# Patient Record
Sex: Female | Born: 1950 | Race: White | Hispanic: No | Marital: Married | Smoking: Former smoker
Health system: Southern US, Community
[De-identification: ages and names within clinical notes are randomized; demographics above are authoritative.]

## PROBLEM LIST (undated history)

## (undated) DIAGNOSIS — M5136 Other intervertebral disc degeneration, lumbar region: Secondary | ICD-10-CM

## (undated) DIAGNOSIS — R5382 Chronic fatigue, unspecified: Secondary | ICD-10-CM

## (undated) DIAGNOSIS — M1990 Unspecified osteoarthritis, unspecified site: Secondary | ICD-10-CM

## (undated) DIAGNOSIS — G8929 Other chronic pain: Secondary | ICD-10-CM

## (undated) DIAGNOSIS — F419 Anxiety disorder, unspecified: Secondary | ICD-10-CM

## (undated) DIAGNOSIS — I959 Hypotension, unspecified: Secondary | ICD-10-CM

## (undated) DIAGNOSIS — M545 Low back pain, unspecified: Secondary | ICD-10-CM

## (undated) DIAGNOSIS — Z973 Presence of spectacles and contact lenses: Secondary | ICD-10-CM

## (undated) DIAGNOSIS — I219 Acute myocardial infarction, unspecified: Secondary | ICD-10-CM

## (undated) DIAGNOSIS — K219 Gastro-esophageal reflux disease without esophagitis: Secondary | ICD-10-CM

## (undated) DIAGNOSIS — Z9289 Personal history of other medical treatment: Secondary | ICD-10-CM

## (undated) DIAGNOSIS — M47812 Spondylosis without myelopathy or radiculopathy, cervical region: Secondary | ICD-10-CM

## (undated) DIAGNOSIS — I679 Cerebrovascular disease, unspecified: Secondary | ICD-10-CM

## (undated) DIAGNOSIS — M51369 Other intervertebral disc degeneration, lumbar region without mention of lumbar back pain or lower extremity pain: Secondary | ICD-10-CM

## (undated) DIAGNOSIS — G894 Chronic pain syndrome: Secondary | ICD-10-CM

## (undated) DIAGNOSIS — I2510 Atherosclerotic heart disease of native coronary artery without angina pectoris: Secondary | ICD-10-CM

## (undated) DIAGNOSIS — R079 Chest pain, unspecified: Principal | ICD-10-CM

## (undated) DIAGNOSIS — G909 Disorder of the autonomic nervous system, unspecified: Secondary | ICD-10-CM

## (undated) DIAGNOSIS — E785 Hyperlipidemia, unspecified: Secondary | ICD-10-CM

## (undated) DIAGNOSIS — Z8719 Personal history of other diseases of the digestive system: Secondary | ICD-10-CM

## (undated) DIAGNOSIS — R7303 Prediabetes: Secondary | ICD-10-CM

## (undated) HISTORY — DX: Spondylosis without myelopathy or radiculopathy, cervical region: M47.812

## (undated) HISTORY — DX: Chronic pain syndrome: G89.4

## (undated) HISTORY — PX: LUMBAR LAMINECTOMY: SHX95

## (undated) HISTORY — DX: Hyperlipidemia, unspecified: E78.5

## (undated) HISTORY — DX: Hypotension, unspecified: I95.9

## (undated) HISTORY — DX: Disorder of the autonomic nervous system, unspecified: G90.9

## (undated) HISTORY — PX: FINGER ARTHROPLASTY: SHX5017

## (undated) HISTORY — DX: Unspecified osteoarthritis, unspecified site: M19.90

## (undated) HISTORY — DX: Other intervertebral disc degeneration, lumbar region without mention of lumbar back pain or lower extremity pain: M51.369

## (undated) HISTORY — DX: Other intervertebral disc degeneration, lumbar region: M51.36

## (undated) HISTORY — DX: Cerebrovascular disease, unspecified: I67.9

## (undated) HISTORY — PX: BACK SURGERY: SHX140

## (undated) HISTORY — PX: CORONARY ANGIOPLASTY WITH STENT PLACEMENT: SHX49

## (undated) HISTORY — PX: TYMPANOPLASTY: SHX33

---
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
## 1972-04-14 HISTORY — PX: PARTIAL HYSTERECTOMY: SHX80

## 1976-04-14 HISTORY — PX: BILATERAL SALPINGOOPHORECTOMY: SHX1223

## 1988-12-13 HISTORY — PX: LIGAMENT REPAIR: SHX5444

## 1988-12-13 HISTORY — PX: GANGLION CYST EXCISION: SHX1691

## 1988-12-13 HISTORY — PX: MASS EXCISION: SHX2000

## 1989-08-12 HISTORY — PX: CHOLECYSTECTOMY: SHX55

## 1997-04-14 DIAGNOSIS — I214 Non-ST elevation (NSTEMI) myocardial infarction: Secondary | ICD-10-CM

## 1997-04-14 HISTORY — DX: Non-ST elevation (NSTEMI) myocardial infarction: I21.4

## 1997-08-01 ENCOUNTER — Inpatient Hospital Stay (HOSPITAL_COMMUNITY): Admission: EM | Admit: 1997-08-01 | Discharge: 1997-08-03 | Payer: Self-pay | Admitting: Emergency Medicine

## 1997-08-02 DIAGNOSIS — I219 Acute myocardial infarction, unspecified: Secondary | ICD-10-CM

## 1997-08-02 HISTORY — DX: Acute myocardial infarction, unspecified: I21.9

## 1997-08-25 ENCOUNTER — Other Ambulatory Visit: Admission: RE | Admit: 1997-08-25 | Discharge: 1997-08-25 | Payer: Self-pay | Admitting: Otolaryngology

## 1997-09-11 ENCOUNTER — Inpatient Hospital Stay (HOSPITAL_COMMUNITY): Admission: EM | Admit: 1997-09-11 | Discharge: 1997-09-12 | Payer: Self-pay | Admitting: Emergency Medicine

## 1998-12-14 HISTORY — PX: CARDIAC CATHETERIZATION: SHX172

## 1999-06-26 ENCOUNTER — Inpatient Hospital Stay (HOSPITAL_COMMUNITY): Admission: EM | Admit: 1999-06-26 | Discharge: 1999-06-28 | Payer: Self-pay | Admitting: Emergency Medicine

## 1999-06-26 ENCOUNTER — Encounter: Payer: Self-pay | Admitting: Emergency Medicine

## 1999-06-27 ENCOUNTER — Encounter: Payer: Self-pay | Admitting: Cardiology

## 1999-08-06 ENCOUNTER — Emergency Department (HOSPITAL_COMMUNITY): Admission: EM | Admit: 1999-08-06 | Discharge: 1999-08-06 | Payer: Self-pay | Admitting: Emergency Medicine

## 1999-08-06 ENCOUNTER — Encounter: Payer: Self-pay | Admitting: Emergency Medicine

## 2000-07-08 ENCOUNTER — Ambulatory Visit (HOSPITAL_COMMUNITY): Admission: RE | Admit: 2000-07-08 | Discharge: 2000-07-08 | Payer: Self-pay | Admitting: Cardiology

## 2001-06-06 ENCOUNTER — Emergency Department (HOSPITAL_COMMUNITY): Admission: EM | Admit: 2001-06-06 | Discharge: 2001-06-06 | Payer: Self-pay | Admitting: Emergency Medicine

## 2001-06-30 ENCOUNTER — Encounter: Admission: RE | Admit: 2001-06-30 | Discharge: 2001-06-30 | Payer: Self-pay | Admitting: Neurosurgery

## 2001-06-30 ENCOUNTER — Encounter: Payer: Self-pay | Admitting: Neurosurgery

## 2001-07-26 ENCOUNTER — Encounter: Payer: Self-pay | Admitting: Neurosurgery

## 2001-07-26 ENCOUNTER — Encounter: Admission: RE | Admit: 2001-07-26 | Discharge: 2001-07-26 | Payer: Self-pay | Admitting: Neurosurgery

## 2001-08-10 ENCOUNTER — Encounter: Payer: Self-pay | Admitting: Neurosurgery

## 2001-08-10 ENCOUNTER — Encounter: Admission: RE | Admit: 2001-08-10 | Discharge: 2001-08-10 | Payer: Self-pay | Admitting: Neurosurgery

## 2001-08-31 ENCOUNTER — Encounter: Payer: Self-pay | Admitting: Neurosurgery

## 2001-08-31 ENCOUNTER — Encounter: Admission: RE | Admit: 2001-08-31 | Discharge: 2001-08-31 | Payer: Self-pay | Admitting: Neurosurgery

## 2002-02-12 HISTORY — PX: HERNIA REPAIR: SHX51

## 2002-02-14 ENCOUNTER — Encounter (HOSPITAL_BASED_OUTPATIENT_CLINIC_OR_DEPARTMENT_OTHER): Payer: Self-pay | Admitting: General Surgery

## 2002-02-17 ENCOUNTER — Inpatient Hospital Stay (HOSPITAL_COMMUNITY): Admission: RE | Admit: 2002-02-17 | Discharge: 2002-02-18 | Payer: Self-pay | Admitting: General Surgery

## 2002-04-14 HISTORY — PX: POSTERIOR LUMBAR FUSION: SHX6036

## 2002-05-11 ENCOUNTER — Inpatient Hospital Stay (HOSPITAL_COMMUNITY): Admission: RE | Admit: 2002-05-11 | Discharge: 2002-05-16 | Payer: Self-pay | Admitting: Orthopaedic Surgery

## 2002-05-11 ENCOUNTER — Encounter: Payer: Self-pay | Admitting: Orthopaedic Surgery

## 2003-12-22 ENCOUNTER — Ambulatory Visit (HOSPITAL_COMMUNITY): Admission: RE | Admit: 2003-12-22 | Discharge: 2003-12-22 | Payer: Self-pay | Admitting: Cardiology

## 2004-08-05 ENCOUNTER — Emergency Department (HOSPITAL_COMMUNITY): Admission: EM | Admit: 2004-08-05 | Discharge: 2004-08-05 | Payer: Self-pay | Admitting: Family Medicine

## 2004-12-13 ENCOUNTER — Emergency Department (HOSPITAL_COMMUNITY): Admission: EM | Admit: 2004-12-13 | Discharge: 2004-12-13 | Payer: Self-pay | Admitting: Emergency Medicine

## 2006-03-10 ENCOUNTER — Emergency Department (HOSPITAL_COMMUNITY): Admission: EM | Admit: 2006-03-10 | Discharge: 2006-03-10 | Payer: Self-pay | Admitting: Emergency Medicine

## 2006-11-21 ENCOUNTER — Emergency Department (HOSPITAL_COMMUNITY): Admission: EM | Admit: 2006-11-21 | Discharge: 2006-11-21 | Payer: Self-pay | Admitting: Family Medicine

## 2008-10-17 ENCOUNTER — Observation Stay (HOSPITAL_COMMUNITY): Admission: EM | Admit: 2008-10-17 | Discharge: 2008-10-18 | Payer: Self-pay | Admitting: Emergency Medicine

## 2008-10-17 ENCOUNTER — Encounter (INDEPENDENT_AMBULATORY_CARE_PROVIDER_SITE_OTHER): Payer: Self-pay | Admitting: Emergency Medicine

## 2008-10-31 ENCOUNTER — Inpatient Hospital Stay (HOSPITAL_COMMUNITY): Admission: EM | Admit: 2008-10-31 | Discharge: 2008-11-04 | Payer: Self-pay | Admitting: Emergency Medicine

## 2008-11-03 ENCOUNTER — Encounter (INDEPENDENT_AMBULATORY_CARE_PROVIDER_SITE_OTHER): Payer: Self-pay | Admitting: Cardiology

## 2009-03-20 ENCOUNTER — Encounter: Admission: RE | Admit: 2009-03-20 | Discharge: 2009-04-11 | Payer: Self-pay | Admitting: Endocrinology

## 2009-04-05 ENCOUNTER — Ambulatory Visit (HOSPITAL_BASED_OUTPATIENT_CLINIC_OR_DEPARTMENT_OTHER): Admission: RE | Admit: 2009-04-05 | Discharge: 2009-04-05 | Payer: Self-pay | Admitting: Orthopedic Surgery

## 2009-04-14 HISTORY — PX: GUM SURGERY: SHX658

## 2009-09-10 ENCOUNTER — Emergency Department (HOSPITAL_COMMUNITY): Admission: EM | Admit: 2009-09-10 | Discharge: 2009-09-11 | Payer: Self-pay | Admitting: Emergency Medicine

## 2010-07-15 LAB — BASIC METABOLIC PANEL
BUN: 15 mg/dL (ref 6–23)
CO2: 29 mEq/L (ref 19–32)
Calcium: 9.6 mg/dL (ref 8.4–10.5)
Chloride: 102 mEq/L (ref 96–112)
Creatinine, Ser: 0.82 mg/dL (ref 0.4–1.2)
GFR calc Af Amer: >60 mL/min (ref >60)
GFR calc non Af Amer: >60 mL/min (ref >60)
Glucose, Bld: 122 mg/dL — ABNORMAL HIGH (ref 70–99)
Potassium: 4.8 mEq/L (ref 3.5–5.1)
Sodium: 138 mEq/L (ref 135–145)

## 2010-07-15 LAB — POCT HEMOGLOBIN-HEMACUE: Hemoglobin: 11.9 g/dL — ABNORMAL LOW (ref 12.0–15.0)

## 2010-07-21 LAB — CARDIAC PANEL(CRET KIN+CKTOT+MB+TROPI)
CK, MB: 1.1 ng/mL (ref 0.3–4.0)
Relative Index: INVALID (ref 0.0–2.5)
Relative Index: INVALID (ref 0.0–2.5)
Total CK: 74 U/L (ref 7–177)
Troponin I: 0.01 ng/mL (ref 0.00–0.06)

## 2010-07-21 LAB — GLUCOSE, CAPILLARY
Glucose-Capillary: 102 mg/dL — ABNORMAL HIGH (ref 70–99)
Glucose-Capillary: 110 mg/dL — ABNORMAL HIGH (ref 70–99)
Glucose-Capillary: 110 mg/dL — ABNORMAL HIGH (ref 70–99)
Glucose-Capillary: 111 mg/dL — ABNORMAL HIGH (ref 70–99)
Glucose-Capillary: 115 mg/dL — ABNORMAL HIGH (ref 70–99)
Glucose-Capillary: 123 mg/dL — ABNORMAL HIGH (ref 70–99)
Glucose-Capillary: 124 mg/dL — ABNORMAL HIGH (ref 70–99)
Glucose-Capillary: 137 mg/dL — ABNORMAL HIGH (ref 70–99)
Glucose-Capillary: 140 mg/dL — ABNORMAL HIGH (ref 70–99)
Glucose-Capillary: 158 mg/dL — ABNORMAL HIGH (ref 70–99)
Glucose-Capillary: 162 mg/dL — ABNORMAL HIGH (ref 70–99)
Glucose-Capillary: 85 mg/dL (ref 70–99)
Glucose-Capillary: 90 mg/dL (ref 70–99)
Glucose-Capillary: 91 mg/dL (ref 70–99)

## 2010-07-21 LAB — DIFFERENTIAL
Basophils Absolute: 0 10*3/uL (ref 0.0–0.1)
Basophils Relative: 0 % (ref 0–1)
Basophils Relative: 1 % (ref 0–1)
Eosinophils Absolute: 0.3 10*3/uL (ref 0.0–0.7)
Eosinophils Absolute: 0.4 10*3/uL (ref 0.0–0.7)
Eosinophils Relative: 4 % (ref 0–5)
Eosinophils Relative: 4 % (ref 0–5)
Lymphocytes Relative: 20 % (ref 12–46)
Lymphs Abs: 1.9 10*3/uL (ref 0.7–4.0)
Monocytes Absolute: 0.5 10*3/uL (ref 0.1–1.0)
Monocytes Relative: 7 % (ref 3–12)
Neutro Abs: 3.9 10*3/uL (ref 1.7–7.7)
Neutrophils Relative %: 70 % (ref 43–77)

## 2010-07-21 LAB — LIPID PANEL
Cholesterol: 135 mg/dL (ref 0–200)
HDL: 68 mg/dL (ref >39)
LDL Cholesterol: 55 mg/dL (ref 0–99)

## 2010-07-21 LAB — COMPREHENSIVE METABOLIC PANEL
ALT: 20 U/L (ref 0–35)
ALT: 22 U/L (ref 0–35)
AST: 27 U/L (ref 0–37)
AST: 29 U/L (ref 0–37)
Albumin: 4.1 g/dL (ref 3.5–5.2)
Alkaline Phosphatase: 65 U/L (ref 39–117)
Alkaline Phosphatase: 76 U/L (ref 39–117)
BUN: 11 mg/dL (ref 6–23)
CO2: 30 mEq/L (ref 19–32)
Chloride: 100 mEq/L (ref 96–112)
Chloride: 103 mEq/L (ref 96–112)
Creatinine, Ser: 0.72 mg/dL (ref 0.4–1.2)
GFR calc Af Amer: >60 mL/min (ref >60)
GFR calc non Af Amer: >60 mL/min (ref >60)
GFR calc non Af Amer: >60 mL/min (ref >60)
Glucose, Bld: 47 mg/dL — ABNORMAL LOW (ref 70–99)
Potassium: 4 mEq/L (ref 3.5–5.1)
Potassium: 4.8 mEq/L (ref 3.5–5.1)
Total Bilirubin: 0.5 mg/dL (ref 0.3–1.2)
Total Protein: 6.8 g/dL (ref 6.0–8.3)

## 2010-07-21 LAB — CBC
HCT: 38.9 % (ref 36.0–46.0)
MCHC: 33.6 g/dL (ref 30.0–36.0)
RBC: 4.43 MIL/uL (ref 3.87–5.11)
RDW: 13.9 % (ref 11.5–15.5)
WBC: 9.6 10*3/uL (ref 4.0–10.5)

## 2010-07-21 LAB — BASIC METABOLIC PANEL
Chloride: 104 mEq/L (ref 96–112)
GFR calc Af Amer: >60 mL/min (ref >60)
GFR calc non Af Amer: >60 mL/min (ref >60)
Potassium: 4.1 mEq/L (ref 3.5–5.1)
Sodium: 143 mEq/L (ref 135–145)

## 2010-07-21 LAB — URINALYSIS, ROUTINE W REFLEX MICROSCOPIC
Bilirubin Urine: NEGATIVE
Glucose, UA: NEGATIVE mg/dL

## 2010-07-21 LAB — T3: T3, Total: 114.6 ng/dl (ref 80.0–204.0)

## 2010-07-21 LAB — THYROID ANTIBODIES: Thyroperoxidase Ab SerPl-aCnc: 34.1 U/mL (ref 0.0–60.0)

## 2010-07-21 LAB — POCT CARDIAC MARKERS
Myoglobin, poc: 75.4 ng/mL (ref 12–200)
Troponin i, poc: <0.05 ng/mL (ref 0.00–0.09)

## 2010-07-21 LAB — CORTISOL: Cortisol, Plasma: 14.4 ug/dL

## 2010-07-21 LAB — T4: T4, Total: 7.2 ug/dL (ref 5.0–12.5)

## 2010-07-21 LAB — HEMOGLOBIN A1C: Hgb A1c MFr Bld: 6.2 % — ABNORMAL HIGH (ref 4.6–6.1)

## 2010-07-21 LAB — CORTISOL-AM, BLOOD: Cortisol - AM: 7.5 ug/dL (ref 4.3–22.4)

## 2010-07-21 LAB — TSH: TSH: 9.558 u[IU]/mL — ABNORMAL HIGH (ref 0.350–4.500)

## 2010-07-21 LAB — T3, FREE: T3, Free: 3.2 pg/mL (ref 2.3–4.2)

## 2010-07-21 LAB — T4, FREE: Free T4: 0.91 ng/dL (ref 0.80–1.80)

## 2010-07-22 LAB — COMPREHENSIVE METABOLIC PANEL
BUN: 9 mg/dL (ref 6–23)
Calcium: 10.1 mg/dL (ref 8.4–10.5)
Glucose, Bld: 88 mg/dL (ref 70–99)
Total Protein: 7.8 g/dL (ref 6.0–8.3)

## 2010-07-22 LAB — CBC
HCT: 40.8 % (ref 36.0–46.0)
Hemoglobin: 12.7 g/dL (ref 12.0–15.0)
Hemoglobin: 13.7 g/dL (ref 12.0–15.0)
MCV: 87.8 fL (ref 78.0–100.0)
RBC: 4.23 MIL/uL (ref 3.87–5.11)
RDW: 13.8 % (ref 11.5–15.5)
WBC: 6 10*3/uL (ref 4.0–10.5)

## 2010-07-22 LAB — DIFFERENTIAL
Lymphs Abs: 2.6 10*3/uL (ref 0.7–4.0)
Monocytes Relative: 7 % (ref 3–12)
Neutro Abs: 2.7 10*3/uL (ref 1.7–7.7)
Neutrophils Relative %: 45 % (ref 43–77)

## 2010-07-22 LAB — TSH: TSH: 2.566 u[IU]/mL (ref 0.350–4.500)

## 2010-07-22 LAB — CK TOTAL AND CKMB (NOT AT ARMC)
Relative Index: INVALID (ref 0.0–2.5)
Total CK: 67 U/L (ref 7–177)

## 2010-07-22 LAB — BRAIN NATRIURETIC PEPTIDE: Pro B Natriuretic peptide (BNP): <30 pg/mL (ref 0.0–100.0)

## 2010-07-22 LAB — PROTIME-INR: INR: 1 (ref 0.00–1.49)

## 2010-07-22 LAB — APTT: aPTT: 33 seconds (ref 24–37)

## 2010-07-22 LAB — CARDIAC PANEL(CRET KIN+CKTOT+MB+TROPI)
CK, MB: 1 ng/mL (ref 0.3–4.0)
CK, MB: 1.1 ng/mL (ref 0.3–4.0)
Total CK: 74 U/L (ref 7–177)

## 2010-08-27 NOTE — Consult Note (Signed)
[name], Briana Bowers NO.:  [id number]   MEDICAL RECORD NO.:  [id number]          PATIENT TYPE:  INP   LOCATION:  3701                         FACILITY:  [facility]   PHYSICIAN:  Francisca December, M.D.  DATE OF BIRTH:  02/27/1951   DATE OF CONSULTATION:  10/31/2008  DATE OF DISCHARGE:                                 CONSULTATION   REASON FOR CONSULTATION:  Chest pain and palpitations.   FINDINGS:  1. Chest pain, long history of angina with normal coronary artery      syndrome.  2. Remote history of CAD, status post right coronary stent in 1999.  3. No significant obstructive CAD by cardiac cath on October 18, 2008.  4. Hypotension and nitrate-induced syncope.  5. Hypoglycemia.  6. Anemia.  7. DJD, sciatic nerve injury, chronic low back pain, and      osteoarthritis.  8. Chronic fatigue and anxiety.  9. History of chronic inappropriate sinus tachycardia, treated with      high-dose beta blocker.   RECOMMENDATIONS:  1. The patient has not had any evidence of an acute coronary syndrome.      As mentioned, the cardiac catheterization performed less than 3      weeks ago showed no significant obstructive disease.  Two sets of      cardiac enzymes have been negative.  She does have a long history      of angina without ischemic coronary findings and this has been      treated with p.r.n. sublingual nitroglycerin and nortriptyline      chronically.  2. Agree with your plans for evaluation with the HPA axis given the      history of hypoglycemia, hypertension, and fatigue.  3. We will monitor blood pressures here in the hospital and reinitiate      her beta-blocker therapy as able.  4. The patient does drink up to 6 liters of fluid daily in an attempt      to remain hydrated.  I am concerned about the possibility of      chronic hyponatremia, although this is certainly not demonstrated      today.  She may benefit from a salt water retention agent such as  fludrocortisone or perhaps midodrine for chronic intermittent      hypotension.   HISTORY OF PRESENT ILLNESS:  Briana Bowers is a 60 year old woman, who is  a long patient of mine, admitted with a syncopal episode after taking  nitroglycerin for chest pain.  She had noted her blood pressure to be in  the 80-90 range earlier in the day.  After the nitroglycerin, fell to 70  and she subsequently passed out briefly.  She was brought to [facility] ER and  admitted.  At the time of my evaluation, she has no complaints other  than fatigue.  She notes that her episodes of chest pain are fairly rare  and have been generally well controlled with nortriptyline.  She has had  documented episodes of hypoglycemia, found to be as low as 47 and the  previously mentioned hypotension, which  she treats with large amounts of  p.o. liquids, predominately water.   She has a long history of palpitation secondary to inappropriate sinus  tachycardia and this has been treated with 10 mg of bisoprolol given up  to 4 times daily on a p.r.n. basis.   PAST MEDICAL HISTORY:  As above.   CURRENT MEDICATIONS:  1. Protonix 40 mg p.o. daily.  2. Alprazolam 0.5 mg p.o. nightly.  3. Alprazolam 0.25 mg p.o. b.i.d.  4. Nortriptyline 50 mg p.o. daily.  5. Citalopram 20 mg p.o. daily.  6. Crestor 20 mg p.o. daily.  7. Coated aspirin 325 mg p.o. daily.  8. Singulair 10 mg p.o. daily.  9. Mobic 15 mg p.o. nightly.  10.Estradiol, testosterone, progesterone cream daily.  11.Calcium and vitamin D.  12.Omega 3.  13.Coenzyme Q10.  14.Liver antioxidant.  15.Glucosamine.  16.Vitamin B complex.   DRUG ALLERGIES:  THORAZINE and VISTARIL.   SOCIAL HISTORY:  No use of alcohol or tobacco.  She lives independently  and travels frequently to [place] to visit with her husband.   REVIEW OF SYSTEMS:  Negative except as mentioned above.   PHYSICAL EXAMINATION:  VITAL SIGNS:  Blood pressure 122/72, pulse 94 and  regular, respiratory  rate 20, temperature 98.3, O2 saturation on 2  liters is 97%.  GENERAL:  This is an anxious, alopetic Caucasian female in no distress.  HEENT:  Unremarkable.  Head is atraumatic and normocephalic.  The pupils  are equal, round, and reactive to light.  Sclerae are anicteric.  Oral  mucosa is pink and moist.  Teeth and gums in good repair.  Tongue is not  coated.  NECK:  Supple without thyromegaly or masses.  The carotid upstrokes are  normal.  There is no bruit.  There is no jugular venous distention.  CHEST:  Clear with adequate excursion.  No wheezes, rales, or rhonchi.  HEART:  Slightly tachycardic rhythm.  Normal S1 and S2 is heard.  No S3,  S4, murmur, click, or rub noted.  ABDOMEN:  Soft and nontender.  No hepatosplenomegaly or midline  pulsatile mass.  EXTERNAL GENITALIA:  Without lesions.  RECTAL:  Not performed.  EXTREMITIES:  Full range of motion.  No edema.  Intact distal pulses.  NEUROLOGIC:  Cranial nerves II through XII intact.  Motor and sensory  grossly intact.  Gait not tested.  SKIN:  Warm, dry, and clear.   Admission cardiogram showed sinus tachycardia.  Chest x-ray showed no  acute abnormality.   The CT angio of the chest obtained because of syncope is negative for  pulmonary embolism and no acute abnormality.   Other admission laboratory including a CBC, serum electrolytes, BUN,  creatinine, glucose all within normal limits.  Liver-associated enzymes  normal.  Hemoglobin A1c is 6.2.  As mentioned, CK-MB and troponin have  been negative x3.  Cholesterol total is 135, LDL 55, and urinalysis is  clean.   COMMENTS:  Difficult case to manage.  I agree with planned endocrine  evaluation.  I think this is very important given her overall  presentation and complex management path.  Her blood pressure has  improved here in the hospital  such that she might tolerate reinitiation of a beta-blocker; however, it  is unclear what is causing these hypotensive episodes as an  outpatient  and perhaps mineralocorticoid administration is our only option.  We  will have to see what the endocrine evaluation reveals prior to this.      Francisca December,  M.D.  Electronically Signed     JHE/MEDQ  D:  10/31/2008  T:  11/01/2008  Job:  [id number]

## 2010-08-27 NOTE — H&P (Signed)
Briana Bowers                 ACCOUNT NO.:  [id number]   MEDICAL RECORD NO.:  [id number]          PATIENT TYPE:  INP   LOCATION:  3701                         FACILITY:  [facility]   PHYSICIAN:  Briana Hillock, MD        DATE OF BIRTH:  [birth year]   DATE OF ADMISSION:  10/30/2008  DATE OF DISCHARGE:                              HISTORY & PHYSICAL   CHIEF COMPLAINT:  Chest pain/syncope.   HISTORY OF PRESENT ILLNESS:  The patient is a 60 year old Caucasian  female who presented to the emergency room with a chief complaint of  chest pain.  Apparently, her pain started around 6:30 p.m. yesterday,  described by her as localized to the left side of his chest radiating to  her left arm.  She took a tablet of nitroglycerin, had some nausea and  associated shortness of breath and stated that she had palpitations,  which she describes as her heart rate got heavy.  The patient states  that she went to take a shower after she took the nitroglycerin and  passed out and noted a blood pressure was 75/50 and a heart rate of 120  prior to passing out.   ER COURSE:  The patient had initial evaluation in the ER, which included  an EKG that showed normal sinus rhythm, normal axis, and a possible left  atrial atrophy, no significant changes compared with previous EKGs;  however, the patient was noted to be hypoglycemic with a blood sugar of  47 on arrival.  Cardiology was consulted and they recommended admission  to the Hospitalist Service.  At the time of interview, the patient  denies any chest pain, but seems to be overly worried about her blood  pressure dropping because of her bisoprolol that she takes for  palpitations.Marland Kitchen   PAST MEDICAL HISTORY:  1. Hyperlipidemia.  2. Asthma.  3. Anxiety/depression.  4. Chronic fatigue syndrome.  5. Osteoarthritis.  6. Coronary artery disease status post stent placement.  7. 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   PAST  SURGICAL HISTORY:  History of cardiac stenting x1.   FAMILY HISTORY:  Significant for coronary artery disease.   SOCIAL HISTORY:  No history of alcohol, tobacco, or drug use.  The  patient is independent in all her ADLs.   CURRENT MEDICATIONS:  1. Protonix.  2. Xanax.  3. Crestor.  4. Singulair.  5. Celexa.  6. Aspirin.  7. Nortriptyline.  8. Bisoprolol fumarate, which the patient has not taken in the last 24      hours.   ALLERGIES:  THORAZINE and VISTARIL.   REVIEW OF SYSTEMS:  An extensive review of systems was done.  All  systems are negative except for the positives mentioned in the history  of present illness.   PHYSICAL EXAMINATION:  VITALS:  On admission, pulse 94, blood pressure  122/72, respiratory rate of 20, temperature 98.3, O2 sats are 97% on  room air.  GENERAL:  Anxious Caucasian female in no acute distress at the time of  this interview, alert, oriented  to time, place, and person, appearing  older than her stated age.  HEENT:  No scleral icterus.  No pallor.  Ears negative.  Poor dental  hygiene.  NECK:  Supple.  No lymphadenopathy.  No JVD.  No carotid bruit.  CHEST:  Breath sounds heard bilaterally.  Good air entry.  No added  sounds.  CVS:  S1 and S2 plus, regular.  No gallop or rub.  ABDOMEN:  Soft and nontender.  Bowel sounds present.  EXTREMITIES:  No cyanosis, clubbing, or edema.  NEUROLOGIC:  Cranial nerves II through XII are grossly intact.  No focal  motor or sensory deficits noted on gross examination.   LABORATORY DATA:  Her chest x-ray showed no acute abnormality.  CK was  75.4, MB less than 1, troponin less than 0.05.  WBC count 9.6,  hemoglobin 13.1, hematocrit 38.9, platelet count of 300.  Her sodium was  138, potassium 4.8, chloride 100, CO2 of 30, BUN 12, creatinine 0.72,  glucose of 47.  Urinalysis was negative.  Repeat glucose of 167.  EKG  showed normal sinus rhythm, normal axis, normal PR interval, left atrial  hypertrophy, no  significant changes compared with previous EKGs.   IMPRESSION AND PLAN:  This is a case of a 60 year old Caucasian female  with known history of coronary artery disease who presents with atypical  chest pain, syncopal episode with documented hypotension, and was noted  to be hypoglycemic upon arrival.  1. Chest pain.  Given her significant past medical history, we will      admit her to telemetry for overnight observation, recycle enzymes,      and Cardiology, Dr. Maryla Bowers material was consulted by the ER      physician, but wants to be reconsulted in the morning.  Based on      her history, she probably needs more cardioselective beta-blocker      for her heart rate control if at all needed as most of her symptoms      appear to be anxiety related at this time.  2. Syncope likely secondary to hypoglycemia.  The patient as mentioned      above had significantly low blood sugar.  Given her cardiac      history, we will monitor her on telemetry overnight.  We will also      check orthostatic blood pressures in the morning and follow up.  3. Hypoglycemia, etiology is likely decreased p.o. intake; however,      there is remote possibility of iatrogenic use of oral hypoglycemics      as the patient clearly states that her husband has history of      diabetes mellitus and is taking oral hypoglycemics at this time.      At this time, we will not pursue this any further unless and until      she ends up having recurrent hypoglycemic episodes.  4. Anxiety/depression.  Continue Xanax and Celexa.  There is strong      possibility that most of her symptoms at      this time are related to anxiety.  5. DVT/GI prophylaxis.  Protonix and subcu heparin.  6. Asthma, stable.  Use Xopenex nebs on a p.r.n. basis.      Briana Hillock, MD  Electronically Signed     BA/MEDQ  D:  10/31/2008  T:  10/31/2008  Job:  [id number]   cc:   Briana Bowers, M.D.

## 2010-08-30 NOTE — Op Note (Signed)
Briana Bowers, Briana Bowers                           ACCOUNT NO.:  [id number]   MEDICAL RECORD NO.:  [id number]                   PATIENT TYPE:  INP   LOCATION:  2550                                 FACILITY:  [facility]   PHYSICIAN:  Sharolyn Douglas, M.D.                     DATE OF BIRTH:  [birth year]   DATE OF PROCEDURE:  05/11/2002  DATE OF DISCHARGE:                                 OPERATIVE REPORT   PREOPERATIVE DIAGNOSIS:  1. Degenerative spondylolisthesis L4-5 with back and bilateral lower     extremity pain.  2. Degenerative disk disease L5-S1 status post lumbar laminotomy x2.   PROCEDURE:  1. Revision of lumbar laminotomy with decompression of the L5 and S1 nerve     roots bilaterally.  2. Transforaminal lumbar interbody fusion L4-5 with placement of a 10 mm     Nuvasive Allograft prosthesis spacer.  3. Segmental pedicle screw instrumentation L4 to the sacrum.  4. Posterior spinal arthrodesis L4 to the sacrum.  5. Local autogenous bone grafting supplemented with EBI Stalite bone graft     substitute.  6. Neuro monitoring utilizing triggered and free running EMG's.   SURGEON:  Sharolyn Douglas, M.D.   ASSISTANT:  Verlin Fester, P.A.   ANESTHESIA:  General endotracheal.   COMPLICATIONS:  None.   OPERATIVE TIME:  Four hours.   INDICATIONS FOR PROCEDURE:  The patient is a 60 year old female with chronic  progressive back and bilateral lower extremity pain right greater than left.  She is status post lumbar laminotomy at L4 and L5 x2.  Plain radiographs  show a spondylolisthesis at L4-5 that reduces on extension.  There is severe  disk space narrowing at L5-S1. MRI and CT myelogram shows lateral recess and  foraminal narrowing. The risks, benefits, and alternatives to revision,  decompression, and fusion were reviewed with the patient. She elected to  proceed in hopes of improving her symptoms.   DESCRIPTION OF PROCEDURE:  The patient was properly identified in the  holding area and  taken to the operating room. She underwent general  endotracheal anesthesia without difficulty.  EMG leads were placed for  monitoring of free running and triggered EMG's utilizing the neuro vision  neuro monitoring system.  Prophylactic antibiotics were given.  She was  turned prone to the [facility] four poster positioning frame.  All bony  prominences were padded.  Face and eyes were protected at all times. The  back was prepped and draped in the usual sterile fashion.  The previous  incision midline was utilized and extended several cm proximally and  distally.  Dissection was carried down to the deep fascia, deep fascia was  incised, and paraspinal muscles stripped out to the tips of the transverse  processes of L4, L5, and the sacral area bilaterally.  Great care was taken  at the L4-5 and L5-S1 interspaces to avoid entering the  previous laminotomy  defects.  There was epidural fibrosis encountered.  There was a facetectomy  at L5-S1 with previous resection of the left inferior facet as well as the  superior facet of the sacrum. The L4-5 facet joints were subluxated left  greater than right.  DD retractors were placed.  The edges of the previous  laminotomy defects were identified at L4-5 and L5-S1 using curets.  We then  entered the spinal canal and identified the S1 nerve roots bilaterally.  On  the left, there was epidural fibrosis about the root.  There had been a  previous foraminotomy performed on the left side and the S1 nerve root  appeared to be free out its foramen. We then identified the L5 nerve root on  the left by performing a facetectomy.  The lateral recess was decompressed  by removing the overhanging superior facet of L5.  The L5 nerve root  appeared to be compressed within the lateral recess secondary to  pseudobulging of the disk as well as the spondylolisthesis.  There was a  large facet cyst coming off of the L4-5 joint appearing to compress the  dural sac.  This  was all debrided. A similar procedure was carried out on  the right side.  On the right, we again identified the L5 and S1 nerve roots  and confirmed they were free of their respective foramen.  We then turned  our attention to performing a transforaminal lumbar interbody fusion on the  left at L4-5. The L5 root was gently retracted toward the midline. The  exiting L4 root was identified.  Annulotomy was performed.  Osteotome was  used to remove the overhanging lip of the L4 vertebral body. We then  specialized scrapers and curets to perform a radical diskectomy all the way  across the other side. The disk space was irrigated. Care was taken to  scrape the cartilaginous endplates.  We then distracted up to a 10 mm trial  and found this to be an excellent fit. We then tightly packed the disk space  with local autogenous bone that had been collected from the laminectomy and  cleaned of all soft tissue.  We then impacted a 10 mm Nuvasive Allograft  prosthesis spacer and kicked it across the midline.  We used fluoroscopy to  confirm good positioning of the graft in both the anterior and posterior  plains.  We then inspected the L4 and L5 nerve roots and there were no  apparent injuries.  At this point, attention was directed to placing pedicle  screws using an anatomic probing technique. Each pedicle screw hole was  identified anatomically initiated with an awl and then probed with the  Steffee pedicle probe.  Each hole was then palpated using a ball tip feeler  confirming there were no breeches.  The hole was intact and the screw was  placed. We placed screws at L4 and S1 on the left and L4-5 and S1 on the  right. L5 on the left was skipped because of the previous facetectomy and  the distorted anatomy. We had good purchase of all of the screws.  Each  screw was then tested using triggered EMG's and we had a response at greater than 20 milliamps on each screw indicating interosseous placement.   Free  running EMG's were monitored throughout the procedure including the T lift  and there were no deleterious changes.  We then decorticated the transverse  processes of L4, L5, and the sacral area  bilaterally using a highspeed bur.  The remaining local autogenous bone was impacted tightly into the lateral  gutters.  Rods were placed and the locking screws tightened.  Gentle  compression was applied across the L4-5 interspace where the T lift had been  performed.  We then placed 20 cc of EBI Stalite bone graft substitute over  the local autogenous grafting.  Deep drain was placed.  We once again  checked at the L5 and S1 nerve roots were free using a blunt probe.  Deep  fascia was closed with a running #1 Vicryl, subcutaneous layer closed with a  2-0 interrupted Vicryl followed by a running nylon on the skin. Sterile  dressing applied. The patient turned supine, extubated without difficulty,  and transferred to the recovery room, and able to move her upper and lower  extremities.                                               Sharolyn Douglas, M.D.    MC/MEDQ  D:  05/11/2002  T:  05/11/2002  Job:  [id number]

## 2010-08-30 NOTE — Discharge Summary (Signed)
NAMEDORE, OQUIN NO.:  [id number]   MEDICAL RECORD NO.:  [id number]                   PATIENT TYPE:  INP   LOCATION:  3301                                 FACILITY:  [facility]   PHYSICIAN:  Sharolyn Douglas, M.D.                     DATE OF BIRTH:  Sep 16, 1950   DATE OF ADMISSION:  05/11/2002  DATE OF DISCHARGE:  05/16/2002                                 DISCHARGE SUMMARY   ADMISSION DIAGNOSES:  1. Degenerative disc disease with spondylolisthesis and spinal stenosis.  2. Anxiety.  3. Asthma.  4. Atherosclerotic coronary artery disease, status post stent.  5. Hypercholesterolemia.  6. Chronic fatigue syndrome.  7. Osteoarthritis.   DISCHARGE DIAGNOSES:  1. Status post revision laminectomy L4 to S1, posterior spinal fusion L4 to     S1, doing well.  2. Postoperative hemorrhagic anemia that did require blood transfusion,     responded well.  3. Chest pain postoperatively that resolved after Dilaudid and nitroglycerin     as well as GI cocktail.  Did not have another occurrence throughout     hospital stay.  4. Elevated temperature postoperatively, resolved prior to discharge.  5. Degenerative disc disease with spondylolisthesis and spinal stenosis.  6. Anxiety.  7. Asthma.  8. Atherosclerotic coronary artery disease, status post stent.  9. Hypercholesterolemia.  10.      Chronic fatigue syndrome.  11.      Osteoarthritis.   CONSULTATIONS:  Dr. Meade Maw from cardiology.   PROCEDURE:  Revision laminectomy L4 to S1, posterior spinal fusion L4 to S1  with pedicle screws and __________ procedure at L4-5.  This was done by Dr.  Sharolyn Douglas with the assistance of [facility]   BRIEF HISTORY:  The patient is a 60 year old female with back and hip pain  and is referred back to [facility] for second opinion  by Dr. Nicanor Alcon.  She had a long history of back pain dated back to the  73s and early 1990s.  She is status post  L5-S1 discectomy in 1984 with  revision decompression in 1989.  She had been doing relatively well for many  years until January 2003, when she redeveloped back pain and radiation into  her right leg in L5 distribution.  Complains of numbness and burning,  describing the pain as an 8/10 on digital analog scale.  It keeps her up at  night.  She has failed numerous methods of conservative management,  interfering with activities of daily living, quality of life.  Risks and  benefits of proposed fusion was discussed with the patient by Dr. Noel Gerold.  The patient indicated understanding and opted to proceed.   LABORATORY DATA:  On May 03, 2002, CBC with differential was within  normal limits with the exception of red cell count of 3.54, hemoglobin 10,  hematocrit 30.3.  Hemoglobin and hematocrit were monitored throughout her  postoperative course.  It did reach a low of 6.9 on May 11, 2002,  postoperatively which did require a transfusion and on May 13, 2002,  back down to 8.2 requiring a second transfusion, responding well and pushing  up to 10.7 by May 14, 2002.  On discharge, hemoglobin and hematocrit  were 11.2 and 33.1.  PT/INR and PTT on admission were within normal limits.  CMET on May 11, 2002, was within normal limits with the exception of  albumin of 3.4.  Postoperatively she did have a mild hyponatremia of 133,  mild hypokalemia at 2.4.  These gradually increased and prior to discharge  sodium had increased to 134, potassium up to __________.7.  Glucose was  slightly elevated on one occasion on May 13, 2002, at 117.  BUN was 5  and 3, respectively on May 12, 2002, and May 13, 2002.  Cardiac  enzymes were 128 and 129, CK was 677, 877, and 790; CK-MB 16.1, second check  12.6 and third was 7.9.  Relative index was 2.4, 1.4, and 1.0.  UA from  May 11, 2002, was negative with the exception of hazy appearance with  moderate bilirubin, trace leukocyte  esterase and few epithelials.  Blood  typing from May 11, 2002, was type A, Rh positive and antibody screen  negative.   EKG from May 11, 2002, normal sinus rhythm __________.   X-rays from May 11, 2002, were used intraoperatively for localization.  Postoperatively, May 11, 2002, showed transarticular interbody fusion  and appears to be formed well from L5/S1.  These were the only x-ray reports  on the chart.   HOSPITAL COURSE:  On May 11, 2002, the patient was taken to the  operating room for the above listed procedure.  She tolerated the procedure  well without any intraoperative complications.  __________.  Hemovac drain  was placed.  She was transferred to the recovery room in stable condition.  Postoperatively pain control was obtained utilizing a combination of PCA as  well as p.o. analgesics.  She was transitioned over strictly over to p.o.  analgesics by postoperative day 2 to 3.  She did very well and neurovascular  checks were stable upon admission to the hospital and continued without any  problems.  Incentive spirometer was utilized to decrease any chances of  pulmonary complications.  Diet was held n.p.o. with the exception of sips  for medications __________ advance to regular diet without difficulty.  She  did have an episode of chest pain following surgery.  She was given Dilaudid  as well as nitroglycerin and GI cocktail which did resolve __________.  EKG  was unchanged.  Cardiac consult was ordered at that time and we appreciate  Dr. Meade Maw seeing the patient and __________ care of her throughout  her hospital stay. She did not have any cardiac complications.  She was  transfused on May 11, 2002, secondary to low hemoglobin as previously  dictated. She responded well and tolerated the procedure well.   By postoperative day #1, she was running a low grade temperature.  Physical therapy and occupational therapy began working with her on  that day and she  progressed well with them slowly throughout her hospital stay.   On postoperative day #2, her Hemovac drain was discontinued. Dressing was  changed.  The incision looked excellent without any signs or symptoms of  infection.  Daily dressing changes were done thereafter and she continued to  look good from that standpoint.   On May 13, 2002, her hemoglobin was low at 8.2 and she was transfused  per cardiac and neurology recommendations.  She tolerated the transfusion  well.  Her hemoglobin responded well at 10.7 the following day.  She  continued to progress with physical therapy and occupational therapy.   By May 16, 2002, the patient had met all orthopedic goals and medically  stable in cardiology.  She was ready for discharge home and care management  was consulted to help set up home health physical therapy and occupational  therapy through [place].  On May 16, 2002, vital signs are stable.  She  is afebrile.  Motor and sensory were intact and unchanged.  Incision looked  good without  evidence of infection.   She is status post L4-S1 posterior spinal fusion, doing well.   ACTIVITY:  Weightbearing as tolerated.  Progressive ambulation with brace on  when ambulating.  Daily dressing changes.  No lifting greater than five  pounds.  Back precautions were discussed and understood.  She will not  shower until postoperative day #5 or no drainage.   FOLLOW UP:  She will follow up in 10 to 14 days with Dr. Sharolyn Douglas, call for  appointment.   DISCHARGE MEDICATIONS:  Percocet, Norvasc, __________, laxatives as needed,  and Tylenol as needed for pain.   DIET:  Regular as tolerated.   CONDITION ON DISCHARGE:  Stable and improved.   DISPOSITION:  The patient is being discharged to her home with home health  physical therapy and occupational therapy.     Verlin Fester, P.A.                       Sharolyn Douglas, M.D.    CM/MEDQ  D:  07/13/2002  T:  07/13/2002   Job:  [id number]

## 2010-08-30 NOTE — Discharge Summary (Signed)
Briana Bowers, Briana Bowers                 ACCOUNT NO.:  [id number]   MEDICAL RECORD NO.:  [id number]          PATIENT TYPE:  INP   LOCATION:  3701                         FACILITY:  [facility]   PHYSICIAN:  Michelene Gardener, MD    DATE OF BIRTH:  01-25-1951   DATE OF ADMISSION:  10/30/2008  DATE OF DISCHARGE:  11/04/2008                               DISCHARGE SUMMARY   DISCHARGE DIAGNOSES:  1. Syncope, which is multifactorial mainly related to hypotension and      hypoglycemia.  2. Atypical chest pain.  3. Hypoglycemia secondary to poor oral intake without recurrence      during this hospitalization.  4. Hypotension.  5. Tachycardia.  6. Anxiety and depression.  7. Gastroesophageal reflux disease.  8. Hyperlipidemia.  9. History of asthma.  10.Chronic fatigue syndrome.  11.Osteoarthritis.  12.Coronary artery disease.  13.Degenerative disk disease.   DISCHARGE MEDICATIONS:  1. Protonix 40 mg p.o. once daily.  2. Xanax 0.5 mg p.o. as needed.  3. Crestor 10 mg once a day.  4. Singulair 10 mg once a day.  5. Celexa 20 mg once a day.  6. Aspirin 81 mg once a day.  7. Nortriptyline 25 mg once a day.   CONSULTATIONS:  Cardiology consult.   PROCEDURES:  None.   DIAGNOSTIC STUDIES:  1. Chest x-ray on July 19 showed no acute problem.  2. CT angiography on July 20 showed no evidence of pulmonary embolism      or other acute abnormality.  3. Thyroid gland ultrasound showed small scattered thyroid nodules      with nondominant nodule with normal thyroid size.  Follow up with      her primary doctor within a week and with her cardiologist within 1-      2 weeks.   COURSE OF HOSPITALIZATION:  1. Syncope.  This is most likely related to hypoglycemia and      hypotension.  Both of those problems were addressed during this      hospitalization.  The patient never had any recurrence of      hypoglycemia during this hospitalization.  Her blood pressure has      been monitored and was treated  with IV fluids.  The patient did not      have recurrence of her syncope.  She was followed by Cardiology      during this hospitalization.  Echocardiogram was done on July 23,      and it showed normal ejection fraction around 60-65%.  No further      recommendations from Cardiology regarding her syncopal episodes.  2. Atypical chest pain.  This patient had history of stent.  Her      coronaries were evaluated recently with cardiac cath on July 2010,      showed normal coronary arteries.  Her chest pain has been followed      by Cardiology during this hospitalization.  She was maintained on      her medications with no need of intervention at this point.  At the      time of  discharge, the patient is totally asymptomatic.  3. Subclinical hypothyroidism.  Her thyroid stimulating hormone was      elevated.  Initially, T3 and T4 were done and they came to be      normal.  Then, free T3 and free T4 were done and again they came to      be normal.  Ultrasound was done and it showed multiple small      nodules without dominant nodule.  The whole picture consistent with      subclinical hypothyroidism, which does not need treatment, but the      possibility of progression to full hypothyroidism is present.      Recommendation is to repeat her thyroid function test within 6-8      weeks.  That has been explained to the patient and she voiced      understanding.  4. Hypoglycemia that was secondary to poor oral intake where the      patient kept down her sugar intake seriously for weight loss.  That      has been treated with D50 on her presentation.  She never had any      recurrence during this hospitalization.  5. Hypotension.  The patient had mild orthostatic hypotension at the      time of admission.  Her beta-blockers were held, and the patient      was put in IV fluids.  Cortisol level was done and it came to be      normal.  Her medications were started slowly where her beta-       blockers were started and then, it was again held because of      hypotension.  She was recommended to take more salt in her diet to      get her blood pressure higher.  Again, this issue will be followed      by her cardiologist as an outpatient.   Otherwise, other medical conditions remained stable.  The patient will  be discharged today in stable condition.  She was evaluated by Cardiac  Rehab and she did fine.  She will be discharged the above-mentioned  medications and follow with her primary doctor and cardiologist.   TOTAL ASSESSMENT TIME:  40 minutes.      Michelene Gardener, MD  Electronically Signed     NAE/MEDQ  D:  11/05/2008  T:  11/06/2008  Job:  [id number]

## 2010-08-30 NOTE — Consult Note (Signed)
Briana Bowers, ZARAGOZA NO.:  [id number]   MEDICAL RECORD NO.:  [id number]                   PATIENT TYPE:  INP   LOCATION:  2550                                 FACILITY:  [facility]   PHYSICIAN:  Meade Maw, M.D.                 DATE OF BIRTH:  04/20/1950   DATE OF CONSULTATION:  DATE OF DISCHARGE:                                   CONSULTATION   REASON FOR CONSULTATION:  Chest pain.   HISTORY:  Briana Bowers is a 60 year old female known to Dr. Amil Amen. She has  a past medical history of coronary artery disease. She had a non Q wave  infarction in 1999 with subsequent right coronary artery angioplasty and  stent. She presented two months post that stent for recurrent chest pain.  She underwent left heart catheterization and was then demonstrated to have a  patent stent. She was again evaluated in March of 2001 for recurrent chest  pain described as left anterior chest discomfort like a swelling with  discomfort radiating to her left arm. She underwent a stress Cardiolite at  that time. This demonstrated normal myocardial perfusion. She has preserved  systolic function. Following her L4-L5 revision which required the prone  position for approximately four hours, she developed chest pain in the  recovery period again described as a left substernal pain feeling as if her  chest was swelling. There was radiation to her left arm. The total duration  of the pain was approximately 1 hour. She was given Dilaudid, sublingual  nitroglycerin and a GI cocktail with complete resolution of the chest pain.  She currently is pain free.   PAST MEDICAL HISTORY:  1. As noted above significant for coronary artery disease, non Q wave MI     April of 1999 with subsequent stent in the RCA.  2. June 1999, cardiac catheterization revealing patent coronary arteries,     normal LV  function.  3. Adenosine Cardiolite March 2001 revealing normal myocardial perfusion.  4.  Degenerative joint disease of the spine with chronic pain.  5. Chronic fatigue syndrome.  6. Dyslipidemia on Avecor and Zocor.   PAST SURGICAL HISTORY:  1. Significant for tonsillectomy.  2. Appendectomy.  3. Left and right tympanoplasty.  4. Tubal ligation.  5. Hysterectomy.  6. Deviated septum repair.  7. Right and left oophorectomy with resection of a small mass in her left     groin.  8. Ruptured disk repair.  9. Removal of scar tissue around the sciatic nerve.  10.      Removal of a right arm ganglion cyst.  11.      Cholecystectomy.  12.      __________ collateral repair and removal of a pin in 1997.  13.      Recent L4-5 revision.   ALLERGIES:  No known drug allergies.   CURRENT MEDICATIONS:  1. Azmacort  1 puff t.i.d.  2. Albuterol 2 puffs p.r.n.  3. Flonase 2 sprays each nostril daily.  4. Zyrtec 5 mg p.o. q.h.s.  5. Prevacid 30 mg daily.  6. Avecor 20/500 2 p.o. q.h.s.  7. Zocor 20 mg p.o. q.p.m.  8. Xanax 0.5 mg p.o. t.i.d.  9. Celexa 20 mg p.o. q.h.s.  10.      Singulair 10 mg p.o. q.h.s.  11.      Prilosec 5 mg p.o. q.a.m.  12.      Zebeta 10 mg p.o. q.a.m. and t.i.d. p.r.n.   SOCIAL HISTORY:  She lives in [place], she has one daughter and one son.  She stopped smoking in 1983 after a 15-20 year pack history. No history of  alcohol use. She is married, lives with her mother.   FAMILY HISTORY:  Mother questionably had myocardial infarction at age 36.  Father was deceased at 31 from myocardial infarction. She has two brothers  and two sisters. One sister had ischemic heart disease in her 65s.   REVIEW OF SYMPTOMS:  As noted per HPI.   PHYSICAL EXAMINATION:  GENERAL:  Reveals a middle aged female appearing  older than her stated age. She currently is pain free.  VITAL SIGNS:  Her systolic pressure currently is 107/40. Systolic pressure  did go down to 88 following Dilaudid. Heart rate is 75 to 87. Telemetry  revealed sinus rhythm with no ectopy. Her O2  sat is 99 to 100%.  HEENT:  Unremarkable. There is no neck vein distention noted.  PULMONARY:  Reveals breath sounds which are equal and clear to auscultation.  CARDIOVASCULAR:  Reveals a normal S1, normal S2, regular rate and rhythm, no  rubs, murmurs or gallops noted. PMI is within normal limits.  ABDOMEN:  Benign. There is hyperactive bowel sounds.  BACK:  Reveals the presence of a dressing in place with a drainage tube.  EXTREMITIES:  Reveals the presence of pneumatic stockings. The distal pulses  are 2+ and equal. There is no pedal edema noted.   LABORATORY DATA:  ECG reveals a sinus rhythm, no ischemic changes, unchanged  from prior EKG. Her initial hematocrit was 30, hematocrit has decreased to  20 post surgery. Platelet count is 362, INR is 1.0. Her potassium is 3.8,  creatinine is 0.8, glucose 86, normal liver enzymes.   IMPRESSION:  1. Chest pain. Differential diagnosis including angina versus GE reflux.     Would continue with your current therapy. Would give sublingual     nitroglycerin p.r.n. for chest pain, hold for a systolic blood pressure     less than 90. Obtain serial cardiac enzymes. Will not initiate     anticoagulation in view of her recent surgery. She should be started on     aspirin when deemed safe to do so from surgical standpoint. She will be     admitted to [facility] for further evaluation. She currently is pain free within     IV nitrate.  2. Anemia. Would transfuse 2 units in view of her known coronary artery     disease. May transfuse each unit over 4 hours.  3. Gastroesophageal reflux. Would discontinue her Prevacid and start     Protonix 40 mg b.i.d.  4. Dyslipidemia. Her recent liver enzymes are normal. Would continue with     Avecor and Zocor.  Meade Maw, M.D.    HP/MEDQ  D:  05/11/2002  T:  05/11/2002  Job:  [id number]   cc:   Sharolyn Douglas, M.D. [address]  [place]  [place] [address]  Fax:  [phone]   Francisca December, M.D.  [address]  [place]  [place] [address]  Fax: [phone]   Lesli Albee  [address]  [place]  [place] [address]  Fax: [phone]

## 2010-08-30 NOTE — Op Note (Signed)
NAMEVICY, MEDICO NO.:  [id number]   MEDICAL RECORD NO.:  [id number]                   PATIENT TYPE:  OIB   LOCATION:  2550                                 FACILITY:  [facility]   PHYSICIAN:  Leonie Man, M.D.                DATE OF BIRTH:  [birth year]   DATE OF PROCEDURE:  02/17/2002  DATE OF DISCHARGE:                                 OPERATIVE REPORT   PREOPERATIVE DIAGNOSIS:  Ventral hernia.   POSTOPERATIVE DIAGNOSIS:  Lax abdominal wall, no acute ventral hernia noted.  Large subcutaneous fat pad.   OPERATION PERFORMED:  Abdominal wall exploration and repair of abdominal  wall with polypropylene mesh.   SURGEON:  Leonie Man, M.D.   ASSISTANT:  Nurse.   ANESTHESIA:  General.   INDICATIONS FOR PROCEDURE:  The patient is a 60 year old lady who presents  with pain and an abdominal wall bulge.  She said it is extremely painful at  times.  She was brought to the operating room for abdominal wall exploration  and ventral hernia repair after the risks and potential benefits of surgery  had been fully discussed and consent obtained.   DESCRIPTION OF PROCEDURE:  Following the induction of satisfactory general  anesthesia with the patient positioned supinely, the abdomen was then  prepped and draped to be included in a sterile operative field.  A midline  incision was made over the old scar cicatrix of the anterior abdominal wall.  This was deepened through the cicatrix and subcutaneous tissues down to the  fascia.  The fascia was opened without ever actually noting a true hernia  sac.  The abdomen was entered and multiple adhesions of the anterior  abdominal wall taken down.  The region of concern turned out to be a large  lipomatous mass bulging into the abdominal wall.  However, the anterior  abdominal wall was rather lax without any evidence of a muscular diastasis.  This was repaired with a Marlex mesh strip that was sewn in over the  defect  after a Seprafilm patch was placed in to protect the viscera from the mesh.  The suture line was begun superiorly and carried down along the edge of the  wound with a #1 Novofil to completely encircle the defect.  Sponge,  instrument and sharp counts were then verified.  The subcutaneous tissues  were closed with running 2-0 Vicryl suture and the skin was closed with  running 4-0 Monocryl suture.  The wound was then reinforced with Steri-  Strips.  Sterile dressings applied.  Anesthetic was reversed and the patient  removed from the operating room to the recovery room in stable condition,  having tolerated the procedure well.  Leonie Man, M.D.    PB/MEDQ  D:  02/17/2002  T:  02/17/2002  Job:  [id number]

## 2010-08-30 NOTE — H&P (Signed)
Briana Bowers, Briana Bowers NO.:  [id number]   MEDICAL RECORD NO.:  [id number]                   PATIENT TYPE:  INP   LOCATION:  2550                                 FACILITY:  [facility]   PHYSICIAN:  Sharolyn Douglas, M.D.                     DATE OF BIRTH:  [birth year]   DATE OF ADMISSION:  05/11/2002  DATE OF DISCHARGE:                                HISTORY & PHYSICAL   CHIEF COMPLAINT:  Back and right hip pain.   HISTORY OF PRESENT ILLNESS:  The patient is a 60 year old female with back  and hip pain who was referred to Dr. Noel Gerold for a second opinion by Dr.  Nicanor Alcon. She has a long history of back pain dating back to the 48s and  early 44s. She is status post L5-S1 diskectomy in 6/84 and a revision  decompression in 1989. She did relatively well for many years until 1/03  when she redeveloped back pain and radiation into her right leg in a L5  distribution. She complains of numbness and burning. Describes the pain as a  8/10 on a visual analog scale. Keeps her up at night. She has failed  numerous methods of conservation management, interfering with activities of  daily living and quality of life. Risks and benefits of the proposed surgery  were discussed with the patient by Dr. Sharolyn Douglas as well as myself. She  indicated understanding and opted to proceed.   ALLERGIES:  Dilaudid causes hypotension.   MEDICATIONS:  1. Azmacort four puffs t.i.d.  2. Albuterol two puffs p.r.n.  3. Flonase two sprays each nostril daily.  4. Zyrtec 10 mg one half q.h.s.  5. Prilosec 10 mg one half q.a.m.  6. Vicodin p.r.n.  7. Prevacid 30 mg q.a.m.  8. Advicor 20/500 two daily.  9. Zocor 20 mg q.h.s.  10.      Xanax 0.5 mg t.i.d.  11.      Zebeta 10 mg q.a.m. and t.i.d. p.r.n.  12.      Flexeril p.r.n.  13.      Lodine 300 mg t.i.d.  14.      Estratest nitroglycerin spray p.r.n.  15.      Nortriptyline 50 mg q.h.s.  16.      Aspirin daily.  17.      Celexa 20 mg  p.o. daily.  18.      Singulair 10 mg p.o. daily.   PAST MEDICAL HISTORY:  1. Chronic asthma.  2. Sciatic nerve injury.  3. Degenerative disk disease.  4. Osteoarthritis.  5. Hypercholesterolemia.  6. Status post heart stent.  7. Chronic fatigue syndrome.   PAST SURGICAL HISTORY:  1. Tonsillectomy.  2. Appendectomy back in the 1960s.  3. Tubal ligation in 1973.  4. Hysterectomy in 1974.  5. Microdiskectomy in 1984.  6. Cholecystectomy in 1991.  7. Heart stent  in 1999.  8. Heart catheterization in 1999.  9. Hernia repair in 2003.   SOCIAL HISTORY:  The patient denies tobacco use, denies alcohol use. She is  married and has two children. Her mother will be available to help with her  postoperative course. She is good health.   FAMILY HISTORY:  Significant for osteoarthritis, diabetes, hypertension,  coronary artery disease, and breast cancer.   REVIEW OF SYMPTOMS:  The patient denies any fevers, chills, sweats, bleeding  tendencies. CNS:  Denies any blurred vision, double vision, seizures,  headaches, or paralysis. CARDIOVASCULAR:  Denies chest pain, angina,  orthopnea, claudication, or palpitations. PULMONARY:  Denies shortness of  breath, productive cough, or hemoptysis. GASTROINTESTINAL:  Denies nausea,  vomiting, constipation, diarrhea, melena, or blood in stools. GENITOURINARY:  Denies dysuria, hematuria, or discharge. MUSCULOSKELETAL:  As per HPI.   PHYSICAL EXAMINATION:  VITAL SIGNS:  Blood pressure 104/58, respirations 16  and unlabored, pulse 60 and regular.  GENERAL APPEARANCE:  The patient is a 60 year old white female who is alert  and oriented in no acute distress. Well nourished, well groomed, appears  stated age. Pleasant and cooperative to exam.  HEENT:  Head is normocephalic, atraumatic. Pupils are equal, round, and  reactive to light. Extraocular movements intact. Nares patent. Oropharynx  clear.  NECK:  Soft to palpation. No lymphadenopathy, thyromegaly,  or bruits  appreciated.  CHEST:  Clear to auscultation bilaterally. No rales, rhonchi, expiratory  wheezes, or friction rubs.  BREASTS:  Not pertinent, not performed.  HEART:  S1 and S2; regular rate and rhythm; no murmurs, rubs, or gallops  noted.  ABDOMEN:  Soft to palpation, positive bowel sounds, nontender, nondistended,  no organomegaly noted.  GENITOURINARY:  Not pertinent, not performed.  EXTREMITIES:  The patient has positive posterior tibialis pulses  bilaterally. Motor function and sensation are grossly intact today.  SKIN:  Intact without any lesions or rashes.   IMPRESSION:  1. Degenerative disk disease, spondylolisthesis, and spinal stenosis.  2. Anxiety.  3. Asthma.  4. Coronary artery disease.  5. Hypercholesterolemia.   PLAN:  1. Admit to [facility] on 05/11/02 for revision laminectomy, L4 to S1, and     posterior spinal fusion, L4 to S1.     This will be done by Dr. Sharolyn Douglas.  2. The patient's primary care physician is Dr. Nicanor Alcon in [facility],     cardiologist Dr. Corliss Marcus. She was cleared preoperatively for this     surgery.     Verlin Fester, P.A.                       Sharolyn Douglas, M.D.    CM/MEDQ  D:  05/11/2002  T:  05/11/2002  Job:  [id number]

## 2010-08-30 NOTE — H&P (Signed)
[place]  Patient:    Briana Bowers, Briana Bowers                        MRN: [id number] Adm. Date:  [id number] Attending:  Corliss Marcus Dictator:   Anselm Lis, N.P. CC:         Lesli Albee, M.D. in [facility]             Patients chart at [facility], Room 2038                         History and Physical  PRIMARY CARE PHYSICIAN:  Lesli Albee, M.D. in [place].  NOTE:  She is currently in 2038 at [facility].  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  Follow-up cardiac catheterization two months post that stent revealed patent stent site.  The patient is complaining of fatigue over the last six weeks. Over the last week, she developed intermittent left anterior chest discomfort "like swelling" with discomfort radiating to her left arm.  She also experienced episodic nausea with complaint of episodic dizziness, felt progressively short of breath. She did not take any nitrates at home.  In the emergency room, she was given a sublingual nitrate with transient relief of chest pain, then recurrence.  She is currently pain-free on IV nitrates.  The first set of cardiac enzymes were negative.  Her EKG is nonischemic.  PAST MEDICAL HISTORY: 1. Coronary atherosclerotic heart disease.    a. Non-Q wave MI, April 1999, with subsequent stent RCA.    b. June 1999 cardiac catheterization revealing patent coronary arteries; normal       LV function. 2. 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 3. Chronic fatigue syndrome. 4. Hypercholesterolemia on Niaspan and Lipitor, management by primary care.  PAST SURGICAL HISTORY:  Tonsillectomy, appendectomy, left and right tympanoplasty, tubal ligation, hysterectomy, deviated septum  repair, right and left oophorectomy, resection of small mass in left groin, ruptured disk repair, removal of scar tissue around sciatic nerve, removal of right arm ganglion cyst, cholecystectomy, radial collateral repair, and removal of a pin in 1997.  ALLERGIES:  No known drug allergies; okay with seafood and shellfish.  CURRENT MEDICATIONS:  1. Azmacort four puffs p.o. t.i.d.  2. Naldecon one p.o. t.i.d.  3. Maxair p.r.n.  4. Flonase two puffs each nare q.d.  5. Vicodin 5/500 one p.o. q.i.d.  6. Prevacid 30 mg p.o. q.d.  7. Lipitor 10 mg p.o. q.d.  8. Niaspan 1 g p.o. q.d.  9. Xanax 0.5 mg p.o. t.i.d. 10. Zebeta 7.5 mg p.o. q.d. 11. Flexeril 5 mg p.o. q.i.d. 12. Lodine 300 mg one p.o. b.i.d. 13. Estratest one p.o. b.i.d. 14. Provera (currently not taking this month) 0.5 mg day 16-30 of month. 15. Nitroglycerin p.r.n. 16. Amitriptyline 50 mg p.o. q.h.s. 17. Enteric-coated aspirin 325 mg once daily.  SOCIAL HISTORY AND HABITS:  She is married though husband is currently living in [place] secondary to his job is there.  She has one daughter and one son. Tobacco:  Quit in 1983 prior to 15-20 year use.  ETOH:  Negative.  Caffeine: Negative.  FAMILY HISTORY:  Mother possibly had a heart attack at age 62.  She is currently 10.  Father deceased age 12 of myocardial infarction, possibly had ischemic heart disease as early as age 72s.  Patient has two brothers and two sisters.  One of  these sisters had ischemic heart disease in her 3s, and the other has mitral valve prolapse.  Also of note, mother did have hyperthyroidism with subsequent resection and need for thyroid replacement hormone.  REVIEW OF SYSTEMS:  As HPI and past medical history.  Otherwise denies problems  with pedal edema, orthopnea, nor PND.  Has episodic dysphagia; negative constipation, diarrhea, nor bright red blood PR.  She does drink approximately wo gallons of fluid a day, and if she does not, will  feel like she is constipated.  Negative dysuria nor hematuria.  Does have arthritic-type complaints causing chronic pain with medications as listed above.  PHYSICAL EXAMINATION:  Blood pressure is 130/72 with heart rate 82, temperature  97.3, respiratory rate 20, and O2 saturation 100 percent on 2 L.  GENERAL:  She is a slender 60 year old female in no apparent distress. Pleasantly conversant.  HEENT:  Brisk bilateral carotid upstrokes without bruit heard.  NECK:  Negative JVD.  CHEST:  Lung sounds clear with equal bilateral excursion.  No murmurs, rubs, or  gallops.  CARDIOVASCULAR:  Regular rate and rhythm without murmurs, rubs, nor gallops.  ABDOMINAL:  Benign.  Negative abdominal aortic, renal, nor femoral bruit. Negative masses nor organomegaly.  Nontender to palpation.  EXTREMITIES:  +2/4 radial, femoral, and dorsalis pedis.  Negative pedal edema.  NEUROLOGIC:  Cranial nerves II-XII are grossly intact.  Alert and oriented times three.  GENITOURINARY/RECTAL:  Deferred.  DIAGNOSTIC STUDIES:  Hemoglobin was 16, WBC 6.2, and platelets 380.  Sodium was  136, potassium 3.8, chloride 98, BUN 7, creatinine 0.6, and glucose 81.  CK was  140, MB fraction 0.9, and troponin-I less than 0.03.  Chest x-ray is pending at the time of this dictation.  EKG revealed a NSR without ischemic changes.  IMPRESSION: 1. Atypical chest pain in this 60 year old who is status post stent in her right    coronary artery two years earlier.  First set of cardiac enzymes are negative.    Her EKG is nonischemic.  She is currently pain-free on IV nitrates. 2. Dyslipidemia.  Management by primary care on Niaspan and Lipitor.  LFTs within    normal range. 3. Chronic fatigue syndrome. 4. Chronic pain syndrome secondary to degenerative joint disease. 5. Asthma.  PLAN:  Adenosine Cardiolite in the morning.  Admit to telemetry.  Rule out MI protocol with serial cardiac enzymes and daily EKG. DD:   06/26/99 TD:  06/26/99 Job: [id number]

## 2011-07-10 ENCOUNTER — Ambulatory Visit: Payer: Medicare Other | Attending: Otolaryngology | Admitting: Audiology

## 2011-07-10 DIAGNOSIS — H9190 Unspecified hearing loss, unspecified ear: Secondary | ICD-10-CM | POA: Insufficient documentation

## 2012-03-17 ENCOUNTER — Other Ambulatory Visit: Payer: Self-pay | Admitting: Orthopaedic Surgery

## 2012-03-17 DIAGNOSIS — M549 Dorsalgia, unspecified: Secondary | ICD-10-CM

## 2012-03-19 ENCOUNTER — Ambulatory Visit
Admission: RE | Admit: 2012-03-19 | Discharge: 2012-03-19 | Disposition: A | Discharge status: Home / Self Care | Payer: Medicare Other | Source: Ambulatory Visit | Attending: Orthopaedic Surgery | Admitting: Orthopaedic Surgery

## 2012-03-19 DIAGNOSIS — M549 Dorsalgia, unspecified: Secondary | ICD-10-CM

## 2012-11-12 ENCOUNTER — Other Ambulatory Visit: Payer: Self-pay | Admitting: Physical Medicine and Rehabilitation

## 2012-12-27 ENCOUNTER — Emergency Department (HOSPITAL_COMMUNITY): Payer: Medicare Other

## 2012-12-27 ENCOUNTER — Encounter (HOSPITAL_COMMUNITY): Payer: Self-pay | Admitting: *Deleted

## 2012-12-27 ENCOUNTER — Inpatient Hospital Stay (HOSPITAL_COMMUNITY)
Admission: EM | Admit: 2012-12-27 | Discharge: 2012-12-29 | DRG: 247 | Disposition: A | Discharge status: Home / Self Care | Payer: Medicare Other | Attending: Interventional Cardiology | Admitting: Interventional Cardiology

## 2012-12-27 DIAGNOSIS — Z8679 Personal history of other diseases of the circulatory system: Secondary | ICD-10-CM

## 2012-12-27 DIAGNOSIS — Z8249 Family history of ischemic heart disease and other diseases of the circulatory system: Secondary | ICD-10-CM

## 2012-12-27 DIAGNOSIS — M1990 Unspecified osteoarthritis, unspecified site: Secondary | ICD-10-CM

## 2012-12-27 DIAGNOSIS — I200 Unstable angina: Secondary | ICD-10-CM | POA: Diagnosis present

## 2012-12-27 DIAGNOSIS — I2510 Atherosclerotic heart disease of native coronary artery without angina pectoris: Secondary | ICD-10-CM

## 2012-12-27 DIAGNOSIS — F411 Generalized anxiety disorder: Secondary | ICD-10-CM | POA: Diagnosis present

## 2012-12-27 DIAGNOSIS — R5382 Chronic fatigue, unspecified: Secondary | ICD-10-CM

## 2012-12-27 DIAGNOSIS — R7303 Prediabetes: Secondary | ICD-10-CM | POA: Diagnosis present

## 2012-12-27 DIAGNOSIS — Z9089 Acquired absence of other organs: Secondary | ICD-10-CM

## 2012-12-27 DIAGNOSIS — Z9861 Coronary angioplasty status: Secondary | ICD-10-CM

## 2012-12-27 DIAGNOSIS — R7309 Other abnormal glucose: Secondary | ICD-10-CM | POA: Diagnosis present

## 2012-12-27 DIAGNOSIS — F419 Anxiety disorder, unspecified: Secondary | ICD-10-CM

## 2012-12-27 DIAGNOSIS — K219 Gastro-esophageal reflux disease without esophagitis: Secondary | ICD-10-CM | POA: Diagnosis present

## 2012-12-27 DIAGNOSIS — I252 Old myocardial infarction: Secondary | ICD-10-CM

## 2012-12-27 DIAGNOSIS — I10 Essential (primary) hypertension: Secondary | ICD-10-CM | POA: Diagnosis present

## 2012-12-27 DIAGNOSIS — E785 Hyperlipidemia, unspecified: Secondary | ICD-10-CM

## 2012-12-27 DIAGNOSIS — Z87891 Personal history of nicotine dependence: Secondary | ICD-10-CM

## 2012-12-27 DIAGNOSIS — R079 Chest pain, unspecified: Secondary | ICD-10-CM

## 2012-12-27 HISTORY — DX: Generalized anxiety disorder: F41.1

## 2012-12-27 HISTORY — DX: Low back pain, unspecified: M54.50

## 2012-12-27 HISTORY — DX: Personal history of other diseases of the digestive system: Z87.19

## 2012-12-27 HISTORY — DX: Chronic fatigue, unspecified: R53.82

## 2012-12-27 HISTORY — DX: Low back pain: M54.5

## 2012-12-27 HISTORY — DX: Acute myocardial infarction, unspecified: I21.9

## 2012-12-27 HISTORY — DX: Personal history of other medical treatment: Z92.89

## 2012-12-27 HISTORY — DX: Anxiety disorder, unspecified: F41.9

## 2012-12-27 HISTORY — DX: Atherosclerotic heart disease of native coronary artery without angina pectoris: I25.10

## 2012-12-27 HISTORY — DX: Prediabetes: R73.03

## 2012-12-27 HISTORY — DX: Other chronic pain: G89.29

## 2012-12-27 HISTORY — DX: Gastro-esophageal reflux disease without esophagitis: K21.9

## 2012-12-27 HISTORY — DX: Unspecified osteoarthritis, unspecified site: M19.90

## 2012-12-27 LAB — POCT I-STAT TROPONIN I
Troponin i, poc: 0 ng/mL (ref 0.00–0.08)
Troponin i, poc: 0.01 ng/mL (ref 0.00–0.08)

## 2012-12-27 LAB — CBC
MCV: 86.6 fL (ref 78.0–100.0)
Platelets: 255 10*3/uL (ref 150–400)
RDW: 14.4 % (ref 11.5–15.5)
WBC: 5.3 10*3/uL (ref 4.0–10.5)

## 2012-12-27 LAB — COMPREHENSIVE METABOLIC PANEL
Albumin: 4 g/dL (ref 3.5–5.2)
BUN: 14 mg/dL (ref 6–23)
Chloride: 103 mEq/L (ref 96–112)
Creatinine, Ser: 0.67 mg/dL (ref 0.50–1.10)
Total Bilirubin: 0.5 mg/dL (ref 0.3–1.2)

## 2012-12-27 LAB — BASIC METABOLIC PANEL
Calcium: 9.8 mg/dL (ref 8.4–10.5)
Chloride: 99 mEq/L (ref 96–112)
Creatinine, Ser: 0.65 mg/dL (ref 0.50–1.10)
GFR calc Af Amer: >90 mL/min (ref >90)

## 2012-12-27 LAB — PRO B NATRIURETIC PEPTIDE: Pro B Natriuretic peptide (BNP): 15.8 pg/mL (ref 0–125)

## 2012-12-27 LAB — PROTIME-INR: INR: 0.95 (ref 0.00–1.49)

## 2012-12-27 MED ORDER — ENOXAPARIN SODIUM 80 MG/0.8ML ~~LOC~~ SOLN
65.0000 mg | Freq: Two times a day (BID) | SUBCUTANEOUS | Status: DC
  Administered 2012-12-27 – 2012-12-28 (×2): 65 mg via SUBCUTANEOUS
  Filled 2012-12-27 (×6): qty 0.8

## 2012-12-27 MED ORDER — SODIUM CHLORIDE 0.9 % IV SOLN
1000.0000 mL | INTRAVENOUS | Status: DC
  Administered 2012-12-27: 1000 mL via INTRAVENOUS

## 2012-12-27 MED ORDER — FAMOTIDINE 20 MG PO TABS
20.0000 mg | ORAL_TABLET | Freq: Every day | ORAL | Status: DC
  Administered 2012-12-27 – 2012-12-29 (×4): 20 mg via ORAL
  Filled 2012-12-27 (×4): qty 1

## 2012-12-27 MED ORDER — NITROGLYCERIN 2 % TD OINT
1.0000 [in_us] | TOPICAL_OINTMENT | Freq: Once | TRANSDERMAL | Status: AC
  Administered 2012-12-27: 1 [in_us] via TOPICAL
  Filled 2012-12-27: qty 1

## 2012-12-27 MED ORDER — SODIUM CHLORIDE 0.9 % IJ SOLN
3.0000 mL | Freq: Two times a day (BID) | INTRAMUSCULAR | Status: DC
  Administered 2012-12-27 – 2012-12-28 (×2): 3 mL via INTRAVENOUS

## 2012-12-27 MED ORDER — ASPIRIN EC 325 MG PO TBEC: 325.0000 mg | DELAYED_RELEASE_TABLET | Freq: Every day | ORAL | Status: DC

## 2012-12-27 MED ORDER — ROSUVASTATIN CALCIUM 20 MG PO TABS
20.0000 mg | ORAL_TABLET | Freq: Every day | ORAL | Status: DC
  Administered 2012-12-28: 22:00:00 20 mg via ORAL
  Filled 2012-12-27 (×2): qty 1

## 2012-12-27 MED ORDER — EST ESTROGENS-METHYLTEST 1.25-2.5 MG PO TABS
1.0000 | ORAL_TABLET | Freq: Every day | ORAL | Status: DC
  Administered 2012-12-27 – 2012-12-28 (×2): 1 via ORAL
  Filled 2012-12-27 (×2): qty 1

## 2012-12-27 MED ORDER — NITROGLYCERIN 0.4 MG SL SUBL: 0.4000 mg | SUBLINGUAL_TABLET | SUBLINGUAL | Status: DC | PRN

## 2012-12-27 MED ORDER — ONDANSETRON HCL 4 MG/2ML IJ SOLN: 4.0000 mg | Freq: Four times a day (QID) | INTRAMUSCULAR | Status: DC | PRN

## 2012-12-27 MED ORDER — MELOXICAM 15 MG PO TABS
15.0000 mg | ORAL_TABLET | Freq: Every day | ORAL | Status: DC | PRN
  Filled 2012-12-27: qty 1

## 2012-12-27 MED ORDER — MORPHINE SULFATE 4 MG/ML IJ SOLN
4.0000 mg | Freq: Once | INTRAMUSCULAR | Status: AC
  Administered 2012-12-27: 4 mg via INTRAVENOUS
  Filled 2012-12-27: qty 1

## 2012-12-27 MED ORDER — ACETAMINOPHEN 325 MG PO TABS: 650.0000 mg | ORAL_TABLET | ORAL | Status: DC | PRN

## 2012-12-27 MED ORDER — NITROGLYCERIN 2 % TD OINT
1.0000 [in_us] | TOPICAL_OINTMENT | Freq: Four times a day (QID) | TRANSDERMAL | Status: DC
  Administered 2012-12-27 – 2012-12-29 (×4): 1 [in_us] via TOPICAL
  Filled 2012-12-27 (×2): qty 30

## 2012-12-27 MED ORDER — EST ESTROGENS-METHYLTEST 1.25-2.5 MG PO TABS: 1.0000 | ORAL_TABLET | Freq: Every day | ORAL | Status: DC

## 2012-12-27 MED ORDER — ATORVASTATIN CALCIUM 40 MG PO TABS: 40.0000 mg | ORAL_TABLET | Freq: Every day | ORAL | Status: DC

## 2012-12-27 MED ORDER — SODIUM CHLORIDE 0.9 % IJ SOLN: 3.0000 mL | INTRAMUSCULAR | Status: DC | PRN

## 2012-12-27 MED ORDER — SODIUM CHLORIDE 0.9 % IV SOLN: 250.0000 mL | INTRAVENOUS | Status: DC | PRN

## 2012-12-27 MED ORDER — FENTANYL CITRATE 0.05 MG/ML IJ SOLN
50.0000 ug | INTRAMUSCULAR | Status: DC | PRN
  Administered 2012-12-27: 50 ug via INTRAVENOUS
  Filled 2012-12-27: qty 2

## 2012-12-27 NOTE — ED Notes (Signed)
Pt is here with left under arm pain and pain shoots down left arm.  Pt reports belching like when she had mi in '99.  Pt reports left under breast pain that started today

## 2012-12-27 NOTE — ED Notes (Signed)
Cardiology at bedside.

## 2012-12-27 NOTE — H&P (Signed)
Briana Bowers is a 62 y.o. female  Admit date: 12/27/2012 Referring Physician: Iantha Fallen, M.D. Primary Cardiologist:: Catalina Gravel, MD Chief complaint / reason for admission: Left arm pain  HPI: 62 year old with prior history of non-ST elevation MI and right coronary stenting in 1999. She presents today with medial upper arm discomfort with some intermittent chest discomfort. She noticed the discomfort after she awakened from sleep. No dyspnea, nausea, diaphoresis, exertional component, positional components, or other complaints. She does stay the discomfort is similar to her prior pain 15 years ago. She is currently pain-free. Episodes of discomfort lasting minutes and then resolved.  She had a coronary angioplasty in 2010 that did not reveal significant obstructive disease. The right coronary stent was widely patent. Those calcified proximal less than 40% LAD disease. A myocardial perfusion study was performed in 2012 that was negative for ischemia. Prior recent evaluations were done due to chest pain not this complaint of left arm pain.    PMH:    Past Medical History  Diagnosis Date  . Myocardial infarction   . Arthritis     Degenerative joint disease  . CAD (coronary artery disease), native coronary artery 12/27/2012    RCA stent in the setting of non-ST elevation MI in 1999   . Gastroesophageal reflux disease 12/27/2012  . Anxiety disorder 12/27/2012  . Chronic fatigue 12/27/2012    PSH:    Past Surgical History  Procedure Laterality Date  . Stent removal    . Coronary stent placement    . Abdominal hysterectomy    . Appendectomy    . Tonsillectomy    . Cholecystectomy    . Back surgery    . Wrist surgery    . Hand surgery      ALLERGIES:   Atorvastatin; Ciprofloxacin; Levaquin; and Dilaudid  Prior to Admit Meds:   (Not in a hospital admission) Family HX:    Family History  Problem Relation Age of Onset  . Coronary artery disease Mother   . Coronary artery  disease Father    Social HX:    History   Social History  . Marital Status: Married    Spouse Name: N/A    Number of Children: N/A  . Years of Education: N/A   Occupational History  . Not on file.   Social History Main Topics  . Smoking status: Former Smoker    Types: Cigarettes    Quit date: 12/27/1981  . Smokeless tobacco: Not on file  . Alcohol Use: No  . Drug Use: No  . Sexual Activity: Not on file   Other Topics Concern  . Not on file   Social History Narrative  . No narrative on file     ROS: Musculoskeletal pain. Denies headache. Fatigue. Disabled due to chronic pain syndrome. Appetite normal. Recent injury of the left arm/tennis elbow. No transient neurological symptoms.  Physical Exam: Blood pressure 145/66, pulse 85, temperature 98.2 F (36.8 C), temperature source Oral, resp. rate 21, height 5\' 2"  (1.575 m), weight 65.318 kg (144 lb), SpO2 98.00%.    62 year old female appearing older than her stated age.  Skin clear without rash.  Bilateral faint carotid bruits are heard. No JVD is noted.  HEENT exam unremarkable.  Chest clear auscultation and percussion.  Cardiac exam is normal. No murmur, rub, click, or gallop is heard.  Abdomen is soft. Bowel sounds are normal. No bruits are heard.  Lower extremities reveal no edema. Femoral pulses and radial pulses  2+. Both arms appear normal. There is no palpable tenderness.  Neurological exam is grossly normal. Labs:   Lab Results  Component Value Date   WBC 5.3 12/27/2012   HGB 14.9 12/27/2012   HCT 42.7 12/27/2012   MCV 86.6 12/27/2012   PLT 255 12/27/2012     Recent Labs Lab 12/27/12 1203  NA 136  K 4.1  CL 99  CO2 28  BUN 13  CREATININE 0.65  CALCIUM 9.8  GLUCOSE 112*   Lab Results  Component Value Date   CKTOTAL 74 10/31/2008   CKMB 1.3 10/31/2008   TROPONINI  Value: 0.01        NO INDICATION OF MYOCARDIAL INJURY. 10/31/2008      Radiology:   CHEST - 2 VIEW  Comparison: 03/30/2012    Findings: Relatively low lung volumes with some crowding of  bibasilar bronchovascular structures. No confluent airspace  consolidation or overt edema. No effusion. Vascular clips in the  right upper abdomen. Heart size normal. Regional bones  unremarkable.  IMPRESSION:  Low volumes. No acute disease.  Original Report Authenticated By: D. Andria Rhein, MD    EKG:  Incomplete right bundle with poor R-wave progression. No acute change when compared to prior office tracings.  ASSESSMENT:   1. Left arm discomfort with no objective evidence of ischemia but subjective advocacy that symptoms are similar to previous ischemic event.  2. CAD with prior right coronary stent, 1999. More recent negative cardiac workups. 2010 Revealed the stent was widely patent.  3. Anxiety disorder  4. Osteoarthritis.  Plan:  1. Observation  2. Ischemic evaluation with either cath, stress test, or observation.  3. Serial markers and EKGs to look for evidence of ischemia Lesleigh Noe 12/27/2012 3:10 PM

## 2012-12-27 NOTE — Progress Notes (Signed)
ANTICOAGULATION CONSULT NOTE - Initial Consult  Pharmacy Consult for Lovenox Indication: chest pain/ACS  Allergies  Allergen Reactions  . Atorvastatin     Muscle soreness  . Ciprofloxacin Other (See Comments)    "makes me feel very weak"  . Levaquin [Levofloxacin In D5w] Other (See Comments)    "makes me feel very weak"  . Dilaudid [Hydromorphone Hcl]     Patient Measurements: Height: 5\' 2"  (157.5 cm) Weight: 144 lb (65.318 kg) IBW/kg (Calculated) : 50.1  Vital Signs: Temp: 98.4 F (36.9 C) (09/15 1747) Temp src: Oral (09/15 1747) BP: 109/52 mmHg (09/15 1747) Pulse Rate: 63 (09/15 1747)  Labs:  Recent Labs  12/27/12 1203 12/27/12 1322  HGB 14.9  --   HCT 42.7  --   PLT 255  --   APTT  --  30  LABPROT  --  12.5  INR  --  0.95  CREATININE 0.65  --     Estimated Creatinine Clearance: 63 ml/min (by C-G formula based on Cr of 0.65).   Medical History: Past Medical History  Diagnosis Date  . Myocardial infarction   . Arthritis     Degenerative joint disease  . CAD (coronary artery disease), native coronary artery 12/27/2012    RCA stent in the setting of non-ST elevation MI in 1999   . Gastroesophageal reflux disease 12/27/2012  . Anxiety disorder 12/27/2012  . Chronic fatigue 12/27/2012   Assessment:   62 year old woman with hx CAD; MI and stent in 1999, with chest pain today, admitted for observation. May need cath or stress test. Troponin 0.01. To begin Lovenox.  Goal of Therapy:  Anti-Xa level 0.6-1.2 units/ml 4hrs after LMWH dose given Monitor platelets by anticoagulation protocol: Yes   Plan:   Lovenox 65 mg SQ q12hrs.  Will follow up plans.  CBC by 9/18 if Lovenox to continue.  Dennie Fetters, [place] Pager: [phone] 12/27/2012,6:19 PM

## 2012-12-27 NOTE — ED Notes (Signed)
Pt c/o left sided intermittent cp that radiates under her arm. Pt rates pain 7/10. Pt states she has hx of MI.

## 2012-12-27 NOTE — ED Provider Notes (Signed)
CSN: [id number]     Arrival date & time 12/27/12  1149 History  First MD Initiated Contact with Patient 12/27/12 1219     Chief Complaint  Patient presents with  . Chest Pain   HPI Pt started having pain shooting down her left arm.  It has been ongoing for days she thinks but got more severe today.  She became concerned because she had similar symptoms with her MI years ago.  She had cardiac stents in the right CA in 1999.  Nothing seems to make it worse or better.  No specific movements.    No SOB.  She has had some belching with this.   Pt thinks that this morning at 0930 when the pain really started.  She does not think the symptoms over the weekend were similar to this morning's symptoms.  Pt is concerned that these symptoms are related to her heart.  IT took more than one blood tests to diagnose her MI in the past.  Pain is 10/10 right now.  Left side of chest and arm.    Past Medical History  Diagnosis Date  . Coronary artery disease   . Myocardial infarction    Past Surgical History  Procedure Laterality Date  . Stent removal    . Coronary stent placement    . Abdominal hysterectomy    . Appendectomy    . Tonsillectomy    . Cholecystectomy    . Back surgery    . Wrist surgery    . Hand surgery     No family history on file. History  Substance Use Topics  . Smoking status: Former Games developer  . Smokeless tobacco: Not on file  . Alcohol Use: No   OB History   Grav Para Term Preterm Abortions TAB SAB Ect Mult Living                 Review of Systems  All other systems reviewed and are negative.    Allergies  Ciprofloxacin and Levaquin  Home Medications   Current Outpatient Rx  Name  Route  Sig  Dispense  Refill  . aspirin EC 325 MG tablet   Oral   Take 325 mg by mouth at bedtime.         . B Complex-C (B-COMPLEX WITH VITAMIN C) tablet   Oral   Take 1 tablet by mouth daily.         Marland Kitchen estrogen-methylTESTOSTERone (ESTRATEST) 1.25-2.5 MG per tablet    Oral   Take 1 tablet by mouth at bedtime.         . meloxicam (MOBIC) 15 MG tablet   Oral   Take 15 mg by mouth daily as needed for pain.         . ranitidine (ZANTAC) 150 MG tablet   Oral   Take 150 mg by mouth 2 (two) times daily.         . rosuvastatin (CRESTOR) 20 MG tablet   Oral   Take 20 mg by mouth at bedtime.         . TURMERIC PO   Oral   Take 1 tablet by mouth 2 (two) times daily. 800 mg          BP 145/66  Pulse 85  Temp(Src) 98.2 F (36.8 C) (Oral)  Resp 21  Ht 5\' 2"  (1.575 m)  Wt 144 lb (65.318 kg)  BMI 26.33 kg/m2  SpO2 98% Physical Exam  Nursing note and vitals reviewed. Constitutional:  She appears well-developed and well-nourished. No distress.  HENT:  Head: Normocephalic and atraumatic.  Right Ear: External ear normal.  Left Ear: External ear normal.  Eyes: Conjunctivae are normal. Right eye exhibits no discharge. Left eye exhibits no discharge. No scleral icterus.  Neck: Neck supple. No tracheal deviation present.  Cardiovascular: Normal rate, regular rhythm and intact distal pulses.   Pulmonary/Chest: Effort normal and breath sounds normal. No stridor. No respiratory distress. She has no wheezes. She has no rales.  Abdominal: Soft. Bowel sounds are normal. She exhibits no distension. There is no tenderness. There is no rebound and no guarding.  Musculoskeletal: She exhibits no edema and no tenderness.  Full range of motion specifically of the left arm without any pain or discomfort  Neurological: She is alert. She has normal strength. No sensory deficit. Cranial nerve deficit:  no gross defecits noted. She exhibits normal muscle tone. She displays no seizure activity. Coordination normal.  Skin: Skin is warm and dry. No rash noted.  Psychiatric: She has a normal mood and affect.    ED Course  Procedures (including critical care time)  EKG Rate 85 Normal sinus rhythm Possible Left atrial enlargement Incomplete right bundle branch  block Cannot rule out Anterior infarct , age undetermined Abnormal ECG Labs Review Labs Reviewed  BASIC METABOLIC PANEL - Abnormal; Notable for the following:    Glucose, Bld 112 (*)    All other components within normal limits  CBC  PROTIME-INR  APTT  POCT I-STAT TROPONIN I   Imaging Review Dg Chest 2 View  12/27/2012   *RADIOLOGY REPORT*  Clinical Data: Chest pain.  CHEST - 2 VIEW  Comparison: 03/30/2012  Findings: Relatively low lung volumes with some crowding of bibasilar bronchovascular structures.  No confluent airspace consolidation or overt edema.  No effusion.  Vascular clips in the right upper abdomen.  Heart size normal.  Regional bones unremarkable.  IMPRESSION:  Low volumes.  No acute disease.   Original Report Authenticated By: D. Andria Rhein, MD   Medications  0.9 %  sodium chloride infusion (1,000 mLs Intravenous New Bag/Given 12/27/12 1347)  nitroGLYCERIN (NITROSTAT) SL tablet 0.4 mg (not administered)  nitroGLYCERIN (NITROGLYN) 2 % ointment 1 inch (1 inch Topical Given 12/27/12 1348)  morphine 4 MG/ML injection 4 mg (4 mg Intravenous Given 12/27/12 1348)   1400 Discussed with Dr Katrinka Blazing, [facility] cardiology.  Will evaluate the patient in the ED.  MDM   1. Chest pain   2. History of coronary artery disease     Patient's symptoms are concerning in that area very similar to her prior myocardial infarction. Initial troponin is normal and EKG is without acute ischemic changes.  I will consult with her cardiologist regarding further treatment. Patient may need additional risk stratification    Celene Kras, MD 12/27/12 1436

## 2012-12-28 ENCOUNTER — Encounter (HOSPITAL_COMMUNITY)
Admission: EM | Disposition: A | Discharge status: Home / Self Care | Payer: Medicare Other | Source: Home / Self Care | Attending: Interventional Cardiology

## 2012-12-28 DIAGNOSIS — I200 Unstable angina: Secondary | ICD-10-CM | POA: Diagnosis present

## 2012-12-28 HISTORY — PX: PERCUTANEOUS CORONARY STENT INTERVENTION (PCI-S): SHX5485

## 2012-12-28 HISTORY — PX: FRACTIONAL FLOW RESERVE WIRE: SHX5839

## 2012-12-28 HISTORY — PX: LEFT HEART CATHETERIZATION WITH CORONARY ANGIOGRAM: SHX5451

## 2012-12-28 LAB — POCT ACTIVATED CLOTTING TIME
Activated Clotting Time: 206 seconds
Activated Clotting Time: 314 seconds

## 2012-12-28 LAB — TROPONIN I
Troponin I: 0.54 ng/mL (ref <0.30)
Troponin I: <0.3 ng/mL (ref <0.30)

## 2012-12-28 LAB — HEMOGLOBIN A1C: Hgb A1c MFr Bld: 6.1 % — ABNORMAL HIGH (ref <5.7)

## 2012-12-28 LAB — TSH: TSH: 3.034 u[IU]/mL (ref 0.350–4.500)

## 2012-12-28 SURGERY — LEFT HEART CATHETERIZATION WITH CORONARY ANGIOGRAM
Anesthesia: LOCAL

## 2012-12-28 MED ORDER — SODIUM CHLORIDE 0.9 % IJ SOLN: 3.0000 mL | Freq: Two times a day (BID) | INTRAMUSCULAR | Status: DC

## 2012-12-28 MED ORDER — FENTANYL CITRATE 0.05 MG/ML IJ SOLN
INTRAMUSCULAR | Status: AC
  Filled 2012-12-28: qty 2

## 2012-12-28 MED ORDER — HEPARIN (PORCINE) IN NACL 2-0.9 UNIT/ML-% IJ SOLN
INTRAMUSCULAR | Status: AC
  Filled 2012-12-28: qty 1000

## 2012-12-28 MED ORDER — ASPIRIN 81 MG PO CHEW
81.0000 mg | CHEWABLE_TABLET | Freq: Every day | ORAL | Status: DC
  Administered 2012-12-29: 81 mg via ORAL
  Filled 2012-12-28: qty 1

## 2012-12-28 MED ORDER — SODIUM CHLORIDE 0.9 % IV SOLN: 1.0000 mL/kg/h | INTRAVENOUS | Status: DC

## 2012-12-28 MED ORDER — SODIUM CHLORIDE 0.9 % IV SOLN: 250.0000 mL | INTRAVENOUS | Status: DC | PRN

## 2012-12-28 MED ORDER — HEART ATTACK BOUNCING BOOK
Freq: Once | Status: AC
  Administered 2012-12-28: 08:00:00
  Filled 2012-12-28: qty 1

## 2012-12-28 MED ORDER — HEPARIN SODIUM (PORCINE) 1000 UNIT/ML IJ SOLN
INTRAMUSCULAR | Status: AC
  Filled 2012-12-28: qty 1

## 2012-12-28 MED ORDER — ADENOSINE 12 MG/4ML IV SOLN
12.0000 mL | INTRAVENOUS | Status: DC
  Administered 2012-12-28: 36 mg via INTRAVENOUS
  Filled 2012-12-28: qty 12

## 2012-12-28 MED ORDER — ACETAMINOPHEN 325 MG PO TABS: 650.0000 mg | ORAL_TABLET | ORAL | Status: DC | PRN

## 2012-12-28 MED ORDER — NITROGLYCERIN 0.2 MG/ML ON CALL CATH LAB
INTRAVENOUS | Status: AC
  Filled 2012-12-28: qty 1

## 2012-12-28 MED ORDER — SODIUM CHLORIDE 0.9 % IV SOLN
INTRAVENOUS | Status: DC
  Administered 2012-12-28: 11:00:00 via INTRAVENOUS

## 2012-12-28 MED ORDER — TICAGRELOR 90 MG PO TABS
90.0000 mg | ORAL_TABLET | Freq: Two times a day (BID) | ORAL | Status: DC
  Administered 2012-12-28 – 2012-12-29 (×2): 90 mg via ORAL
  Filled 2012-12-28 (×4): qty 1

## 2012-12-28 MED ORDER — DIAZEPAM 5 MG PO TABS: 5.0000 mg | ORAL_TABLET | ORAL | Status: AC

## 2012-12-28 MED ORDER — LIDOCAINE HCL (PF) 1 % IJ SOLN
INTRAMUSCULAR | Status: AC
  Filled 2012-12-28: qty 30

## 2012-12-28 MED ORDER — SODIUM CHLORIDE 0.9 % IV SOLN
INTRAVENOUS | Status: DC
  Administered 2012-12-28: 19:00:00 via INTRAVENOUS

## 2012-12-28 MED ORDER — MIDAZOLAM HCL 2 MG/2ML IJ SOLN
INTRAMUSCULAR | Status: AC
  Filled 2012-12-28: qty 2

## 2012-12-28 MED ORDER — TICAGRELOR 90 MG PO TABS
ORAL_TABLET | ORAL | Status: AC
  Filled 2012-12-28: qty 2

## 2012-12-28 MED ORDER — SODIUM CHLORIDE 0.9 % IJ SOLN: 3.0000 mL | INTRAMUSCULAR | Status: DC | PRN

## 2012-12-28 MED ORDER — ASPIRIN 81 MG PO CHEW: 324.0000 mg | CHEWABLE_TABLET | ORAL | Status: DC

## 2012-12-28 MED ORDER — SODIUM CHLORIDE 0.9 % IV SOLN: INTRAVENOUS | Status: AC

## 2012-12-28 MED ORDER — VERAPAMIL HCL 2.5 MG/ML IV SOLN
INTRAVENOUS | Status: AC
  Filled 2012-12-28: qty 2

## 2012-12-28 MED ORDER — DIAZEPAM 5 MG PO TABS
5.0000 mg | ORAL_TABLET | ORAL | Status: AC
  Administered 2012-12-28: 5 mg via ORAL
  Filled 2012-12-28: qty 1

## 2012-12-28 NOTE — Progress Notes (Signed)
Utilization Review Completed [place] J. Kamilo Och, RN, BSN, NCM [phone]  

## 2012-12-28 NOTE — CV Procedure (Addendum)
     Diagnostic Cardiac Catheterization and Percutaneous Coronary Intervention  Report  Briana Bowers  62 y.o.  female May 24, 1950  Procedure Date: 12/28/2012  Referring Physician: Catalina Gravel, MD Primary Cardiologist:: Same   PROCEDURE:  Left heart catheterization with selective coronary angiography, left ventriculogram.  INDICATIONS:  Unstable angina with left arm discomfort. Prior non-ST elevation MI 1999 with right coronary stent. The patient refused stress testing this admission after hearing about the one elevated troponin I.  The risks, benefits, and details of the procedure were explained to the patient.  The patient verbalized understanding and wanted to proceed.  Informed written consent was obtained.  PROCEDURE TECHNIQUE:  After Xylocaine anesthesia a 5 French Slender sheath was placed in the right radial artery with a single anterior needle wall stick.   Coronary angiography was done using a 5 [place] 3.5 cm JL and 5 [place] JR 4 diagnostic catheter.  Left ventriculography was done using hand injection via the JR 4 diagnostic catheter. The patient received heparin 3500 units IV as well as intra arteria/lsheath verapamil prior to angiography. No complications occurred.  After studying the patient's images, we did notice a hazy intermediate stenosis in the proximal to mid LAD. We placed a 3 cm XB LAD guide catheter and performed FFR recordings across the lesion in the LAD. The result was an FFR of 0.78.  Additional heparin was administered, making a total of 7500 units for the case. An ACT was documented to be greater than 300.Brilinta, 180 mg, was loaded orally. We then used a [place] guidewire to cross the stenosis. Predilatation was performed with a 15 mm x 2.5 mm diameter; semi-compliant balloon. We then positioned and deployed a 24 mm x 2.75 mm Promus Premier drug-eluting stent to 12 atmospheres. Postdilatation was performed sequentially with a 3.0 followed by a 3.25 x 15 mm long  balloon [place] balloon. Peak pressures were 15 atmospheres. The final result was acceptable. TIMI grade 3 flow was noted. Balloon inflations reproduced left arm discomfort.   CONTRAST:  Total of 210 cc.  COMPLICATIONS:  None.    HEMODYNAMICS:  Aortic pressure was 103/59 mm; LV pressure was 107/10; LVEDP 13 mm mercury.  There was no gradient between the left ventricle and aorta.    ANGIOGRAPHIC DATA:   The left main coronary artery is widely patent.  The left anterior descending artery is moderately calcified. In the proximal to mid LAD there is a somewhat hazy eccentric 50-70% stenosis depending upon review. The LAD is transapical..  The left circumflex artery is free of disease. It trifurcates into 3 obtuse marginal branches..  The right coronary artery is widely patent including the proximal stent. Within the stent there is mild SR less than 30%.  LEFT VENTRICULOGRAM:  Left ventricular angiogram was done in the 30 RAO projection and revealed normal left ventricular wall motion and systolic function with an estimated ejection fraction of 65 %.  PCI RESULTS: FFR across the LAD stenosis during adenosine infusion was 0.78.  Following stent implantation the stenosis in the LAD was reduced from 70% to 0% with TIMI grade 3 flow.   IMPRESSIONS:  1. Unstable angina with documentation of hemodynamically significant LAD stenosis. Successful DES implantation in the proximal LAD with reduction in 70% stenosis to 0%.  2. Widely patent right coronary stents and artery as well as widely patent circumflex.  3. Normal left ventricular function    RECOMMENDATION:  Discharge in a.m.  Aspirin and Brilinta.Marland Kitchen

## 2012-12-28 NOTE — Interval H&P Note (Signed)
History and Physical Interval Note: The patient is 17 and has history of non-ST elevation MI in 1999 treated with stenting by Dr. Amil Amen. At that time her symptoms were predominantly left arm discomfort. She presented yesterday with intermittent episodes of left arm discomfort, normal EKG, and suboptimal response to nitroglycerin. 1 troponin marker is mildly elevated at with 2 others are normal. She was scheduled for a nuclear stress test this morning but refused. After review of the clinical data we have decided to perform coronary angiography to define anatomy and help guide therapy. She continues to have intermittent episodes of arm discomfort. Her clinical diagnosis at this time his ACS.Cath Lab Visit (complete for each Cath Lab visit)  Clinical Evaluation Leading to the Procedure:   ACS: yes  Non-ACS:    Anginal Classification: CCS IV  Anti-ischemic medical therapy: Minimal Therapy (1 class of medications)  Non-Invasive Test Results: No non-invasive testing performed  Prior CABG: No previous CABG        12/28/2012 11:22 AM  Briana Bowers  has presented today for surgery, with the diagnosis of cp  The various methods of treatment have been discussed with the patient and family. After consideration of risks, benefits and other options for treatment, the patient has consented to  Procedure(s): LEFT HEART CATHETERIZATION WITH CORONARY ANGIOGRAM (N/A) as a surgical intervention .  The patient's history has been reviewed, patient examined, no change in status, stable for surgery.  I have reviewed the patient's chart and labs.  Questions were answered to the patient's satisfaction.     Lesleigh Noe

## 2012-12-28 NOTE — Progress Notes (Signed)
CRITICAL VALUE ALERT  Critical value received:  Troponin 0.54  Date of notification:  12/28/2012  Time of notification:  0205  Critical value read back:yes  Nurse who received alert:  Trula Ore RN  MD notified (1st page):  Dr Shirlee Latch  Time of first page:  0208  MD notified (2nd page):  Time of second page:  Responding MD:  Dr. Shirlee Latch  Time MD responded:  [phone]

## 2012-12-29 LAB — BASIC METABOLIC PANEL
CO2: 26 mEq/L (ref 19–32)
Calcium: 8.6 mg/dL (ref 8.4–10.5)
Creatinine, Ser: 0.68 mg/dL (ref 0.50–1.10)
GFR calc non Af Amer: >90 mL/min (ref >90)
Glucose, Bld: 117 mg/dL — ABNORMAL HIGH (ref 70–99)

## 2012-12-29 LAB — CBC
MCH: 29.8 pg (ref 26.0–34.0)
MCHC: 34.2 g/dL (ref 30.0–36.0)
MCV: 87.4 fL (ref 78.0–100.0)
Platelets: 208 10*3/uL (ref 150–400)
RBC: 4.19 MIL/uL (ref 3.87–5.11)

## 2012-12-29 MED ORDER — TICAGRELOR 90 MG PO TABS: 90.0000 mg | ORAL_TABLET | Freq: Two times a day (BID) | ORAL | Status: DC

## 2012-12-29 MED ORDER — NITROGLYCERIN 0.4 MG/SPRAY TL SOLN: 1.0000 | Status: DC | PRN

## 2012-12-29 NOTE — Discharge Summary (Signed)
Patient ID: Briana Bowers MRN: [id number] DOB/AGE: 04/15/1950 62 y.o.  Admit date: 12/27/2012 Discharge date: 12/29/2012  Patient Active Problem List   Diagnosis Date Noted  . Unstable angina pectoris 12/28/2012    Priority: High    Class: Acute  . CAD (coronary artery disease), native coronary artery 12/27/2012    Priority: Medium    Class: Chronic  . Prediabetes 12/27/2012    Priority: Medium    Class: Chronic  . Gastroesophageal reflux disease 12/27/2012    Priority: Medium    Class: Chronic  . Anxiety disorder 12/27/2012    Priority: Medium    Class: Chronic  . Hyperlipidemia 12/27/2012    Priority: Low    Class: Chronic  . Degenerative joint disease 12/27/2012    Priority: Low  . Chronic fatigue 12/27/2012    Priority: Low    Class: Chronic   Primary Discharge Diagnosis: Left arm pain as manifestation of unstable angina.  Secondary Discharge Diagnosis: Coronary artery disease Chest pain Hypertension Prediabetes Anxiety disorder Gastroesophageal reflux  Significant Diagnostic Studies: Coronary angiography, FFR determination, and DES LAD  Consults: None  Hospital Course: The patient presented with atypical symptoms that included severe left arm pain waxing and waning in severity that was "just like the pain I had with a heart attack". She was admitted to the hospital and serial cardiac markers were performed. A stress Cardiolite study was ordered but the patient refused after the second of 3 troponin values was mildly elevated. Coronary angiography was performed and an intermediate stenosis was noted in the proximal to mid LAD. FFR was 0.79. DES implantation in the LAD was performed without complications.  The recovery was uneventful. Overnight and this morning the patient complains of twinges of discomfort in the left chest that radiated into her axilla. She states the severe arm discomfort has not recurred. The chest discomfort, according to the patient has  chronic since her previous stent." I  always have this chest discomfort after a stent is placed".   She has wall with cardiac rehabilitation. There was no exertion-related chest discomfort. She denied dyspnea.  Discharge Exam: Blood pressure 106/40, pulse 66, temperature 98.3 F (36.8 C), temperature source Oral, resp. rate 20, height 5\' 2"  (1.575 m), weight 65.7 kg (144 lb 13.5 oz), SpO2 95.00%.    The right radial catheter site is unremarkable.  The lungs are clear. There is no palpable chest wall or tenderness.  Cardiac exam reveals no rub.  Labs:   Lab Results  Component Value Date   WBC 5.7 12/29/2012   HGB 12.5 12/29/2012   HCT 36.6 12/29/2012   MCV 87.4 12/29/2012   PLT 208 12/29/2012    Recent Labs Lab 12/27/12 1920 12/29/12 0400  NA 139 138  K 4.4 3.8  CL 103 103  CO2 26 26  BUN 14 9  CREATININE 0.67 0.68  CALCIUM 9.9 8.6  PROT 7.2  --   BILITOT 0.5  --   ALKPHOS 57  --   ALT 55*  --   AST 61*  --   GLUCOSE 142* 117*   Lab Results  Component Value Date   CKTOTAL 74 10/31/2008   CKMB 1.3 10/31/2008   TROPONINI <0.30 12/28/2012    Lab Results  Component Value Date   CHOL  Value: 135        ATP III CLASSIFICATION:  <200     mg/dL   Desirable  308-657  mg/dL   Borderline High  >=846  mg/dL   High        1/61/0960   Lab Results  Component Value Date   HDL 68 10/31/2008   Lab Results  Component Value Date   LDLCALC  Value: 55        Total Cholesterol/HDL:CHD Risk Coronary Heart Disease Risk Table                     Men   Women  1/2 Average Risk   3.4   3.3  Average Risk       5.0   4.4  2 X Average Risk   9.6   7.1  3 X Average Risk  23.4   11.0        Use the calculated Patient Ratio above and the CHD Risk Table to determine the patient's CHD Risk.        ATP III CLASSIFICATION (LDL):  <100     mg/dL   Optimal  454-098  mg/dL   Near or Above                    Optimal  130-159  mg/dL   Borderline  119-147  mg/dL   High  >829     mg/dL   Very High 5/62/1308    Lab Results  Component Value Date   TRIG 58 10/31/2008   Lab Results  Component Value Date   CHOLHDL 2.0 10/31/2008   No results found for this basename: LDLDIRECT      Radiology: No acute abnormality EKG: poor R-wave progression  FOLLOW UP PLANS AND APPOINTMENTS  Future Appointments Provider Department Dept Phone   04/22/2013 11:30 AM Everette Rank, MD [facility] [phone]       Medication List    STOP taking these medications       aspirin EC 325 MG tablet     B-complex with vitamin C tablet      TAKE these medications       estrogen-methylTESTOSTERone 1.25-2.5 MG per tablet  Commonly known as:  ESTRATEST  Take 1 tablet by mouth at bedtime.     meloxicam 15 MG tablet  Commonly known as:  MOBIC  Take 15 mg by mouth daily as needed for pain.     nitroGLYCERIN 0.4 MG/SPRAY spray  Commonly known as:  NITROLINGUAL  Place 1 spray under the tongue every 5 (five) minutes as needed for chest pain.     ranitidine 150 MG tablet  Commonly known as:  ZANTAC  Take 150 mg by mouth 2 (two) times daily.     rosuvastatin 20 MG tablet  Commonly known as:  CRESTOR  Take 20 mg by mouth at bedtime.     Ticagrelor 90 MG Tabs tablet  Commonly known as:  BRILINTA  Take 1 tablet (90 mg total) by mouth 2 (two) times daily.     TURMERIC PO  Take 1 tablet by mouth 2 (two) times daily. 800 mg           Follow-up Information   Follow up with Corky Crafts., MD On 01/06/2013. (11 AM)    Specialty:  Cardiology   Contact information:   [address] [place] [address] [phone]       BRING ALL MEDICATIONS WITH YOU TO FOLLOW UP APPOINTMENTS  Time spent with patient to include physician time: 31 minute. SignedLesleigh Noe 12/29/2012, 9:06 AM

## 2012-12-29 NOTE — Progress Notes (Signed)
CARDIAC REHAB PHASE I   PRE:  Rate/Rhythm: 71 SR  BP:  Supine: 103/40  Sitting:   Standing:    SaO2:   MODE:  Ambulation: 200 ft   POST:  Rate/Rhythm: 82 SR  BP:  Supine:  Sitting: 118/59  Standing:    SaO2:  0454-0981 On arrival pt in bed without c/o. Assisted X 1 to ambulate. In hall pt c/o of nausea and feeling funny in her head. Had pt to stop and rest. She was able to walk back to room. BP on return to room 118/59.Pt states that her nausea was slowly resolving. Completed stent education with pt. She voices understanding. She declines Outpt. CRP not interested.  Melina Copa RN 12/29/2012 9:20 AM

## 2013-01-18 ENCOUNTER — Telehealth: Payer: Self-pay | Admitting: Interventional Cardiology

## 2013-01-18 DIAGNOSIS — I2510 Atherosclerotic heart disease of native coronary artery without angina pectoris: Secondary | ICD-10-CM

## 2013-01-18 NOTE — Telephone Encounter (Signed)
Pt called because she has been having chest pain on and off. The pain is same as before she had the angioplastic 3 weeks ago. The pain radiates to the left arm .Pt states the pain has not gone completely away, but today the pain is worse. Pt states the same happened in 1999 when she had a heart attack. and Dr. Katrinka Blazing changed her  Medication. Dr Ethelle Lyon aware of pt's symptoms and recommends for pt to have a Cardiolite.Pt is aware but states can't walk in the treadmill because of medical reasons, so Md agreed for pt to have a [employer]. A Lexiscan order in EPIC. Pt is aware that the scheduler will call her to schedule this test.

## 2013-01-18 NOTE — Telephone Encounter (Signed)
New problem:  Pt states she is having chest pain.. Pt states she had a angio-plasty 3 weeks ago. Pt states she is feeling a little SOB. Pt would like to speak to a nurse

## 2013-01-19 ENCOUNTER — Encounter (HOSPITAL_COMMUNITY): Payer: Self-pay | Admitting: Emergency Medicine

## 2013-01-19 ENCOUNTER — Emergency Department (HOSPITAL_COMMUNITY): Payer: Medicare Other

## 2013-01-19 ENCOUNTER — Telehealth: Payer: Self-pay | Admitting: Interventional Cardiology

## 2013-01-19 ENCOUNTER — Observation Stay (HOSPITAL_COMMUNITY)
Admission: EM | Admit: 2013-01-19 | Discharge: 2013-01-20 | Disposition: A | Discharge status: Home / Self Care | Payer: Medicare Other | Attending: Emergency Medicine | Admitting: Emergency Medicine

## 2013-01-19 DIAGNOSIS — R0602 Shortness of breath: Secondary | ICD-10-CM | POA: Insufficient documentation

## 2013-01-19 DIAGNOSIS — R5382 Chronic fatigue, unspecified: Secondary | ICD-10-CM

## 2013-01-19 DIAGNOSIS — Z79899 Other long term (current) drug therapy: Secondary | ICD-10-CM | POA: Insufficient documentation

## 2013-01-19 DIAGNOSIS — F411 Generalized anxiety disorder: Secondary | ICD-10-CM | POA: Diagnosis present

## 2013-01-19 DIAGNOSIS — F419 Anxiety disorder, unspecified: Secondary | ICD-10-CM | POA: Diagnosis present

## 2013-01-19 DIAGNOSIS — E785 Hyperlipidemia, unspecified: Secondary | ICD-10-CM | POA: Diagnosis present

## 2013-01-19 DIAGNOSIS — Z9861 Coronary angioplasty status: Secondary | ICD-10-CM | POA: Insufficient documentation

## 2013-01-19 DIAGNOSIS — I2510 Atherosclerotic heart disease of native coronary artery without angina pectoris: Secondary | ICD-10-CM | POA: Diagnosis present

## 2013-01-19 DIAGNOSIS — R079 Chest pain, unspecified: Principal | ICD-10-CM | POA: Insufficient documentation

## 2013-01-19 DIAGNOSIS — R5381 Other malaise: Secondary | ICD-10-CM

## 2013-01-19 DIAGNOSIS — I252 Old myocardial infarction: Secondary | ICD-10-CM | POA: Insufficient documentation

## 2013-01-19 DIAGNOSIS — I200 Unstable angina: Secondary | ICD-10-CM

## 2013-01-19 HISTORY — DX: Chest pain, unspecified: R07.9

## 2013-01-19 LAB — PRO B NATRIURETIC PEPTIDE: Pro B Natriuretic peptide (BNP): 56.4 pg/mL (ref 0–125)

## 2013-01-19 LAB — CBC
MCH: 29.1 pg (ref 26.0–34.0)
MCHC: 33.7 g/dL (ref 30.0–36.0)
Platelets: 247 10*3/uL (ref 150–400)

## 2013-01-19 LAB — TROPONIN I
Troponin I: <0.3 ng/mL (ref <0.30)
Troponin I: <0.3 ng/mL (ref <0.30)

## 2013-01-19 LAB — BASIC METABOLIC PANEL
BUN: 12 mg/dL (ref 6–23)
Calcium: 9.2 mg/dL (ref 8.4–10.5)
GFR calc non Af Amer: >90 mL/min (ref >90)
Glucose, Bld: 71 mg/dL (ref 70–99)
Sodium: 137 mEq/L (ref 135–145)

## 2013-01-19 LAB — POCT I-STAT TROPONIN I

## 2013-01-19 MED ORDER — METAXALONE 800 MG PO TABS
800.0000 mg | ORAL_TABLET | Freq: Two times a day (BID) | ORAL | Status: DC | PRN
  Administered 2013-01-20: 800 mg via ORAL
  Filled 2013-01-19 (×2): qty 1

## 2013-01-19 MED ORDER — CLOPIDOGREL BISULFATE 75 MG PO TABS
75.0000 mg | ORAL_TABLET | Freq: Every day | ORAL | Status: DC
  Administered 2013-01-19: 75 mg via ORAL
  Filled 2013-01-19: qty 1

## 2013-01-19 MED ORDER — ONDANSETRON HCL 4 MG/2ML IJ SOLN: 4.0000 mg | Freq: Four times a day (QID) | INTRAMUSCULAR | Status: DC | PRN

## 2013-01-19 MED ORDER — HEPARIN SODIUM (PORCINE) 5000 UNIT/ML IJ SOLN
5000.0000 [IU] | Freq: Three times a day (TID) | INTRAMUSCULAR | Status: DC
  Administered 2013-01-19 – 2013-01-20 (×2): 5000 [IU] via SUBCUTANEOUS
  Filled 2013-01-19 (×5): qty 1

## 2013-01-19 MED ORDER — EST ESTROGENS-METHYLTEST 1.25-2.5 MG PO TABS: 1.0000 | ORAL_TABLET | Freq: Every day | ORAL | Status: DC

## 2013-01-19 MED ORDER — ASPIRIN 300 MG RE SUPP
300.0000 mg | RECTAL | Status: DC
  Filled 2013-01-19: qty 1

## 2013-01-19 MED ORDER — ATORVASTATIN CALCIUM 40 MG PO TABS
40.0000 mg | ORAL_TABLET | Freq: Every day | ORAL | Status: DC
  Filled 2013-01-19: qty 1

## 2013-01-19 MED ORDER — PANTOPRAZOLE SODIUM 40 MG PO TBEC
40.0000 mg | DELAYED_RELEASE_TABLET | Freq: Two times a day (BID) | ORAL | Status: DC
  Administered 2013-01-19 – 2013-01-20 (×2): 40 mg via ORAL
  Filled 2013-01-19 (×2): qty 1

## 2013-01-19 MED ORDER — MORPHINE SULFATE 2 MG/ML IJ SOLN
2.0000 mg | Freq: Once | INTRAMUSCULAR | Status: AC
  Administered 2013-01-19: 2 mg via INTRAVENOUS
  Filled 2013-01-19: qty 1

## 2013-01-19 MED ORDER — ASPIRIN 81 MG PO CHEW
81.0000 mg | CHEWABLE_TABLET | Freq: Every day | ORAL | Status: DC
  Filled 2013-01-19: qty 1

## 2013-01-19 MED ORDER — MORPHINE SULFATE 4 MG/ML IJ SOLN
4.0000 mg | Freq: Once | INTRAMUSCULAR | Status: AC
  Administered 2013-01-19: 4 mg via INTRAVENOUS
  Filled 2013-01-19: qty 1

## 2013-01-19 MED ORDER — ASPIRIN EC 81 MG PO TBEC: 81.0000 mg | DELAYED_RELEASE_TABLET | Freq: Every day | ORAL | Status: DC

## 2013-01-19 MED ORDER — NITROGLYCERIN 0.4 MG SL SUBL: 0.4000 mg | SUBLINGUAL_TABLET | SUBLINGUAL | Status: DC | PRN

## 2013-01-19 MED ORDER — ASPIRIN 81 MG PO CHEW: 324.0000 mg | CHEWABLE_TABLET | ORAL | Status: DC

## 2013-01-19 MED ORDER — ONDANSETRON HCL 4 MG/2ML IJ SOLN
4.0000 mg | Freq: Once | INTRAMUSCULAR | Status: AC
  Administered 2013-01-19: 4 mg via INTRAVENOUS
  Filled 2013-01-19: qty 2

## 2013-01-19 MED ORDER — NITROGLYCERIN 0.4 MG SL SUBL
0.4000 mg | SUBLINGUAL_TABLET | SUBLINGUAL | Status: AC | PRN
  Administered 2013-01-19 (×3): 0.4 mg via SUBLINGUAL
  Filled 2013-01-19 (×3): qty 25

## 2013-01-19 MED ORDER — ASPIRIN 81 MG PO CHEW
324.0000 mg | CHEWABLE_TABLET | Freq: Once | ORAL | Status: AC
  Administered 2013-01-19: 324 mg via ORAL
  Filled 2013-01-19: qty 4

## 2013-01-19 MED ORDER — ZOLPIDEM TARTRATE 5 MG PO TABS: 5.0000 mg | ORAL_TABLET | Freq: Every evening | ORAL | Status: DC | PRN

## 2013-01-19 MED ORDER — ALPRAZOLAM 0.25 MG PO TABS
0.2500 mg | ORAL_TABLET | Freq: Two times a day (BID) | ORAL | Status: DC | PRN
  Administered 2013-01-20: 0.25 mg via ORAL
  Filled 2013-01-19: qty 1

## 2013-01-19 MED ORDER — NITROGLYCERIN 0.4 MG/SPRAY TL SOLN
1.0000 | Status: DC | PRN
  Administered 2013-01-20 (×2): 1 via SUBLINGUAL
  Filled 2013-01-19: qty 4.9

## 2013-01-19 MED ORDER — ACETAMINOPHEN 325 MG PO TABS: 650.0000 mg | ORAL_TABLET | ORAL | Status: DC | PRN

## 2013-01-19 NOTE — Telephone Encounter (Signed)
I spoke with Dr Eldridge Dace and he would like the pt to go to the [facility] ER for evaluation.  I made the pt aware of this information and she will have some one drive her to the ER. Hospital team notified that pt will be coming to the ER.

## 2013-01-19 NOTE — Telephone Encounter (Signed)
NEW PROB.   Pt called stating CP pt took twice 11:20am waited a few min and took another.  Scaled 7 or 8

## 2013-01-19 NOTE — Telephone Encounter (Signed)
I spoke with the pt and she complains of left arm and shoulder pain and chest pressure that feels exactly like her MI and angioplasty.  The pt states that she has had intermittent left arm pain off and on since her discharge from the hospital in September. Today the pt's symptoms will not resolve.  The pt rates her pain as 7/8. The pt has used 2 NTG sprays this morning with a slight decrease in pain for a short time and then symptoms begin to escalate again. The pt denies nausea, vomiting, dizziness but does have SOB with exertion.  The pt did speak with a nurse yesterday in our office and has been scheduled for a nuclear stress test. I will speak with Dr Eldridge Dace about this pt.

## 2013-01-19 NOTE — ED Notes (Signed)
incorrect charting earlier on pt RR. Not 61

## 2013-01-19 NOTE — ED Notes (Signed)
Per pt sts left sided chest pain radiating down left arm. sts nausea and feels like she is going to pass out. Sent here by doctor varanassi

## 2013-01-19 NOTE — ED Provider Notes (Signed)
CSN: [id number]     Arrival date & time 01/19/13  1315 History   First MD Initiated Contact with Patient 01/19/13 1334     Chief Complaint  Patient presents with  . Chest Pain   (Consider location/radiation/quality/duration/timing/severity/associated sxs/prior Treatment) HPI Comments: 62 year old female presents with recurrent left sided chest pressure and left arm pain. She states started this morning about 5 or 5 and a half hours ago. Has been waxing and waning and is currently a 7 at 10. Feels like pressure over her left chest and breast. This is exactly like she's had myocardial infarctions. She had a stent placed a few weeks ago and has had some intermittent symptoms since then but this is the worst. She is also having some nausea and feeling like she is going to pass out. He called her cardiologist's office who recommended she come in to the ED and are likely keep her overnight and get her Cardiolite stress test in the hospital as opposed to a couple weeks.  The history is provided by the patient.    Past Medical History  Diagnosis Date  . Arthritis     Degenerative joint disease  . CAD (coronary artery disease), native coronary artery 12/27/2012    RCA stent in the setting of non-ST elevation MI in 1999   . Gastroesophageal reflux disease 12/27/2012  . Anxiety disorder 12/27/2012  . Chronic fatigue 12/27/2012  . High cholesterol   . Myocardial infarction 08/02/1997  . Anginal pain     "just now": (12/27/2012)  . Pneumonia 1950, 1951, 1952  . History of blood transfusion     w/tonsillectomy; w/spinal fusion  . H/O hiatal hernia   . Chronic lower back pain    Past Surgical History  Procedure Laterality Date  . Partial hysterectomy  1974  . Appendectomy  1965  . Tonsillectomy  1964  . Cholecystectomy  08/1989  . Back surgery    . Ganglion cyst excision Right 1990's  . Ligament repair Right 1990's    "radial collateral" (12/27/2012)  . Hernia repair  02/2002    "ventral"  (12/27/2012)  . Coronary angioplasty with stent placement  08/03/1997  . Cardiac catheterization  2000's    "couple times" (12/27/2012)  . Lumbar laminectomy  1989; 1990's    "twice" (12/27/2012)  . Posterior lumbar fusion  04/2002  . Bilateral salpingoophorectomy Bilateral 1978    "2 separate ORs; months apart" (12/27/2012)  . Dilation and curettage of uterus  09/1967    "after miscarriage" (12/27/2012)  . Mass excision Left 1990's    "groin" (12/27/2012)  . Tympanoplasty Bilateral 1966; 2001    "left; right" (12/27/2012)  . Nasal septum surgery  1969  . Finger arthroplasty Right 12/232011    "ring finger; fused it; put pin in it; 2nd joint" (12/27/2012)  . 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 Coronary artery disease Mother   . Coronary artery disease Father    History  Substance Use Topics  . Smoking status: Former Smoker -- 1.50 packs/day for 17 years    Types: Cigarettes    Quit date: 12/27/1981  . Smokeless tobacco: Never Used  . Alcohol Use: No   OB History   Grav Para Term Preterm Abortions TAB SAB Ect Mult Living  Review of Systems  Constitutional: Negative for fever and chills.  Respiratory: Negative for shortness of breath.   Cardiovascular: Positive for chest pain. Negative for leg swelling.  Gastrointestinal: Positive for nausea. Negative for vomiting and abdominal pain.  Neurological: Positive for light-headedness. Negative for weakness.  All other systems reviewed and are negative.    Allergies  Atorvastatin; Ciprofloxacin; Levaquin; and Dilaudid  Home Medications   Current Outpatient Rx  Name  Route  Sig  Dispense  Refill  . estrogen-methylTESTOSTERone (ESTRATEST) 1.25-2.5 MG per tablet   Oral   Take 1 tablet by mouth at bedtime.         . meloxicam (MOBIC) 15 MG tablet   Oral   Take 15 mg by mouth daily as needed for  pain.         . nitroGLYCERIN (NITROLINGUAL) 0.4 MG/SPRAY spray   Sublingual   Place 1 spray under the tongue every 5 (five) minutes as needed for chest pain.   12 g   12   . ranitidine (ZANTAC) 150 MG tablet   Oral   Take 150 mg by mouth 2 (two) times daily.         . rosuvastatin (CRESTOR) 20 MG tablet   Oral   Take 20 mg by mouth at bedtime.         . Ticagrelor (BRILINTA) 90 MG TABS tablet   Oral   Take 1 tablet (90 mg total) by mouth 2 (two) times daily.   60 tablet   11     Dispense as written.   . TURMERIC PO   Oral   Take 1 tablet by mouth 2 (two) times daily. 800 mg          BP 125/58  Pulse 60  Temp(Src) 98.1 F (36.7 C)  Resp 61  SpO2 98% Physical Exam  Nursing note and vitals reviewed. Constitutional: She is oriented to person, place, and time. She appears well-developed and well-nourished.  HENT:  Head: Normocephalic and atraumatic.  Right Ear: External ear normal.  Left Ear: External ear normal.  Nose: Nose normal.  Eyes: Right eye exhibits no discharge. Left eye exhibits no discharge.  Cardiovascular: Normal rate, regular rhythm and normal heart sounds.   Pulmonary/Chest: Effort normal and breath sounds normal. She has no rales. She exhibits no tenderness.  No pain with range of motion of shoulder  Abdominal: Soft. There is no tenderness.  Musculoskeletal: She exhibits no edema and no tenderness.  Neurological: She is alert and oriented to person, place, and time.  Skin: Skin is warm and dry.    ED Course  Procedures (including critical care time) Labs Review Labs Reviewed  BASIC METABOLIC PANEL  CBC  PRO B NATRIURETIC PEPTIDE  TROPONIN I  POCT I-STAT TROPONIN I    Date: 01/19/2013  Rate: 58  Rhythm: sinus bradycardia  QRS Axis: normal  Intervals: normal  ST/T Wave abnormalities: normal  Conduction Disutrbances:none  Narrative Interpretation:   Old EKG Reviewed: unchanged   Imaging Review Dg Chest 2 View  01/19/2013    CLINICAL DATA:  Chest pain and shortness of breath  EXAM: CHEST  2 VIEW  COMPARISON:  12/27/2012  FINDINGS: The heart size and mediastinal contours are within normal limits. Coronary stents are noted. Both lungs are clear. The visualized skeletal structures are unremarkable.  IMPRESSION: No acute abnormality noted.   Electronically Signed   By: Alcide Clever M.D.   On: 01/19/2013 14:28    MDM   1.  Unstable angina pectoris    Since symptoms consistent with recurrent anginal pain. She just recently had a stent placed within the last few weeks. She's given aspirin, nitroglycerin, and morphine here. Patient's pain waxed and waned in the ED. Consulted cardiology, they will admit for further evaluation.    Audree Camel, MD 01/19/13 [phone]

## 2013-01-19 NOTE — H&P (Signed)
Admit date: 01/19/2013 Primary Physician  Johny Blamer, MD Primary Cardiologist  Dr. Eldridge Dace  CC: Chest pain  HPI: 62 year-old female with coronary artery disease status post DES to LAD on 12/28/12 with normal left ventricular function, anxiety disorder, hyperlipidemia, chronic fatigue here with chest discomfort. She describes left-sided chest pain radiating down her left arms with symptoms of nausea and sensation of feeling as though she is going to pass out. She was sent here by her primary cardiologist, Dr. Eldridge Dace according to nursing note.  The left arm and shoulder pain and chest pressure feels exactly like her myocardial infarction and previous angioplasty symptoms. She has had intermittent left pain off and on since her discharge from the hospital in September. Today the symptoms did not resolve. They're rated at approximately 7/10. She's used 2 nitroglycerin sprays this morning with slight decrease in pain for a very short time and then her symptoms began to escalate again and worried her. She's not had any significant nausea, vomiting, dizziness but she may have some shortness of breath with exertion. She had been scheduled for a nuclear stress test in the outpatient setting.  Interestingly, her chest discomfort is somewhat exacerbated by laying on the left side of her chest. Her EKG did not appear to have any evidence of paracarditis.   PMH:   Past Medical History  Diagnosis Date  . Arthritis     Degenerative joint disease  . CAD (coronary artery disease), native coronary artery 12/27/2012    RCA stent in the setting of non-ST elevation MI in 1999   . Gastroesophageal reflux disease 12/27/2012  . Anxiety disorder 12/27/2012  . Chronic fatigue 12/27/2012  . High cholesterol   . Myocardial infarction 08/02/1997  . Anginal pain     "just now": (12/27/2012)  . Pneumonia 1950, 1951, 1952  . History of blood transfusion     w/tonsillectomy; w/spinal fusion  . H/O hiatal hernia     . Chronic lower back pain     PSH:   Past Surgical History  Procedure Laterality Date  . Partial hysterectomy  1974  . Appendectomy  1965  . Tonsillectomy  1964  . Cholecystectomy  08/1989  . Back surgery    . Ganglion cyst excision Right 1990's  . Ligament repair Right 1990's    "radial collateral" (12/27/2012)  . Hernia repair  02/2002    "ventral" (12/27/2012)  . Coronary angioplasty with stent placement  08/03/1997  . Cardiac catheterization  2000's    "couple times" (12/27/2012)  . Lumbar laminectomy  1989; 1990's    "twice" (12/27/2012)  . Posterior lumbar fusion  04/2002  . Bilateral salpingoophorectomy Bilateral 1978    "2 separate ORs; months apart" (12/27/2012)  . Dilation and curettage of uterus  09/1967    "after miscarriage" (12/27/2012)  . Mass excision Left 1990's    "groin" (12/27/2012)  . Tympanoplasty Bilateral 1966; 2001    "left; right" (12/27/2012)  . Nasal septum surgery  1969  . Finger arthroplasty Right 12/232011    "ring finger; fused it; put pin in it; 2nd joint" (12/27/2012)  . 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 Authorizing Provider  aspirin 81 MG tablet Take 81 mg by mouth daily.   Yes Historical Provider,  MD  clopidogrel (PLAVIX) 75 MG tablet Take 75 mg by mouth at bedtime.   Yes Historical Provider, MD  estrogen-methylTESTOSTERone (ESTRATEST) 1.25-2.5 MG per tablet Take 1 tablet by mouth at bedtime.   Yes Historical Provider, MD  metaxalone (SKELAXIN) 800 MG tablet Take 800 mg by mouth 2 (two) times daily as needed for pain.   Yes Historical Provider, MD  nitroGLYCERIN (NITROLINGUAL) 0.4 MG/SPRAY spray Place 1 spray under the tongue every 5 (five) minutes as needed for chest pain. 12/29/12  Yes Lyn Records III, MD   pantoprazole (PROTONIX) 40 MG tablet Take 40 mg by mouth 2 (two) times daily.   Yes Historical Provider, MD  rosuvastatin (CRESTOR) 20 MG tablet Take 20 mg by mouth at bedtime.   Yes Historical Provider, MD  TURMERIC PO Take 1 tablet by mouth 2 (two) times daily. 800 mg   Yes Historical Provider, MD   Fam HX:    Family History  Problem Relation Age of Onset  . Coronary artery disease Mother   . Coronary artery disease Father    Social HX:    History   Social History  . Marital Status: Married    Spouse Name: N/A    Number of Children: N/A  . Years of Education: N/A   Occupational History  . Not on file.   Social History Main Topics  . Smoking status: Former Smoker -- 1.50 packs/day for 17 years    Types: Cigarettes    Quit date: 12/27/1981  . Smokeless tobacco: Never Used  . Alcohol Use: No  . Drug Use: No  . Sexual Activity: Yes   Other Topics Concern  . Not on file   Social History Narrative  . No narrative on file     ROS: Denies any strokelike symptoms, fevers, cough, chills, orthopnea, PND, rash, dysphagia. All 11 ROS were addressed and are negative except what is stated in the HPI   Physical Exam: Blood pressure 92/54, pulse 57, temperature 98.1 F (36.7 C), resp. rate 16, SpO2 96.00%.   General: Well developed, well nourished, in no acute distress, laying flat, comfortable, hat in place, daughter sitting next to her Head: Eyes PERRLA, No xanthomas.   Normal cephalic and atramatic  Lungs:  Clear bilaterally to auscultation and percussion. Normal respiratory effort. No wheezes, no rales. Heart:  HRRR S1 S2 Pulses are 2+ & equal. No murmurs, rubs or gallops.             No carotid bruit. No JVD.  No abdominal bruits. Abdomen: Bowel sounds are positive, abdomen soft and non-tender without masses No hepatosplenomegaly. Msk:  Back normal, normal gait. Normal strength and tone for age. Extremities:  No clubbing, cyanosis or edema.  DP +1 Neuro: Alert and  oriented X 3, non-focal, MAE x 4 GU: Deferred Rectal: Deferred Psych:  Good affect, responds appropriately         Labs:   Lab Results  Component Value Date   WBC 8.4 01/19/2013   HGB 14.1 01/19/2013   HCT 41.8 01/19/2013   MCV 86.2 01/19/2013   PLT 247 01/19/2013    Recent Labs Lab 01/19/13 1320  NA 137  K 3.9  CL 101  CO2 25  BUN 12  CREATININE 0.64  CALCIUM 9.2  GLUCOSE 71   No results found for this basename: CKTOTAL, CKMB, TROPONINI,  in the last 72 hours Lab Results  Component Value Date   CHOL  Value: 135  ATP III CLASSIFICATION:  <200     mg/dL   Desirable  409-811  mg/dL   Borderline High  >=914    mg/dL   High        7/82/9562   HDL 68 10/31/2008   LDLCALC  Value: 55        Total Cholesterol/HDL:CHD Risk Coronary Heart Disease Risk Table                     Men   Women  1/2 Average Risk   3.4   3.3  Average Risk       5.0   4.4  2 X Average Risk   9.6   7.1  3 X Average Risk  23.4   11.0        Use the calculated Patient Ratio above and the CHD Risk Table to determine the patient's CHD Risk.        ATP III CLASSIFICATION (LDL):  <100     mg/dL   Optimal  130-865  mg/dL   Near or Above                    Optimal  130-159  mg/dL   Borderline  784-696  mg/dL   High  >295     mg/dL   Very High 2/84/1324   TRIG 58 10/31/2008   No results found for this basename: DDIMER    Troponin is normal  Radiology:  Dg Chest 2 View  01/19/2013   CLINICAL DATA:  Chest pain and shortness of breath  EXAM: CHEST  2 VIEW  COMPARISON:  12/27/2012  FINDINGS: The heart size and mediastinal contours are within normal limits. Coronary stents are noted. Both lungs are clear. The visualized skeletal structures are unremarkable.  IMPRESSION: No acute abnormality noted.   Electronically Signed   By: Alcide Clever M.D.   On: 01/19/2013 14:28   Personally viewed.   EKG:  Current EKG demonstrates sinus rhythm with no ST segment changes. There is no evidence of T-wave inversion in inferior  leads. Previous from 12/29/12 shows T wave inversion in the inferior leads, consider ischemia, sinus rhythm. Personally viewed.  ASSESSMENT/PLAN:   62 year old female with coronary artery disease status post successful DES implantation in the proximal LAD on 12/28/12 here with chest discomfort, atypical. Other problems include hyperlipidemia, chronic fatigue, anxiety.  -We will place on telemetry and observe overnight. -Continue to cycle cardiac markers -Continue with Plavix and other secondary prevention medications -She had intolerance in the past to Brilinta -I have explained to her that Dr. Eldridge Dace may wish to perform stress test if troponin/EKG remained normal.  She reminded me that her EKGs in the past have been normal. Interestingly, the last EKG in system showed T-wave inversion in the inferior leads. -Her symptoms may in fact be musculoskeletal. I doubt pericarditis. Low likelihood for pulmonary embolism. Chest x-ray unremarkable. -Her blood pressure is somewhat low. No syncope, no fevers. She's not on any antihypertensive medication. This could also be exacerbated by recent nitroglycerin use. Encouraged hydration. -I will make n.p.o. for possible stress test in morning. I will go ahead and order this.   Donato Schultz, MD  01/19/2013  4:45 PM

## 2013-01-20 ENCOUNTER — Encounter (HOSPITAL_COMMUNITY)
Admission: EM | Disposition: A | Discharge status: Home / Self Care | Payer: Self-pay | Source: Home / Self Care | Attending: Emergency Medicine

## 2013-01-20 ENCOUNTER — Encounter (HOSPITAL_COMMUNITY): Payer: Self-pay | Admitting: Interventional Cardiology

## 2013-01-20 DIAGNOSIS — R079 Chest pain, unspecified: Principal | ICD-10-CM | POA: Insufficient documentation

## 2013-01-20 DIAGNOSIS — I2510 Atherosclerotic heart disease of native coronary artery without angina pectoris: Secondary | ICD-10-CM

## 2013-01-20 HISTORY — PX: LEFT HEART CATHETERIZATION WITH CORONARY ANGIOGRAM: SHX5451

## 2013-01-20 LAB — BASIC METABOLIC PANEL
Calcium: 8.7 mg/dL (ref 8.4–10.5)
GFR calc Af Amer: >90 mL/min (ref >90)
GFR calc non Af Amer: 89 mL/min — ABNORMAL LOW (ref >90)
Glucose, Bld: 74 mg/dL (ref 70–99)
Sodium: 143 mEq/L (ref 135–145)

## 2013-01-20 LAB — TROPONIN I
Troponin I: <0.3 ng/mL (ref <0.30)
Troponin I: <0.3 ng/mL (ref <0.30)

## 2013-01-20 LAB — CBC
MCH: 29.1 pg (ref 26.0–34.0)
MCHC: 33.6 g/dL (ref 30.0–36.0)
Platelets: 226 10*3/uL (ref 150–400)
WBC: 6.7 10*3/uL (ref 4.0–10.5)

## 2013-01-20 LAB — LIPID PANEL
Cholesterol: 136 mg/dL (ref 0–200)
LDL Cholesterol: 81 mg/dL (ref 0–99)
Total CHOL/HDL Ratio: 3.6 RATIO
Triglycerides: 85 mg/dL (ref <150)
VLDL: 17 mg/dL (ref 0–40)

## 2013-01-20 SURGERY — LEFT HEART CATHETERIZATION WITH CORONARY ANGIOGRAM
Anesthesia: LOCAL

## 2013-01-20 MED ORDER — HYDROCODONE-ACETAMINOPHEN 5-325 MG PO TABS: 1.0000 | ORAL_TABLET | Freq: Four times a day (QID) | ORAL | Status: DC | PRN

## 2013-01-20 MED ORDER — SODIUM CHLORIDE 0.9 % IJ SOLN: 3.0000 mL | INTRAMUSCULAR | Status: DC | PRN

## 2013-01-20 MED ORDER — HEPARIN (PORCINE) IN NACL 2-0.9 UNIT/ML-% IJ SOLN
INTRAMUSCULAR | Status: AC
  Filled 2013-01-20: qty 1000

## 2013-01-20 MED ORDER — SODIUM CHLORIDE 0.9 % IV SOLN
INTRAVENOUS | Status: DC
  Administered 2013-01-20: 12:00:00 via INTRAVENOUS

## 2013-01-20 MED ORDER — VERAPAMIL HCL 2.5 MG/ML IV SOLN
INTRAVENOUS | Status: AC
  Filled 2013-01-20: qty 2

## 2013-01-20 MED ORDER — MORPHINE SULFATE 2 MG/ML IJ SOLN
2.0000 mg | Freq: Once | INTRAMUSCULAR | Status: AC
  Administered 2013-01-20: 2 mg via INTRAVENOUS

## 2013-01-20 MED ORDER — NITROGLYCERIN 0.2 MG/ML ON CALL CATH LAB
INTRAVENOUS | Status: AC
  Filled 2013-01-20: qty 1

## 2013-01-20 MED ORDER — MIDAZOLAM HCL 2 MG/2ML IJ SOLN
INTRAMUSCULAR | Status: AC
  Filled 2013-01-20: qty 2

## 2013-01-20 MED ORDER — MORPHINE SULFATE 2 MG/ML IJ SOLN
1.0000 mg | Freq: Once | INTRAMUSCULAR | Status: DC
  Filled 2013-01-20: qty 1

## 2013-01-20 MED ORDER — SODIUM CHLORIDE 0.9 % IJ SOLN: 3.0000 mL | Freq: Two times a day (BID) | INTRAMUSCULAR | Status: DC

## 2013-01-20 MED ORDER — SODIUM CHLORIDE 0.9 % IV SOLN: 250.0000 mL | INTRAVENOUS | Status: DC | PRN

## 2013-01-20 MED ORDER — HEPARIN SODIUM (PORCINE) 1000 UNIT/ML IJ SOLN
INTRAMUSCULAR | Status: AC
  Filled 2013-01-20: qty 1

## 2013-01-20 MED ORDER — LIDOCAINE HCL (PF) 1 % IJ SOLN
INTRAMUSCULAR | Status: AC
  Filled 2013-01-20: qty 30

## 2013-01-20 MED ORDER — FENTANYL CITRATE 0.05 MG/ML IJ SOLN
INTRAMUSCULAR | Status: AC
  Filled 2013-01-20: qty 2

## 2013-01-20 NOTE — Discharge Summary (Signed)
Patient ID: Briana Bowers MRN: [id number] DOB/AGE: [birth year] 62 y.o.  Admit date: 01/19/2013 Discharge date: 01/20/2013  Primary Discharge Diagnosis  Chest pain, possible GERD Secondary Discharge Diagnosis CAD  Significant Diagnostic Studies: angiography: cardiac cath showing no significant disease on 01/20/13  Consults: None  Hospital Course: 62 y/o woman with CAD, LAD stent a few weeks ago who had severe chest pain.  She was scheduled for a stress test but the CP came back so she went to the ER.  She ruled out for MI and CP persisted so a repeat cath was done.  She reported relief with skelaxin and requested Vicodin.  No bleeding issues at her wrist.     Discharge Exam: Blood pressure 123/62, pulse 65, temperature 98.1 F (36.7 C), temperature source Oral, resp. rate 20, weight 145 lb 9.6 oz (66.044 kg), SpO2 98.00%.   /AT RRRS1S2 CTA bilaterally Nondistended No edema Labs:   Lab Results  Component Value Date   WBC 6.7 01/20/2013   HGB 13.2 01/20/2013   HCT 39.3 01/20/2013   MCV 86.6 01/20/2013   PLT 226 01/20/2013    Recent Labs Lab 01/20/13 0540  NA 143  K 4.2  CL 105  CO2 29  BUN 10  CREATININE 0.72  CALCIUM 8.7  GLUCOSE 74   Lab Results  Component Value Date   CKTOTAL 74 10/31/2008   CKMB 1.3 10/31/2008   TROPONINI <0.30 01/20/2013    Lab Results  Component Value Date   CHOL 136 01/20/2013   CHOL  Value: 135        ATP III CLASSIFICATION:  <200     mg/dL   Desirable  409-811  mg/dL   Borderline High  >=914    mg/dL   High        7/82/9562   Lab Results  Component Value Date   HDL 38* 01/20/2013   HDL 68 10/31/2008   Lab Results  Component Value Date   LDLCALC 81 01/20/2013   LDLCALC  Value: 55        Total Cholesterol/HDL:CHD Risk Coronary Heart Disease Risk Table                     Men   Women  1/2 Average Risk   3.4   3.3  Average Risk       5.0   4.4  2 X Average Risk   9.6   7.1  3 X Average Risk  23.4   11.0        Use the calculated Patient Ratio  above and the CHD Risk Table to determine the patient's CHD Risk.        ATP III CLASSIFICATION (LDL):  <100     mg/dL   Optimal  130-865  mg/dL   Near or Above                    Optimal  130-159  mg/dL   Borderline  784-696  mg/dL   High  >295     mg/dL   Very High 2/84/1324   Lab Results  Component Value Date   TRIG 85 01/20/2013   TRIG 58 10/31/2008   Lab Results  Component Value Date   CHOLHDL 3.6 01/20/2013   CHOLHDL 2.0 10/31/2008   No results found for this basename: LDLDIRECT      Radiology: normal EKG:NSR, old anterior MI  FOLLOW UP PLANS AND APPOINTMENTS  Future Appointments Provider Department Dept Phone  01/27/2013 8:30 AM [facility] 2 Irven Shelling) [facility] SITE 3 NUCLEAR MED [phone]   04/22/2013 11:30 AM Everette Rank, MD [facility] [phone]       Medication List         aspirin 81 MG tablet  Take 81 mg by mouth daily.     clopidogrel 75 MG tablet  Commonly known as:  PLAVIX  Take 75 mg by mouth at bedtime.     estrogen-methylTESTOSTERone 1.25-2.5 MG per tablet  Commonly known as:  ESTRATEST  Take 1 tablet by mouth at bedtime.     HYDROcodone-acetaminophen 5-325 MG per tablet  Commonly known as:  NORCO/VICODIN  Take 1 tablet by mouth every 6 (six) hours as needed.     metaxalone 800 MG tablet  Commonly known as:  SKELAXIN  Take 800 mg by mouth 2 (two) times daily as needed for pain.     nitroGLYCERIN 0.4 MG/SPRAY spray  Commonly known as:  NITROLINGUAL  Place 1 spray under the tongue every 5 (five) minutes as needed for chest pain.     pantoprazole 40 MG tablet  Commonly known as:  PROTONIX  Take 40 mg by mouth 2 (two) times daily.     rosuvastatin 20 MG tablet  Commonly known as:  CRESTOR  Take 20 mg by mouth at bedtime.     TURMERIC PO  Take 1 tablet by mouth 2 (two) times daily. 800 mg           Follow-up Information   Follow up with Corky Crafts., MD. (as scheduled)    Specialty:   Cardiology   Contact information:   [address] [place] [address] [phone]       BRING ALL MEDICATIONS WITH YOU TO FOLLOW UP APPOINTMENTS  Time spent with patient to include physician time:40 minutes coordinating cath and discharge medications with patient and nurse Signed: Corky Crafts. 01/20/2013, 5:33 PM

## 2013-01-20 NOTE — Progress Notes (Signed)
Pt c/o of 7 out of 10 lt sided cp radiating to the LA. EKG was done & showed NSR. VS were WNL and are charted. Pt was given SL nitro times two without any relief. Cardiologist on-call was notified. Morphine 2mg  was ordered. After the 2 mg of morphine pt's CP went down to 2/10. Pt's next troponin due around 0000. Will continue to monitor the pt. Sanda Linger, RN

## 2013-01-20 NOTE — Progress Notes (Signed)
SUBJECTIVE:  Patient was pain-free last night after morphine and Xanax were given. This morning she reports 9-10 out of 10 chest pain. No radiation, no diaphoresis. She was scheduled for nuclear stress test this morning. There've in no tissues with rhythm instability.  OBJECTIVE:   Vitals:   Filed Vitals:   01/20/13 0247 01/20/13 0535 01/20/13 0726 01/20/13 0733  BP: 130/57 104/52 118/61 129/63  Pulse: 63 58  64  Temp:  97.9 F (36.6 C)    TempSrc:  Oral    Resp: 18     Weight:  145 lb 9.6 oz (66.044 kg)    SpO2: 98% 97%     I&O's:   Intake/Output Summary (Last 24 hours) at 01/20/13 8295 Last data filed at 01/20/13 0310  Gross per 24 hour  Intake      0 ml  Output    650 ml  Net   -650 ml   TELEMETRY: Reviewed telemetry pt in normal sinus rhythm:     PHYSICAL EXAM General: Well developed, well nourished, in no acute distress Head:   Normal cephalic and atramatic  Lungs:  No wheezing Heart:  HRRR  Abdomen: Bowel sounds are positive, abdomen soft and non-tender   Extremities:   No edema.  3+ right radial pulse Neuro: Alert and oriented X 3. Psych: Flat affect, responds appropriately   LABS: Basic Metabolic Panel:  Recent Labs  62/13/08 1320 01/20/13 0540  NA 137 143  K 3.9 4.2  CL 101 105  CO2 25 29  GLUCOSE 71 74  BUN 12 10  CREATININE 0.64 0.72  CALCIUM 9.2 8.7   Liver Function Tests: No results found for this basename: AST, ALT, ALKPHOS, BILITOT, PROT, ALBUMIN,  in the last 72 hours No results found for this basename: LIPASE, AMYLASE,  in the last 72 hours CBC:  Recent Labs  01/19/13 1320 01/20/13 0540  WBC 8.4 6.7  HGB 14.1 13.2  HCT 41.8 39.3  MCV 86.2 86.6  PLT 247 226   Cardiac Enzymes:  Recent Labs  01/19/13 1818 01/20/13 0205 01/20/13 0540  TROPONINI <0.30 <0.30 <0.30   BNP: No components found with this basename: POCBNP,  D-Dimer: No results found for this basename: DDIMER,  in the last 72 hours Hemoglobin A1C: No results  found for this basename: HGBA1C,  in the last 72 hours Fasting Lipid Panel:  Recent Labs  01/20/13 0540  CHOL 136  HDL 38*  LDLCALC 81  TRIG 85  CHOLHDL 3.6   Thyroid Function Tests: No results found for this basename: TSH, T4TOTAL, FREET3, T3FREE, THYROIDAB,  in the last 72 hours Anemia Panel: No results found for this basename: VITAMINB12, FOLATE, FERRITIN, TIBC, IRON, RETICCTPCT,  in the last 72 hours Coag Panel:   Lab Results  Component Value Date   INR 0.95 12/27/2012   INR 1.0 10/17/2008    RADIOLOGY: Dg Chest 2 View  01/19/2013   CLINICAL DATA:  Chest pain and shortness of breath  EXAM: CHEST  2 VIEW  COMPARISON:  12/27/2012  FINDINGS: The heart size and mediastinal contours are within normal limits. Coronary stents are noted. Both lungs are clear. The visualized skeletal structures are unremarkable.  IMPRESSION: No acute abnormality noted.   Electronically Signed   By: Alcide Clever M.D.   On: 01/19/2013 14:28   Dg Chest 2 View  12/27/2012   *RADIOLOGY REPORT*  Clinical Data: Chest pain.  CHEST - 2 VIEW  Comparison: 03/30/2012  Findings: Relatively low lung volumes with  some crowding of bibasilar bronchovascular structures.  No confluent airspace consolidation or overt edema.  No effusion.  Vascular clips in the right upper abdomen.  Heart size normal.  Regional bones unremarkable.  IMPRESSION:  Low volumes.  No acute disease.   Original Report Authenticated By: D. Andria Rhein, MD      ASSESSMENT: Recurrent chest pain in a patient who has a recent LAD stent  PLAN:  There is no objective evidence of ischemia. Her ECG is normal. Her troponins are negative. Given her severe discomfort she reports, we cannot do a stress test on her at this time. We'll plan for cardiac cath. He is in agreement. She did not enjoy her experience with Dr. Katrinka Blazing at her last hospital admission. I've spoken to the cath lab and they will find another interventionalists to do her heart catheterization as I  am in the office today. Continue aggressive secondary prevention including Plavix given her recent drug-eluting stent. I explained her that if her heart cath is negative, noncardiac sources of chest discomfort would have to be investigated. She is in agreement.  Corky Crafts., MD  01/20/2013  9:24 AM

## 2013-01-20 NOTE — Progress Notes (Signed)
Patient's TR band removed and site level 1, due to small amount of swelling above the wrist after band removed.  VSS and Allen's test positive.  Pressure held and pressure dressing applied.  Cath lab notified to assess patient.  Will continue to monitor. [place], Mitzi Hansen

## 2013-01-20 NOTE — Progress Notes (Signed)
Utilization review completed.  

## 2013-01-20 NOTE — Progress Notes (Signed)
Patient c/o 8/10 sharp CP with radiation down left arm. VSS, 2L of supplemental O2 applied, 2 sprays of nitro given with no relief.  EKG obtained showing no acute changes.  Dr. Katrinka Blazing notified.  Will continue to monitor. [place], Mitzi Hansen

## 2013-01-20 NOTE — H&P (View-Only) (Signed)
SUBJECTIVE:  Patient was pain-free last night after morphine and Xanax were given. This morning she reports 9-10 out of 10 chest pain. No radiation, no diaphoresis. She was scheduled for nuclear stress test this morning. There've in no tissues with rhythm instability.  OBJECTIVE:   Vitals:   Filed Vitals:   01/20/13 0247 01/20/13 0535 01/20/13 0726 01/20/13 0733  BP: 130/57 104/52 118/61 129/63  Pulse: 63 58  64  Temp:  97.9 F (36.6 C)    TempSrc:  Oral    Resp: 18     Weight:  145 lb 9.6 oz (66.044 kg)    SpO2: 98% 97%     I&O's:   Intake/Output Summary (Last 24 hours) at 01/20/13 0924 Last data filed at 01/20/13 0310  Gross per 24 hour  Intake      0 ml  Output    650 ml  Net   -650 ml   TELEMETRY: Reviewed telemetry pt in normal sinus rhythm:     PHYSICAL EXAM General: Well developed, well nourished, in no acute distress Head:   Normal cephalic and atramatic  Lungs:  No wheezing Heart:  HRRR  Abdomen: Bowel sounds are positive, abdomen soft and non-tender   Extremities:   No edema.  3+ right radial pulse Neuro: Alert and oriented X 3. Psych: Flat affect, responds appropriately   LABS: Basic Metabolic Panel:  Recent Labs  01/19/13 1320 01/20/13 0540  NA 137 143  K 3.9 4.2  CL 101 105  CO2 25 29  GLUCOSE 71 74  BUN 12 10  CREATININE 0.64 0.72  CALCIUM 9.2 8.7   Liver Function Tests: No results found for this basename: AST, ALT, ALKPHOS, BILITOT, PROT, ALBUMIN,  in the last 72 hours No results found for this basename: LIPASE, AMYLASE,  in the last 72 hours CBC:  Recent Labs  01/19/13 1320 01/20/13 0540  WBC 8.4 6.7  HGB 14.1 13.2  HCT 41.8 39.3  MCV 86.2 86.6  PLT 247 226   Cardiac Enzymes:  Recent Labs  01/19/13 1818 01/20/13 0205 01/20/13 0540  TROPONINI <0.30 <0.30 <0.30   BNP: No components found with this basename: POCBNP,  D-Dimer: No results found for this basename: DDIMER,  in the last 72 hours Hemoglobin A1C: No results  found for this basename: HGBA1C,  in the last 72 hours Fasting Lipid Panel:  Recent Labs  01/20/13 0540  CHOL 136  HDL 38*  LDLCALC 81  TRIG 85  CHOLHDL 3.6   Thyroid Function Tests: No results found for this basename: TSH, T4TOTAL, FREET3, T3FREE, THYROIDAB,  in the last 72 hours Anemia Panel: No results found for this basename: VITAMINB12, FOLATE, FERRITIN, TIBC, IRON, RETICCTPCT,  in the last 72 hours Coag Panel:   Lab Results  Component Value Date   INR 0.95 12/27/2012   INR 1.0 10/17/2008    RADIOLOGY: Dg Chest 2 View  01/19/2013   CLINICAL DATA:  Chest pain and shortness of breath  EXAM: CHEST  2 VIEW  COMPARISON:  12/27/2012  FINDINGS: The heart size and mediastinal contours are within normal limits. Coronary stents are noted. Both lungs are clear. The visualized skeletal structures are unremarkable.  IMPRESSION: No acute abnormality noted.   Electronically Signed   By: Mark  Lukens M.D.   On: 01/19/2013 14:28   Dg Chest 2 View  12/27/2012   *RADIOLOGY REPORT*  Clinical Data: Chest pain.  CHEST - 2 VIEW  Comparison: 03/30/2012  Findings: Relatively low lung volumes with   some crowding of bibasilar bronchovascular structures.  No confluent airspace consolidation or overt edema.  No effusion.  Vascular clips in the right upper abdomen.  Heart size normal.  Regional bones unremarkable.  IMPRESSION:  Low volumes.  No acute disease.   Original Report Authenticated By: D. Hassell III, MD      ASSESSMENT: Recurrent chest pain in a patient who has a recent LAD stent  PLAN:  There is no objective evidence of ischemia. Her ECG is normal. Her troponins are negative. Given her severe discomfort she reports, we cannot do a stress test on her at this time. We'll plan for cardiac cath. He is in agreement. She did not enjoy her experience with Dr. Smith at her last hospital admission. I've spoken to the cath lab and they will find another interventionalists to do her heart catheterization as I  am in the office today. Continue aggressive secondary prevention including Plavix given her recent drug-eluting stent. I explained her that if her heart cath is negative, noncardiac sources of chest discomfort would have to be investigated. She is in agreement.  Hajar Penninger S., MD  01/20/2013  9:24 AM  

## 2013-01-20 NOTE — Interval H&P Note (Signed)
Cath Lab Visit (complete for each Cath Lab visit)  Clinical Evaluation Leading to the Procedure:   ACS: no  Non-ACS:    Anginal Classification: CCS III  Anti-ischemic medical therapy: No Therapy  Non-Invasive Test Results: No non-invasive testing performed  Prior CABG: No previous CABG      History and Physical Interval Note:  01/20/2013 11:23 AM  Briana Bowers  has presented today for surgery, with the diagnosis of cp  The various methods of treatment have been discussed with the patient and family. After consideration of risks, benefits and other options for treatment, the patient has consented to  Procedure(s): LEFT HEART CATHETERIZATION WITH CORONARY ANGIOGRAM (N/A) as a surgical intervention .  The patient's history has been reviewed, patient examined, no change in status, stable for surgery.  I have reviewed the patient's chart and labs.  Questions were answered to the patient's satisfaction.     Tonny Bollman

## 2013-01-20 NOTE — Progress Notes (Signed)
DC orders received.  Patient stable with no S/S of distress.  Medication and discharge information reviewed with patient and patient's family.  Patient DC home with family. Briana Bowers  

## 2013-01-20 NOTE — Progress Notes (Signed)
Pt c/o 5/10 lt sided cp radiating to LA. Cardiologist on call notified. Pt's vs stable & charted. No new ekg done. No changes on the cardiac monitor. New orders for 2 mg morphine. Pt was also given 0.25 mg of xanax. Troponin pending. Will continue to monitor the pt. Sanda Linger, RN

## 2013-01-20 NOTE — CV Procedure (Signed)
    Cardiac Catheterization Procedure Note  Name: Briana Bowers MRN: [id number] DOB: Sep 03, 1950  Procedure: Left Heart Cath, Selective Coronary Angiography, LV angiography  Indication: Chest pain, known CAD, recent LAD stent. Pt initially set-up for inpatient Myoview stress test but because of ongoing severe chest pain she was referred directly for cardiac cath.   Procedural Details: The right wrist was prepped, draped, and anesthetized with 1% lidocaine. Using the modified Seldinger technique, a 5 French sheath was introduced into the right radial artery. 3 mg of verapamil was administered through the sheath, weight-based unfractionated heparin was administered intravenously. Standard Judkins catheters were used for selective coronary angiography and left ventriculography. Catheter exchanges were performed over an exchange length guidewire. There were no immediate procedural complications. A TR band was used for radial hemostasis at the completion of the procedure.  The patient was transferred to the post catheterization recovery area for further monitoring.  Procedural Findings: Hemodynamics: AO 122/60 LV 125/20  Coronary angiography: Coronary dominance: right  Left mainstem: Widely patent. Arises from left cusp without significant disease  Left anterior descending (LAD): Prox LAD with 20-30% stenosis. The stented segment is widely patent without restenosis. The vessel reaches the LV apex without significant obstructive disease.  Left circumflex (LCx): Patent throughout. 2 OM branches have no obstructive disease.  Right coronary artery (RCA): normal caliber, dominant vessel. Mid-RCA stent is widely patent. There is no obstructive disease.  Left ventriculography: Left ventricular systolic function is normal, LVEF is estimated at 55-65%, there is no significant mitral regurgitation   Final Conclusions:   1. Continued patency of the stented segments in the proximal LAD and mid-RCA 2.  Normal LV function  Recommendations: continued medical therapy. Likely discharge home this afternoon.  Tonny Bollman 01/20/2013, 11:46 AM

## 2013-01-27 ENCOUNTER — Encounter (HOSPITAL_COMMUNITY): Payer: Medicare Other

## 2013-02-11 ENCOUNTER — Encounter: Payer: Self-pay | Admitting: Interventional Cardiology

## 2013-02-17 ENCOUNTER — Other Ambulatory Visit: Payer: Self-pay

## 2013-02-28 ENCOUNTER — Encounter (HOSPITAL_COMMUNITY): Payer: Self-pay | Admitting: Emergency Medicine

## 2013-02-28 ENCOUNTER — Emergency Department (HOSPITAL_COMMUNITY): Payer: Medicare Other

## 2013-02-28 ENCOUNTER — Telehealth: Payer: Self-pay | Admitting: Interventional Cardiology

## 2013-02-28 ENCOUNTER — Emergency Department (HOSPITAL_COMMUNITY)
Admission: EM | Admit: 2013-02-28 | Discharge: 2013-02-28 | Disposition: A | Discharge status: Home / Self Care | Payer: Medicare Other | Attending: Emergency Medicine | Admitting: Emergency Medicine

## 2013-02-28 DIAGNOSIS — M545 Low back pain, unspecified: Secondary | ICD-10-CM | POA: Insufficient documentation

## 2013-02-28 DIAGNOSIS — R0602 Shortness of breath: Secondary | ICD-10-CM | POA: Insufficient documentation

## 2013-02-28 DIAGNOSIS — Z8701 Personal history of pneumonia (recurrent): Secondary | ICD-10-CM | POA: Insufficient documentation

## 2013-02-28 DIAGNOSIS — G8929 Other chronic pain: Secondary | ICD-10-CM | POA: Insufficient documentation

## 2013-02-28 DIAGNOSIS — M129 Arthropathy, unspecified: Secondary | ICD-10-CM | POA: Insufficient documentation

## 2013-02-28 DIAGNOSIS — R079 Chest pain, unspecified: Secondary | ICD-10-CM | POA: Insufficient documentation

## 2013-02-28 DIAGNOSIS — E7800 Pure hypercholesterolemia, unspecified: Secondary | ICD-10-CM | POA: Insufficient documentation

## 2013-02-28 DIAGNOSIS — Z881 Allergy status to other antibiotic agents status: Secondary | ICD-10-CM | POA: Insufficient documentation

## 2013-02-28 DIAGNOSIS — Z7902 Long term (current) use of antithrombotics/antiplatelets: Secondary | ICD-10-CM | POA: Insufficient documentation

## 2013-02-28 DIAGNOSIS — F430 Acute stress reaction: Secondary | ICD-10-CM | POA: Insufficient documentation

## 2013-02-28 DIAGNOSIS — Z885 Allergy status to narcotic agent status: Secondary | ICD-10-CM | POA: Insufficient documentation

## 2013-02-28 DIAGNOSIS — R5381 Other malaise: Secondary | ICD-10-CM | POA: Insufficient documentation

## 2013-02-28 DIAGNOSIS — I4510 Unspecified right bundle-branch block: Secondary | ICD-10-CM | POA: Insufficient documentation

## 2013-02-28 DIAGNOSIS — M79609 Pain in unspecified limb: Secondary | ICD-10-CM | POA: Insufficient documentation

## 2013-02-28 DIAGNOSIS — Z87891 Personal history of nicotine dependence: Secondary | ICD-10-CM | POA: Insufficient documentation

## 2013-02-28 DIAGNOSIS — Z9189 Other specified personal risk factors, not elsewhere classified: Secondary | ICD-10-CM | POA: Insufficient documentation

## 2013-02-28 DIAGNOSIS — R9431 Abnormal electrocardiogram [ECG] [EKG]: Secondary | ICD-10-CM | POA: Insufficient documentation

## 2013-02-28 DIAGNOSIS — Z7982 Long term (current) use of aspirin: Secondary | ICD-10-CM | POA: Insufficient documentation

## 2013-02-28 DIAGNOSIS — I2510 Atherosclerotic heart disease of native coronary artery without angina pectoris: Secondary | ICD-10-CM | POA: Insufficient documentation

## 2013-02-28 DIAGNOSIS — Z8679 Personal history of other diseases of the circulatory system: Secondary | ICD-10-CM | POA: Insufficient documentation

## 2013-02-28 DIAGNOSIS — I252 Old myocardial infarction: Secondary | ICD-10-CM | POA: Insufficient documentation

## 2013-02-28 DIAGNOSIS — Z79899 Other long term (current) drug therapy: Secondary | ICD-10-CM | POA: Insufficient documentation

## 2013-02-28 DIAGNOSIS — Z8719 Personal history of other diseases of the digestive system: Secondary | ICD-10-CM | POA: Insufficient documentation

## 2013-02-28 LAB — CBC
HCT: 40.9 % (ref 36.0–46.0)
Hemoglobin: 14.1 g/dL (ref 12.0–15.0)
MCHC: 34.5 g/dL (ref 30.0–36.0)
Platelets: 251 10*3/uL (ref 150–400)
RBC: 4.71 MIL/uL (ref 3.87–5.11)
WBC: 6.2 10*3/uL (ref 4.0–10.5)

## 2013-02-28 LAB — BASIC METABOLIC PANEL
BUN: 13 mg/dL (ref 6–23)
Calcium: 9.7 mg/dL (ref 8.4–10.5)
Chloride: 103 mEq/L (ref 96–112)
GFR calc Af Amer: 83 mL/min — ABNORMAL LOW (ref >90)
GFR calc non Af Amer: 72 mL/min — ABNORMAL LOW (ref >90)
Glucose, Bld: 112 mg/dL — ABNORMAL HIGH (ref 70–99)
Potassium: 4.7 mEq/L (ref 3.5–5.1)
Sodium: 141 mEq/L (ref 135–145)

## 2013-02-28 LAB — POCT I-STAT TROPONIN I

## 2013-02-28 MED ORDER — ASPIRIN 81 MG PO CHEW
324.0000 mg | CHEWABLE_TABLET | Freq: Once | ORAL | Status: AC
  Administered 2013-02-28: 324 mg via ORAL
  Filled 2013-02-28: qty 4

## 2013-02-28 MED ORDER — NITROGLYCERIN 0.4 MG SL SUBL
0.4000 mg | SUBLINGUAL_TABLET | SUBLINGUAL | Status: DC | PRN
  Administered 2013-02-28: 0.4 mg via SUBLINGUAL
  Filled 2013-02-28: qty 25

## 2013-02-28 NOTE — Telephone Encounter (Signed)
Spoke with pt and he nausea is better. Pt was started on Nortriptyline 50 mg by her PCP for the CP. Pt states she got up today and took her husband to the [facility]. Pt states if she does any sort of exertion she has CP and then a pain that shoots down her left arm. Pt states her heart was contracting every 2 minutes this monring. Pt is currently having chest tightness and arm pain.

## 2013-02-28 NOTE — Telephone Encounter (Signed)
New message    Talk to Amy about still having chest pain on a regular basis.  This has been going on for a while.

## 2013-02-28 NOTE — ED Provider Notes (Signed)
CSN: [id number]     Arrival date & time 02/28/13  1425 History   First MD Initiated Contact with Patient 02/28/13 1529     Chief Complaint  Patient presents with  . Chest Pain   (Consider location/radiation/quality/duration/timing/severity/associated sxs/prior Treatment) HPI Comments: Patient presents to the ER for evaluation of chest pain. Patient reports that she has pain under her left breast, but the left upper chest and down the left arm. Patient reports that she had cardiac stenting performed on October 9 and has had pain ever since the procedure. She has not followed up at cardiology yet, appointment is in the future.  Patient reports that although she's been having daily pain since the stenting, painted a became much worse. She does report that the pain is only intermittently present since she had her stent, has not identified any exacerbating factors. Today, however, at 6 AM she had severely increased pain associated with shortness of breath. Symptoms worsened walking and movement.  Patient is a 62 y.o. female presenting with chest pain.  Chest Pain Associated symptoms: shortness of breath     Past Medical History  Diagnosis Date  . Arthritis     Degenerative joint disease  . CAD (coronary artery disease), native coronary artery 12/27/2012    RCA stent in the setting of non-ST elevation MI in 1999   . Gastroesophageal reflux disease 12/27/2012  . Anxiety disorder 12/27/2012  . Chronic fatigue 12/27/2012  . High cholesterol   . Myocardial infarction 08/02/1997  . Anginal pain     "just now": (12/27/2012)  . Pneumonia 1950, 1951, 1952  . History of blood transfusion     w/tonsillectomy; w/spinal fusion  . H/O hiatal hernia   . Chronic lower back pain   . Chest pain, unspecified    Past Surgical History  Procedure Laterality Date  . Partial hysterectomy  1974  . Appendectomy  1965  . Tonsillectomy  1964  . Cholecystectomy  08/1989  . Back surgery    . Ganglion cyst  excision Right 1990's  . Ligament repair Right 1990's    "radial collateral" (12/27/2012)  . Hernia repair  02/2002    "ventral" (12/27/2012)  . Lumbar laminectomy  1989; 1990's    "twice" (12/27/2012)  . Posterior lumbar fusion  04/2002  . Bilateral salpingoophorectomy Bilateral 1978    "2 separate ORs; months apart" (12/27/2012)  . Dilation and curettage of uterus  09/1967    "after miscarriage" (12/27/2012)  . Mass excision Left 1990's    "groin" (12/27/2012)  . Tympanoplasty Bilateral 1966; 2001    "left; right" (12/27/2012)  . Nasal septum surgery  1969  . Finger arthroplasty Right 12/232011    "ring finger; fused it; put pin in it; 2nd joint" (12/27/2012)  . 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 Coronary angioplasty with stent placement  08/03/1997; 12/28/2012    1 + 1" (01/19/2013)  . Cardiac catheterization  2000's    "couple times" (12/27/2012)   Family History  Problem Relation Age of Onset  . Coronary artery disease Mother   . Coronary artery disease Father    History  Substance Use Topics  . Smoking status: Former Smoker -- 1.00 packs/day for 17 years    Types: Cigarettes    Quit date: 12/27/1981  . Smokeless tobacco: Never Used  . Alcohol Use: No   OB History   Grav Para Term Preterm Abortions TAB  SAB Ect Mult Living                 Review of Systems  Respiratory: Positive for shortness of breath.   Cardiovascular: Positive for chest pain.  All other systems reviewed and are negative.    Allergies  Atorvastatin; Ciprofloxacin; Levaquin; and Dilaudid  Home Medications   Current Outpatient Rx  Name  Route  Sig  Dispense  Refill  . acidophilus (RISAQUAD) CAPS capsule   Oral   Take 1 capsule by mouth daily.         Marland Kitchen aspirin 81 MG tablet   Oral   Take 81 mg by mouth daily.         Marland Kitchen b complex vitamins capsule   Oral   Take 1 capsule by mouth daily.         . bisoprolol (ZEBETA) 10  MG tablet   Oral   Take 10 mg by mouth as needed.         . Cholecalciferol (VITAMIN D3 PO)   Oral   Take 1 tablet by mouth daily.         . clopidogrel (PLAVIX) 75 MG tablet   Oral   Take 75 mg by mouth at bedtime.         . Coenzyme Q10 (CO Q 10 PO)   Oral   Take 1 tablet by mouth daily.         Marland Kitchen estrogen-methylTESTOSTERone (ESTRATEST) 1.25-2.5 MG per tablet   Oral   Take 1 tablet by mouth at bedtime.         . fish oil-omega-3 fatty acids 1000 MG capsule   Oral   Take 1 g by mouth daily.         Marland Kitchen HYDROcodone-acetaminophen (NORCO/VICODIN) 5-325 MG per tablet   Oral   Take 1 tablet by mouth every 6 (six) hours as needed.   30 tablet   0   . metaxalone (SKELAXIN) 800 MG tablet   Oral   Take 800 mg by mouth 2 (two) times daily as needed for pain.         . nitroGLYCERIN (NITROLINGUAL) 0.4 MG/SPRAY spray   Sublingual   Place 1 spray under the tongue every 5 (five) minutes as needed for chest pain.   12 g   12   . pantoprazole (PROTONIX) 40 MG tablet   Oral   Take 40 mg by mouth daily.          . rosuvastatin (CRESTOR) 20 MG tablet   Oral   Take 20 mg by mouth at bedtime.         . TURMERIC PO   Oral   Take 1 tablet by mouth 2 (two) times daily. 800 mg          BP 133/73  Pulse 77  Temp(Src) 97.8 F (36.6 C) (Oral)  Resp 20  Wt 145 lb (65.772 kg)  SpO2 96% Physical Exam  Constitutional: She is oriented to person, place, and time. She appears well-developed and well-nourished. No distress.  HENT:  Head: Normocephalic and atraumatic.  Right Ear: Hearing normal.  Left Ear: Hearing normal.  Nose: Nose normal.  Mouth/Throat: Oropharynx is clear and moist and mucous membranes are normal.  Eyes: Conjunctivae and EOM are normal. Pupils are equal, round, and reactive to light.  Neck: Normal range of motion. Neck supple.  Cardiovascular: Regular rhythm, S1 normal and S2 normal.  Exam reveals no gallop and no friction rub.  No murmur  heard. Pulmonary/Chest: Effort normal and breath sounds normal. No respiratory distress. She exhibits no tenderness.  Abdominal: Soft. Normal appearance and bowel sounds are normal. There is no hepatosplenomegaly. There is no tenderness. There is no rebound, no guarding, no tenderness at McBurney's point and negative Murphy's sign. No hernia.  Musculoskeletal: Normal range of motion.  Neurological: She is alert and oriented to person, place, and time. She has normal strength. No cranial nerve deficit or sensory deficit. Coordination normal. GCS eye subscore is 4. GCS verbal subscore is 5. GCS motor subscore is 6.  Skin: Skin is warm, dry and intact. No rash noted. No cyanosis.  Psychiatric: She has a normal mood and affect. Her speech is normal and behavior is normal. Thought content normal.    ED Course  Procedures (including critical care time) Labs Review Labs Reviewed  BASIC METABOLIC PANEL - Abnormal; Notable for the following:    Glucose, Bld 112 (*)    GFR calc non Af Amer 72 (*)    GFR calc Af Amer 83 (*)    All other components within normal limits  CBC  POCT I-STAT TROPONIN I   Imaging Review Dg Chest 2 View  02/28/2013   CLINICAL DATA:  Left-sided chest pain.  Short of breath.  EXAM: CHEST  2 VIEW  COMPARISON:  01/19/2013  FINDINGS: The heart size and mediastinal contours are within normal limits. Both lungs are clear. The visualized skeletal structures are unremarkable.  IMPRESSION: No active cardiopulmonary disease.   Electronically Signed   By: Amie Portland M.D.   On: 02/28/2013 15:00    EKG Interpretation    Date/Time:  Monday February 28 2013 14:29:20 EST Ventricular Rate:  79 PR Interval:  188 QRS Duration: 98 QT Interval:  372 QTC Calculation: 426 R Axis:   60 Text Interpretation:  Normal sinus rhythm Possible Left atrial enlargement Incomplete right bundle branch block Anterior infarct , age undetermined Abnormal ECG            MDM  Diagnosis:  Atypical chest pain  Patient presents to ER for evaluation of chest pain. Patient reports recent hospitalization for stenting. I reviewed the records, however, she actually had stenting in September and had a repeat hospitalization with catheterization that she left out when she told me her history. The second cardiac catheterization one month ago showed widely patent coronary arteries. It was therefore determined that the pain she was having every single day was noncardiac in origin. She continues to have this pain, presents today because the pain worsened. Pain is related to movements and positions. EKG was unchanged from previous. Troponin was negative.  Case was discussed with Doctor Anne Fu, on call for the patient's cardiologist, Doctor Eldridge Dace. He did confirm that the patient could have a second troponin and would be safe to discharge for outpatient followup.  I discussed this plan with the patient, she became somewhat belligerent. I tried to carefully explain to her that she was not having angina because of her most recent cardiac catheterization, which was performed because of this same pain and was normal. This prompted her to tell me that she knew she was having angina and to tell me I do not know what I was talking about. Because of this, I offered her admission, but she declined. It is not clear what the patient's expectations are. I do not feel that there is any cardiac reason for her pain, and Cardiology feels the same. Patient will need to follow  up with her primary doctor or cardiologist for treatment of her non-cardiac chest pain.  Gilda Crease, MD 02/28/13 [phone]

## 2013-02-28 NOTE — ED Notes (Signed)
Pt upset during discharge.  Upset that she could not be told what the source of her pain was "my arm feels like it is going to fall off.  I know I am not having a heart attack but there is something wrong.  These doctors here are idiots."  EDP made aware of pt's comments during discharge.

## 2013-02-28 NOTE — ED Notes (Signed)
Pt states since 0600 started having pain to left chest, under breast, and down left arm.  Pt had stent placed a couple of weeks ago.

## 2013-02-28 NOTE — Telephone Encounter (Signed)
Per Dr Eldridge Dace pt should go to the Ed. I spoke with pt and she is currently driving. Pt states she will head directly to [facility] ED.

## 2013-02-28 NOTE — Telephone Encounter (Signed)
I will check with Dr. Eldridge Dace and see what the next step is.

## 2013-03-21 ENCOUNTER — Encounter: Payer: Self-pay | Admitting: Interventional Cardiology

## 2013-03-22 ENCOUNTER — Ambulatory Visit (INDEPENDENT_AMBULATORY_CARE_PROVIDER_SITE_OTHER): Payer: Medicare Other | Admitting: Interventional Cardiology

## 2013-03-22 ENCOUNTER — Encounter: Payer: Self-pay | Admitting: Interventional Cardiology

## 2013-03-22 VITALS — BP 118/72 | HR 90 | Ht 62.0 in | Wt 152.0 lb

## 2013-03-22 DIAGNOSIS — R079 Chest pain, unspecified: Secondary | ICD-10-CM

## 2013-03-22 DIAGNOSIS — I2510 Atherosclerotic heart disease of native coronary artery without angina pectoris: Secondary | ICD-10-CM

## 2013-03-22 DIAGNOSIS — E785 Hyperlipidemia, unspecified: Secondary | ICD-10-CM

## 2013-03-22 DIAGNOSIS — K219 Gastro-esophageal reflux disease without esophagitis: Secondary | ICD-10-CM

## 2013-03-22 NOTE — Patient Instructions (Signed)
Your physician wants you to follow-up in: 1 year with Dr. Varanasi. You will receive a reminder letter in the mail two months in advance. If you don't receive a letter, please call our office to schedule the follow-up appointment.  Your physician recommends that you continue on your current medications as directed. Please refer to the Current Medication list given to you today.  

## 2013-03-22 NOTE — Progress Notes (Signed)
Patient ID: Briana Bowers, female   DOB: 02-15-1951, 62 y.o.   MRN: [id number]    [address] [place], [place]  [address] Phone: [phone] Fax:  [phone]  Date:  03/22/2013   ID:  Briana Bowers, DOB [birth year], MRN [id number]  PCP:  Johny Blamer, MD      History of Present Illness: Briana Bowers is a 62 y.o. female  who has coronary artery disease. She had a LAD stent placed several months ago. She has an RCA stent for many years ago. She is had persistent chest discomfort after this most recent stent. This prompted repeat catheterization. Her stents were found to be patent. Despite this, she continues to have a left-sided squeezing in her chest. It is not resolved with nortriptyline. She has had this type of pain in the past. She continues to have some generalized weakness. She feels she has not bounced back as well from the stent as she did in the past.   Wt Readings from Last 3 Encounters:  03/22/13 152 lb (68.947 kg)  02/28/13 145 lb (65.772 kg)  01/20/13 145 lb 9.6 oz (66.044 kg)     Past Medical History  Diagnosis Date  . Arthritis     Degenerative joint disease  . CAD (coronary artery disease), native coronary artery 12/27/2012    RCA stent in the setting of non-ST elevation MI in 1999   . Gastroesophageal reflux disease 12/27/2012  . Anxiety disorder 12/27/2012  . Chronic fatigue 12/27/2012  . High cholesterol   . Myocardial infarction 08/02/1997  . Anginal pain     "just now": (12/27/2012)  . Pneumonia 1950, 1951, 1952  . History of blood transfusion     w/tonsillectomy; w/spinal fusion  . H/O hiatal hernia   . Chronic lower back pain   . Chest pain, unspecified     Current Outpatient Prescriptions  Medication Sig Dispense Refill  . acidophilus (RISAQUAD) CAPS capsule Take 1 capsule by mouth daily.      Marland Kitchen aspirin 81 MG tablet Take 81 mg by mouth daily.      Marland Kitchen b complex vitamins capsule Take 1 capsule by mouth daily.      . bisoprolol (ZEBETA)  10 MG tablet Take 10 mg by mouth as needed.      . Cholecalciferol (VITAMIN D3 PO) Take 1 tablet by mouth daily.      . clopidogrel (PLAVIX) 75 MG tablet Take 75 mg by mouth at bedtime.      . Coenzyme Q10 (CO Q 10 PO) Take 1 tablet by mouth daily.      Marland Kitchen estrogen-methylTESTOSTERone (ESTRATEST) 1.25-2.5 MG per tablet Take 1 tablet by mouth at bedtime.      . fish oil-omega-3 fatty acids 1000 MG capsule Take 1 g by mouth daily.      Marland Kitchen HYDROcodone-acetaminophen (NORCO/VICODIN) 5-325 MG per tablet Take 1 tablet by mouth every 6 (six) hours as needed.  30 tablet  0  . metaxalone (SKELAXIN) 800 MG tablet Take 800 mg by mouth 2 (two) times daily as needed for pain.      . nitroGLYCERIN (NITROLINGUAL) 0.4 MG/SPRAY spray Place 1 spray under the tongue every 5 (five) minutes as needed for chest pain.  12 g  12  . pantoprazole (PROTONIX) 40 MG tablet Take 40 mg by mouth daily.       . rosuvastatin (CRESTOR) 20 MG tablet Take 20 mg by mouth at bedtime.      Marland Kitchen  TURMERIC PO Take 1 tablet by mouth 2 (two) times daily. 800 mg       No current facility-administered medications for this visit.    Allergies:    Allergies  Allergen Reactions  . Atorvastatin     Muscle soreness  . Ciprofloxacin Other (See Comments)    "makes me feel very weak"  . Levaquin [Levofloxacin In D5w] Other (See Comments)    "makes me feel very weak"  . Dilaudid [Hydromorphone Hcl]     Social History:  The patient  reports that she quit smoking about 31 years ago. Her smoking use included Cigarettes. She has a 17 pack-year smoking history. She has never used smokeless tobacco. She reports that she does not drink alcohol or use illicit drugs.   Family History:  The patient's family history includes Coronary artery disease in her father and mother.   ROS:  Please see the history of present illness.  No nausea, vomiting.  No fevers, chills.  No focal weakness.  No dysuria. Persistent chest pain.   All other systems reviewed and  negative.   PHYSICAL EXAM: VS:  BP 118/72  Pulse 90  Ht 5\' 2"  (1.575 m)  Wt 152 lb (68.947 kg)  BMI 27.79 kg/m2 Well nourished, well developed, in no acute distress HEENT: normal Neck: no JVD, no carotid bruits Cardiac:  normal S1, S2; RRR;  Lungs:  clear to auscultation bilaterally, no wheezing, rhonchi or rales Abd: soft, nontender, no hepatomegaly Ext: no edema, 2+ right radial pulse Skin: warm and dry Neuro:   no focal abnormalities noted  EKG:   Normal sinus rhythm, no significant ST T-wave changes   ASSESSMENT AND PLAN:  1. CAD: Cardiac cath in October showed widely patent stents in the RCA and LAD. Normal LV function normal hemodynamics as well. 2. Chest discomfort persists. It is described as a left-sided squeezing. It is not related to exertion. She has had this pain before and it was relieved with nortriptyline. For some reason, she is not getting the same relief from nortriptyline. She thinks it is  nerve pain. She is interested in other therapies for neuropathy. I mentioned Lyrica and gabapentin. I would like for her to discuss with Dr. Tiburcio Pea what may be the best treatment for this pain. I do not think it is cardiac. No further cardiac testing is needed given her recent cardiac cath. 3.  GERD: Symptoms are better since coming off of Brilinta.  They're not completely gone. Continue Protonix. 4. Hyperlipidemia: Last LDL 81 in October 2014. Well controlled. Continue current lipid-lowering therapy.  Signed, Fredric Mare, MD, [facility] 03/22/2013 10:53 AM

## 2013-04-22 ENCOUNTER — Ambulatory Visit: Payer: Medicare Other | Admitting: Interventional Cardiology

## 2013-05-23 ENCOUNTER — Other Ambulatory Visit: Payer: Self-pay | Admitting: Interventional Cardiology

## 2013-06-22 ENCOUNTER — Emergency Department (HOSPITAL_COMMUNITY)
Admission: EM | Admit: 2013-06-22 | Discharge: 2013-06-22 | Disposition: A | Discharge status: Home / Self Care | Payer: Medicare Other | Attending: Emergency Medicine | Admitting: Emergency Medicine

## 2013-06-22 ENCOUNTER — Emergency Department (HOSPITAL_COMMUNITY): Payer: Medicare Other

## 2013-06-22 ENCOUNTER — Encounter (HOSPITAL_COMMUNITY): Payer: Self-pay | Admitting: Emergency Medicine

## 2013-06-22 ENCOUNTER — Telehealth: Payer: Self-pay | Admitting: Interventional Cardiology

## 2013-06-22 DIAGNOSIS — Z8701 Personal history of pneumonia (recurrent): Secondary | ICD-10-CM | POA: Insufficient documentation

## 2013-06-22 DIAGNOSIS — Z90711 Acquired absence of uterus with remaining cervical stump: Secondary | ICD-10-CM | POA: Insufficient documentation

## 2013-06-22 DIAGNOSIS — Z9861 Coronary angioplasty status: Secondary | ICD-10-CM | POA: Insufficient documentation

## 2013-06-22 DIAGNOSIS — Z9889 Other specified postprocedural states: Secondary | ICD-10-CM | POA: Insufficient documentation

## 2013-06-22 DIAGNOSIS — I252 Old myocardial infarction: Secondary | ICD-10-CM | POA: Insufficient documentation

## 2013-06-22 DIAGNOSIS — K219 Gastro-esophageal reflux disease without esophagitis: Secondary | ICD-10-CM | POA: Insufficient documentation

## 2013-06-22 DIAGNOSIS — F411 Generalized anxiety disorder: Secondary | ICD-10-CM | POA: Insufficient documentation

## 2013-06-22 DIAGNOSIS — Z7902 Long term (current) use of antithrombotics/antiplatelets: Secondary | ICD-10-CM | POA: Insufficient documentation

## 2013-06-22 DIAGNOSIS — E7800 Pure hypercholesterolemia, unspecified: Secondary | ICD-10-CM | POA: Insufficient documentation

## 2013-06-22 DIAGNOSIS — Z9089 Acquired absence of other organs: Secondary | ICD-10-CM | POA: Insufficient documentation

## 2013-06-22 DIAGNOSIS — R0789 Other chest pain: Secondary | ICD-10-CM | POA: Insufficient documentation

## 2013-06-22 DIAGNOSIS — I2510 Atherosclerotic heart disease of native coronary artery without angina pectoris: Secondary | ICD-10-CM | POA: Insufficient documentation

## 2013-06-22 DIAGNOSIS — G8929 Other chronic pain: Secondary | ICD-10-CM | POA: Insufficient documentation

## 2013-06-22 DIAGNOSIS — Z7982 Long term (current) use of aspirin: Secondary | ICD-10-CM | POA: Insufficient documentation

## 2013-06-22 DIAGNOSIS — Z87891 Personal history of nicotine dependence: Secondary | ICD-10-CM | POA: Insufficient documentation

## 2013-06-22 DIAGNOSIS — Z79899 Other long term (current) drug therapy: Secondary | ICD-10-CM | POA: Insufficient documentation

## 2013-06-22 DIAGNOSIS — M129 Arthropathy, unspecified: Secondary | ICD-10-CM | POA: Insufficient documentation

## 2013-06-22 LAB — COMPREHENSIVE METABOLIC PANEL
ALK PHOS: 55 U/L (ref 39–117)
ALT: 19 U/L (ref 0–35)
AST: 22 U/L (ref 0–37)
Albumin: 3.9 g/dL (ref 3.5–5.2)
BILIRUBIN TOTAL: 0.3 mg/dL (ref 0.3–1.2)
BUN: 11 mg/dL (ref 6–23)
CHLORIDE: 96 meq/L (ref 96–112)
CO2: 29 mEq/L (ref 19–32)
Calcium: 9.5 mg/dL (ref 8.4–10.5)
Creatinine, Ser: 0.71 mg/dL (ref 0.50–1.10)
GFR calc Af Amer: >90 mL/min (ref >90)
GFR calc non Af Amer: 89 mL/min — ABNORMAL LOW (ref >90)
Glucose, Bld: 91 mg/dL (ref 70–99)
POTASSIUM: 4.8 meq/L (ref 3.7–5.3)
Sodium: 136 mEq/L — ABNORMAL LOW (ref 137–147)
Total Protein: 7.6 g/dL (ref 6.0–8.3)

## 2013-06-22 LAB — CBC WITH DIFFERENTIAL/PLATELET
BASOS ABS: 0 10*3/uL (ref 0.0–0.1)
BASOS PCT: 0 % (ref 0–1)
Eosinophils Absolute: 0.2 10*3/uL (ref 0.0–0.7)
Eosinophils Relative: 2 % (ref 0–5)
HCT: 42.1 % (ref 36.0–46.0)
Hemoglobin: 14.5 g/dL (ref 12.0–15.0)
Lymphocytes Relative: 37 % (ref 12–46)
Lymphs Abs: 2.6 10*3/uL (ref 0.7–4.0)
MCH: 30.7 pg (ref 26.0–34.0)
MCHC: 34.4 g/dL (ref 30.0–36.0)
MCV: 89 fL (ref 78.0–100.0)
Monocytes Absolute: 0.5 10*3/uL (ref 0.1–1.0)
Monocytes Relative: 7 % (ref 3–12)
NEUTROS ABS: 3.9 10*3/uL (ref 1.7–7.7)
NEUTROS PCT: 54 % (ref 43–77)
PLATELETS: 265 10*3/uL (ref 150–400)
RBC: 4.73 MIL/uL (ref 3.87–5.11)
RDW: 13.2 % (ref 11.5–15.5)
WBC: 7.2 10*3/uL (ref 4.0–10.5)

## 2013-06-22 LAB — I-STAT TROPONIN, ED
TROPONIN I, POC: 0 ng/mL (ref 0.00–0.08)
Troponin i, poc: 0 ng/mL (ref 0.00–0.08)

## 2013-06-22 MED ORDER — MORPHINE SULFATE 4 MG/ML IJ SOLN
4.0000 mg | Freq: Once | INTRAMUSCULAR | Status: AC
  Administered 2013-06-22: 4 mg via INTRAVENOUS
  Filled 2013-06-22: qty 1

## 2013-06-22 MED ORDER — MORPHINE SULFATE 4 MG/ML IJ SOLN: 4.0000 mg | Freq: Once | INTRAMUSCULAR | Status: DC

## 2013-06-22 MED ORDER — HYDROCODONE-ACETAMINOPHEN 5-325 MG PO TABS: 1.0000 | ORAL_TABLET | Freq: Four times a day (QID) | ORAL | Status: DC | PRN

## 2013-06-22 NOTE — Discharge Instructions (Signed)
Return here as needed.  Your testing here tonight did not show any heart damage.  Followup with your regular doctor and cardiologist

## 2013-06-22 NOTE — ED Provider Notes (Signed)
CSN: [id number]     Arrival date & time 06/22/13  1505 History   First MD Initiated Contact with Patient 06/22/13 1622     Chief Complaint  Patient presents with  . Chest Pain     (Consider location/radiation/quality/duration/timing/severity/associated sxs/prior Treatment) HPI Patient presents to the emergency department with chest pain.  This started at 11 AM.  The patient, states she took Vicodin and Skelaxin with relief of her pain.  Patient, states, that she feels, like this could be her chronic test pain, but could also be her heart.  She is unsure she did take a nitroglycerin without relief of her pain.  Patient, states, that she does not have any shortness of breath, nausea, vomiting, diaphoresis, fatigue, weakness, dizziness, back pain, neck pain, fever, cough, rash, dysuria, or syncope.  She states nothing seems to make her condition, better or worse, but she does state.  Movement and palpation makes her pain, worse at times. Past Medical History  Diagnosis Date  . Arthritis     Degenerative joint disease  . CAD (coronary artery disease), native coronary artery 12/27/2012    RCA stent in the setting of non-ST elevation MI in 1999   . Gastroesophageal reflux disease 12/27/2012  . Anxiety disorder 12/27/2012  . Chronic fatigue 12/27/2012  . High cholesterol   . Myocardial infarction 08/02/1997  . Anginal pain     "just now": (12/27/2012)  . Pneumonia 1950, 1951, 1952  . History of blood transfusion     w/tonsillectomy; w/spinal fusion  . H/O hiatal hernia   . Chronic lower back pain   . Chest pain, unspecified    Past Surgical History  Procedure Laterality Date  . Partial hysterectomy  1974  . Appendectomy  1965  . Tonsillectomy  1964  . Cholecystectomy  08/1989  . Back surgery    . Ganglion cyst excision Right 1990's  . Ligament repair Right 1990's    "radial collateral" (12/27/2012)  . Hernia repair  02/2002    "ventral" (12/27/2012)  . Lumbar laminectomy  1989; 1990's     "twice" (12/27/2012)  . Posterior lumbar fusion  04/2002  . Bilateral salpingoophorectomy Bilateral 1978    "2 separate ORs; months apart" (12/27/2012)  . Dilation and curettage of uterus  09/1967    "after miscarriage" (12/27/2012)  . Mass excision Left 1990's    "groin" (12/27/2012)  . Tympanoplasty Bilateral 1966; 2001    "left; right" (12/27/2012)  . Nasal septum surgery  1969  . Finger arthroplasty Right 12/232011    "ring finger; fused it; put pin in it; 2nd joint" (12/27/2012)  . 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 Coronary angioplasty with stent placement  08/03/1997; 12/28/2012    1 + 1" (01/19/2013)  . Cardiac catheterization  2000's    "couple times" (12/27/2012)   Family History  Problem Relation Age of Onset  . Coronary artery disease Mother   . Coronary artery disease Father    History  Substance Use Topics  . Smoking status: Former Smoker -- 1.00 packs/day for 17 years    Types: Cigarettes    Quit date: 12/27/1981  . Smokeless tobacco: Never Used  . Alcohol Use: No   OB History   Grav Para Term Preterm Abortions TAB SAB Ect Mult Living                 Review of Systems   All  other systems negative except as documented in the HPI. All pertinent positives and negatives as reviewed in the HPI. Allergies  Atorvastatin; Ciprofloxacin; Levaquin; and Dilaudid  Home Medications   Current Outpatient Rx  Name  Route  Sig  Dispense  Refill  . acidophilus (RISAQUAD) CAPS capsule   Oral   Take 3 capsules by mouth daily.          Marland Kitchen albuterol (PROAIR HFA) 108 (90 BASE) MCG/ACT inhaler   Inhalation   Inhale 2 puffs into the lungs every 6 (six) hours as needed for wheezing or shortness of breath.         Marland Kitchen aspirin 81 MG tablet   Oral   Take 81 mg by mouth daily.         Marland Kitchen b complex vitamins capsule   Oral   Take 1 capsule by mouth daily.         . bisoprolol (ZEBETA) 10 MG tablet   Oral    Take 10 mg by mouth daily.         . Budesonide-Formoterol Fumarate (SYMBICORT IN)   Inhalation   Inhale 1 puff into the lungs 2 (two) times daily as needed (shortness of breath).         . Cholecalciferol (VITAMIN D3 PO)   Oral   Take 1 tablet by mouth daily.         . clopidogrel (PLAVIX) 75 MG tablet   Oral   Take 75 mg by mouth at bedtime.         . Coenzyme Q10 (CO Q 10 PO)   Oral   Take 1 tablet by mouth daily.         Marland Kitchen estrogen-methylTESTOSTERone (ESTRATEST) 1.25-2.5 MG per tablet   Oral   Take 1 tablet by mouth at bedtime.         . fish oil-omega-3 fatty acids 1000 MG capsule   Oral   Take 1 g by mouth daily.         . fluticasone (FLONASE) 50 MCG/ACT nasal spray   Each Nare   Place 2 sprays into both nostrils daily as needed for allergies or rhinitis.         . Ginger, Zingiber officinalis, (GINGER PO)   Oral   Take 1 tablet by mouth daily.         Marland Kitchen HYDROcodone-acetaminophen (NORCO/VICODIN) 5-325 MG per tablet   Oral   Take 0.5-1 tablets by mouth every 6 (six) hours as needed for moderate pain.         . metaxalone (SKELAXIN) 800 MG tablet   Oral   Take 400-800 mg by mouth 2 (two) times daily as needed for muscle spasms.          . nitroGLYCERIN (NITROLINGUAL) 0.4 MG/SPRAY spray   Sublingual   Place 1 spray under the tongue every 5 (five) minutes as needed for chest pain.   12 g   12   . nortriptyline (PAMELOR) 50 MG capsule   Oral   Take 50 mg by mouth every evening.         . pantoprazole (PROTONIX) 40 MG tablet   Oral   Take 40 mg by mouth daily.          . rosuvastatin (CRESTOR) 20 MG tablet   Oral   Take 20 mg by mouth at bedtime.         . TURMERIC PO   Oral   Take 800 mg by mouth  2 (two) times daily.           BP 123/63  Pulse 70  Temp(Src) 97.7 F (36.5 C) (Oral)  Resp 17  SpO2 97% Physical Exam  Nursing note and vitals reviewed. Constitutional: She is oriented to person, place, and time. She  appears well-developed and well-nourished.  HENT:  Head: Normocephalic and atraumatic.  Mouth/Throat: Oropharynx is clear and moist.  Eyes: Pupils are equal, round, and reactive to light.  Neck: Normal range of motion. Neck supple.  Cardiovascular: Normal rate, regular rhythm and normal heart sounds.  Exam reveals no gallop and no friction rub.   No murmur heard. Pulmonary/Chest: Effort normal and breath sounds normal. She exhibits tenderness.  Abdominal: Soft. Bowel sounds are normal. She exhibits no distension. There is no tenderness. There is no rebound.  Neurological: She is alert and oriented to person, place, and time. She exhibits normal muscle tone. Coordination normal.  Skin: Skin is warm and dry. No rash noted. No erythema.    ED Course  Procedures (including critical care time) Labs Review Labs Reviewed  COMPREHENSIVE METABOLIC PANEL - Abnormal; Notable for the following:    Sodium 136 (*)    GFR calc non Af Amer 89 (*)    All other components within normal limits  CBC WITH DIFFERENTIAL  Randolm Idol, ED   Imaging Review Dg Chest 2 View  06/22/2013   CLINICAL DATA Left-sided chest pain.  Intermittent shortness of breath.  EXAM CHEST  2 VIEW  COMPARISON DG CHEST 2 VIEW dated 02/28/2013  FINDINGS The heart size and mediastinal contours are within normal limits. Both lungs are clear. The visualized skeletal structures are unremarkable.  IMPRESSION No active cardiopulmonary disease.  SIGNATURE  Electronically Signed   By: Dereck Ligas M.D.   On: 06/22/2013 19:57     EKG Interpretation   Date/Time:  Wednesday June 22 2013 15:15:50 EDT Ventricular Rate:  68 PR Interval:  196 QRS Duration: 98 QT Interval:  392 QTC Calculation: 416 R Axis:   70 Text Interpretation:  Normal sinus rhythm Incomplete right bundle branch  block Anterior infarct , age undetermined Abnormal ECG Similar to previous  Confirmed by ZAVITZ  MD, JOSHUA ([id number]) on 06/22/2013 3:30:58 PM      Patient will be discharged home with 2 sets of negative, troponins she's had atypical chest pain.  Patient, states, that she feels, like may be mostly related to her chronic chest pain.  Patient is to follow up with her cardiologist.  The patient also had a negative cath in 2014.  She is advised to return here for any worsening in her condition  Brent General, PA-C 06/22/13 2024

## 2013-06-22 NOTE — Telephone Encounter (Signed)
Spoke with pt and she is have pain under her left arm like she had prior to her stents in the past. Pt is agreeble to go to the ER and Dr. Irish Lack would like for her to.

## 2013-06-22 NOTE — ED Notes (Signed)
Pt states that she started havign CP this morning pt took her daily medication and 1/2 a vicoden at 11:30-12:00noon. Pt states that her pain did not decrease with the vicoden. Pt states the last time she had pain in her left arm and under her left breast she was cathed. Pt having similar pain now.

## 2013-06-22 NOTE — Telephone Encounter (Signed)
New Message:  Pt is calling to see if she can go to the ER to get her enzyme levels checked. Pt would like a call back.

## 2013-06-24 NOTE — ED Provider Notes (Signed)
Medical screening examination/treatment/procedure(s) were conducted as a shared visit with non-physician practitioner(s) and myself.  I personally evaluated the patient during the encounter.   EKG Interpretation   Date/Time:  Wednesday June 22 2013 15:15:50 EDT Ventricular Rate:  68 PR Interval:  196 QRS Duration: 98 QT Interval:  392 QTC Calculation: 416 R Axis:   70 Text Interpretation:  Normal sinus rhythm Incomplete right bundle branch  block Anterior infarct , age undetermined Abnormal ECG Similar to previous  Confirmed by ZAVITZ  MD, JOSHUA ([id number]) on 06/22/2013 3:30:58 PM      Briana Bowers is a 63 y.o. female hx of CAD s/p stent, MI here with chest pain. Has chronic chest pain, but worse this AM. Worse with movement so she massaged her muscles and felt better. Pain reproducible. She had cath in Oct last year that showed patent stent. Pain atypical for ACS. 6 hr delta tropnin neg. She has cardiology f/u. Stable for d/c.    Wandra Arthurs, MD 06/24/13 [phone]

## 2013-07-28 ENCOUNTER — Encounter: Payer: Self-pay | Admitting: *Deleted

## 2013-07-29 ENCOUNTER — Ambulatory Visit (INDEPENDENT_AMBULATORY_CARE_PROVIDER_SITE_OTHER): Payer: Medicare Other | Admitting: Neurology

## 2013-07-29 ENCOUNTER — Encounter: Payer: Self-pay | Admitting: Neurology

## 2013-07-29 VITALS — BP 105/60 | HR 98 | Temp 97.7°F | Ht 62.0 in | Wt 143.0 lb

## 2013-07-29 DIAGNOSIS — M79602 Pain in left arm: Secondary | ICD-10-CM

## 2013-07-29 DIAGNOSIS — M79609 Pain in unspecified limb: Secondary | ICD-10-CM

## 2013-07-29 DIAGNOSIS — R209 Unspecified disturbances of skin sensation: Secondary | ICD-10-CM

## 2013-07-29 MED ORDER — GABAPENTIN 100 MG PO CAPS: ORAL_CAPSULE | ORAL | Status: DC

## 2013-07-29 NOTE — Patient Instructions (Signed)
1.  EMG of left arm  2.  MRI cervical spine and thoracic spine without contrast 3.  Start taking neurontin 100mg  one tablet at bedtime for 3 days, then increase to 2 tablets at bedtime. 4.  Return to clinic in 6 weeks

## 2013-07-29 NOTE — Progress Notes (Signed)
[facility] Clinic Note - Initial Visit   Date: 07/29/2013    Briana Bowers MRN: [id number] DOB: [birth year]   Dear Dr Kenton Kingfisher:  Thank you for your kind referral of Briana Bowers for consultation of atypical chest pain. Although her history is well known to you, please allow [place] to reiterate it for the purpose of our medical record. The patient was accompanied to the clinic by sister who also provides collateral information.     History of Present Illness: Briana Bowers is a 63 y.o. right-handed Caucasian female with history of CAD s/p PCI stent (12/2012), hyperlipidemia, chronic back pain with disc herniation s/p lumbar fusion L4-S1 (2004), prediabetes, GERD, and anxiety presenting for evaluation of atypical chest and left arm pain.    During summer 2014, she developed left hand and elbow pain which progressively worsened.  She was told she had tennis elbow and did physical therapy little relief.  On September 15th, she developed acute onset of left under arm and shoulder pain and went to the emergency department.  She was evaluated by cardiology had a stent placed to LAD in September 2014 but had persistent chest discomfort despite intervention. She underwent repeat catheterization which showed patent stents. She continues to have left-sided squeezing in her chest and has not improved trial of nortriptyline.  Pain is localized "where I have the stents" and describes it as a "tight sensation like a blood pressure cuff".  Pain is described as hot burning, starting in her left breast and upper arm radiating into her left forearm.  Pain is improved if she is not using it.  It is not worse with activity.  She is taking nortrilptyline 50mg  without significant improvement.  She denies any associated weakness, numbness, or tingling.  She practiced neuromuscular studies and reports her pain was alleviated by "working on her anterior cervical muscles".    She had similar  symptoms after her first sent in 1999s which resolved with nortriptyline.  Out-side paper records, electronic medical record, and images have been reviewed where available and summarized as:  Labs 06/13/2013: LDL 80, triglyceride 64, HDL 44, total cholesterol 137, hemoglobin A1c 5.9  MRI lumbar spine 03/17/2012:  Satisfactory fusion L4-5 and L5-S1 without stenosis  Disc degeneration and spondylosis with mild spinal stenosis L2-3.  Mild to moderate spinal stenosis at L3-4.   Past Medical History  Diagnosis Date  . Arthritis     Degenerative joint disease  . CAD (coronary artery disease), native coronary artery 12/27/2012    RCA stent in the setting of non-ST elevation MI in 1999   . Gastroesophageal reflux disease 12/27/2012  . Anxiety disorder 12/27/2012  . Chronic fatigue 12/27/2012  . High cholesterol   . Myocardial infarction 08/02/1997  . Anginal pain     "just now": (12/27/2012)  . Pneumonia 1950, 1951, 1952  . History of blood transfusion     w/tonsillectomy; w/spinal fusion  . H/O hiatal hernia   . Chronic lower back pain   . Chest pain, unspecified   . Other abnormal glucose     Past Surgical History  Procedure Laterality Date  . Partial hysterectomy  1974  . Appendectomy  1965  . Tonsillectomy  1964  . Cholecystectomy  08/1989  . Back surgery    . Ganglion cyst excision Right 1990's  . Ligament repair Right 1990's    "radial collateral" (12/27/2012)  . Hernia repair  02/2002    "ventral" (12/27/2012)  . Lumbar laminectomy  1989; 1990's    "twice" (12/27/2012)  . Posterior lumbar fusion  04/2002  . Bilateral salpingoophorectomy Bilateral 1978    "2 separate ORs; months apart" (12/27/2012)  . Dilation and curettage of uterus  09/1967    "after miscarriage" (12/27/2012)  . Mass excision Left 1990's    "groin" (12/27/2012)  . Tympanoplasty Bilateral 1966; 2001    "left; right" (12/27/2012)  . Nasal septum surgery  1969  . Finger arthroplasty Right 12/232011    "ring  finger; fused it; put pin in it; 2nd joint" (12/27/2012)  . 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 Coronary angioplasty with stent placement  08/03/1997; 12/28/2012    1 + 1" (01/19/2013)  . Cardiac catheterization  2000's    "couple times" (12/27/2012)     Medications:  Current Outpatient Prescriptions on File Prior to Visit  Medication Sig Dispense Refill  . acidophilus (RISAQUAD) CAPS capsule Take 3 capsules by mouth daily.       Marland Kitchen albuterol (PROAIR HFA) 108 (90 BASE) MCG/ACT inhaler Inhale 2 puffs into the lungs every 6 (six) hours as needed for wheezing or shortness of breath.      Marland Kitchen aspirin 81 MG tablet Take 81 mg by mouth daily.      Marland Kitchen b complex vitamins capsule Take 1 capsule by mouth daily.      . bisoprolol (ZEBETA) 10 MG tablet Take 10 mg by mouth daily.      . Budesonide-Formoterol Fumarate (SYMBICORT IN) Inhale 1 puff into the lungs 2 (two) times daily as needed (shortness of breath).      . Cholecalciferol (VITAMIN D3 PO) Take 1 tablet by mouth daily.      . clopidogrel (PLAVIX) 75 MG tablet Take 75 mg by mouth at bedtime.      . Coenzyme Q10 (CO Q 10 PO) Take 1 tablet by mouth daily.      Marland Kitchen estrogen-methylTESTOSTERone (ESTRATEST) 1.25-2.5 MG per tablet Take 1 tablet by mouth at bedtime.      . fish oil-omega-3 fatty acids 1000 MG capsule Take 1 g by mouth daily.      . fluticasone (FLONASE) 50 MCG/ACT nasal spray Place 2 sprays into both nostrils daily as needed for allergies or rhinitis.      . Ginger, Zingiber officinalis, (GINGER PO) Take 1 tablet by mouth daily.      Marland Kitchen HYDROcodone-acetaminophen (NORCO/VICODIN) 5-325 MG per tablet Take 0.5-1 tablets by mouth every 6 (six) hours as needed for moderate pain.      Marland Kitchen HYDROcodone-acetaminophen (NORCO/VICODIN) 5-325 MG per tablet Take 1 tablet by mouth every 6 (six) hours as needed for moderate pain.  15 tablet  0  . metaxalone (SKELAXIN) 800 MG tablet Take 400-800 mg  by mouth 2 (two) times daily as needed for muscle spasms.       . nitroGLYCERIN (NITROLINGUAL) 0.4 MG/SPRAY spray Place 1 spray under the tongue every 5 (five) minutes as needed for chest pain.  12 g  12  . nortriptyline (PAMELOR) 50 MG capsule Take 50 mg by mouth every evening.      . pantoprazole (PROTONIX) 40 MG tablet Take 40 mg by mouth daily.       . rosuvastatin (CRESTOR) 20 MG tablet Take 20 mg by mouth at bedtime.      . TURMERIC PO Take 800 mg by mouth 2 (two) times daily.  No current facility-administered medications on file prior to visit.    Allergies:  Allergies  Allergen Reactions  . Atorvastatin     Muscle soreness  . Ciprofloxacin Other (See Comments)    "makes me feel very weak"  . Levaquin [Levofloxacin In D5w] Other (See Comments)    "makes me feel very weak"  . Dilaudid [Hydromorphone Hcl] Other (See Comments)    "makes me really sleepy"    Family History: Family History  Problem Relation Age of Onset  . Coronary artery disease Mother   . Coronary artery disease Father     Social History: History   Social History  . Marital Status: Married    Spouse Name: N/A    Number of Children: N/A  . Years of Education: N/A   Occupational History  . Not on file.   Social History Main Topics  . Smoking status: Former Smoker -- 1.00 packs/day for 17 years    Types: Cigarettes    Quit date: 12/27/1981  . Smokeless tobacco: Never Used  . Alcohol Use: No  . Drug Use: No  . Sexual Activity: Yes   Other Topics Concern  . Not on file   Social History Narrative  . No narrative on file    Review of Systems:  CONSTITUTIONAL: No fevers, chills, night sweats, or weight loss.   EYES: No visual changes or eye pain ENT: No hearing changes.  No history of nose bleeds.   RESPIRATORY: No cough, wheezing and shortness of breath.   CARDIOVASCULAR: + for chest pain, no palpitations.   GI: Negative for abdominal discomfort, blood in stools or black stools.  No  recent change in bowel habits.   GU:  No history of incontinence.   MUSCLOSKELETAL: +history of joint pain or swelling.  No myalgias.   SKIN: Negative for lesions, rash, and itching.   HEMATOLOGY/ONCOLOGY: Negative for prolonged bleeding, bruising easily, and swollen nodes.  No history of cancer.   ENDOCRINE: Negative for cold or heat intolerance, polydipsia or goiter.   PSYCH:  No depression or anxiety symptoms.   NEURO: As Above.   Vital Signs:  BP 105/60  Pulse 98  Temp(Src) 97.7 F (36.5 C) (Oral)  Ht 5\' 2"  (1.575 m)  Wt 143 lb (64.864 kg)  BMI 26.15 kg/m2  SpO2 94%   General Medical Exam:   General:  Well appearing, comfortable.   Eyes/ENT: see cranial nerve examination.   Neck: No masses appreciated.  Full range of motion without tenderness.  No carotid bruits. Respiratory:  Clear to auscultation, good air entry bilaterally.   Cardiac:  Mild systolic murmur, regular rate and rhythm Back:  No pain to palpation of spinous processes.   Extremities:  No deformities, edema, or skin discoloration. Good capillary refill.   Skin:  Skin color, texture, turgor normal. No rashes or lesions.  Neurological Exam: MENTAL STATUS including orientation to time, place, person, recent and remote memory, attention span and concentration, language, and fund of knowledge is normal.  Speech is not dysarthric.  CRANIAL NERVES: II:  No visual field defects.  Unremarkable fundi.   III-IV-VI: Pupils equal round and reactive to light.  Normal conjugate, extra-ocular eye movements in all directions of gaze.  No nystagmus.  No ptosis.   V:  Normal facial sensation.     VII:  Normal facial symmetry and movements.  VIII:  Normal hearing and vestibular function.   IX-X:  Normal palatal movement.   XI:  Normal shoulder shrug and head  rotation.   XII:  Normal tongue strength and range of motion, no deviation or fasciculation.  MOTOR:  No atrophy, fasciculations or abnormal movements.  No pronator drift.   Tone is normal.  Left shoulder range of motion is full and there is no muscle tenderness to palpation.  Right Upper Extremity:    Left Upper Extremity:    Deltoid  5/5   Deltoid  5/5   Biceps  5/5   Biceps  5/5   Triceps  5/5   Triceps  5/5   Wrist extensors  5/5   Wrist extensors  5/5   Wrist flexors  5/5   Wrist flexors  5/5   Finger extensors  5/5   Finger extensors  5/5   Finger flexors  5/5   Finger flexors  5/5   Dorsal interossei  5/5   Dorsal interossei  5/5   Abductor pollicis  5/5   Abductor pollicis  5/5   Tone (Ashworth scale)  0  Tone (Ashworth scale)  0   Right Lower Extremity:    Left Lower Extremity:    Hip flexors  5/5   Hip flexors  5/5   Hip extensors  5/5   Hip extensors  5/5   Knee flexors  5/5   Knee flexors  5/5   Knee extensors  5/5   Knee extensors  5/5   Dorsiflexors  5/5   Dorsiflexors  5/5   Plantarflexors  5/5   Plantarflexors  5/5   Toe extensors  5/5   Toe extensors  5/5   Toe flexors  5/5   Toe flexors  5/5   Tone (Ashworth scale)  0  Tone (Ashworth scale)  0   MSRs:  Right                                                                 Left brachioradialis 2+  brachioradialis 2+  biceps 2+  biceps 2+  triceps 2+  triceps 2+  patellar 2+  patellar 2+  ankle jerk 1+  ankle jerk 1+  Hoffman no  Hoffman no  plantar response down  plantar response down   SENSORY:  Patchy sensory loss over the left C8-T3 dermatome to pinprick only otherwise, normal and symmetric perception of light touch, pinprick, vibration, and proprioception.  Romberg's sign absent.   COORDINATION/GAIT: Normal finger-to- nose-finger and heel-to-shin.  Intact rapid alternating movements bilaterally.  Able to rise from a chair without using arms.  Gait narrow based and stable. Tandem and stressed gait intact.    IMPRESSION: Ms. Beechler is a 63 year old female presenting for evaluation of atypical chest pain and left arm pain. Neurological examination shows a patchy area of sensory  loss over the left anterior and posterior thoracic region with slight inconsistencies involving the left upper arm. Her motor strength and reflexes are normal. She may very well have neuropathic pain, however the etiology is unclear to me. There is no evidence of rash does suggest herpetic neuralgia.   I would like to look at her lower cervical and upper thoracic spine to look for radiculopathy and/or structural abnormalities, as this can present with band like sensation. Further, I will obtain an EMG to look for peripheral nerve injury. For symptomatic control, I have offered  her a trial of low-dose gabapentin 100mg  (she reports being very sensitive to medications) which can be titrated as needed.   PLAN/RECOMMENDATIONS:  1.  EMG of left arm  2.  MRI cervical spine and thoracic spine without contrast 3.  Start taking neurontin 100mg  one tablet at bedtime for 3 days, then increase to 2 tablets at bedtime. 4.  Return to clinic in 6 weeks   The duration of this appointment visit was 50 minutes of face-to-face time with the patient.  Greater than 50% of this time was spent in counseling, explanation of diagnosis, planning of further management, and coordination of care.   Thank you for allowing me to participate in patient's care.  If I can answer any additional questions, I would be pleased to do so.    Sincerely,    Donika K. [name], DO

## 2013-08-01 NOTE — Progress Notes (Signed)
Note faxed.

## 2013-08-11 ENCOUNTER — Ambulatory Visit (HOSPITAL_COMMUNITY)
Admission: RE | Admit: 2013-08-11 | Discharge: 2013-08-11 | Disposition: A | Discharge status: Home / Self Care | Payer: Medicare Other | Source: Ambulatory Visit | Attending: Neurology | Admitting: Neurology

## 2013-08-11 ENCOUNTER — Ambulatory Visit (HOSPITAL_COMMUNITY): Payer: Medicare Other

## 2013-08-11 DIAGNOSIS — R209 Unspecified disturbances of skin sensation: Secondary | ICD-10-CM

## 2013-08-11 DIAGNOSIS — M79602 Pain in left arm: Secondary | ICD-10-CM

## 2013-08-11 DIAGNOSIS — M79609 Pain in unspecified limb: Secondary | ICD-10-CM | POA: Insufficient documentation

## 2013-08-12 ENCOUNTER — Telehealth: Payer: Self-pay | Admitting: Neurology

## 2013-08-12 MED ORDER — GABAPENTIN 100 MG PO CAPS: ORAL_CAPSULE | ORAL | Status: DC

## 2013-08-12 NOTE — Telephone Encounter (Signed)
Please advise 

## 2013-08-12 NOTE — Telephone Encounter (Signed)
Pt currently on 200mg  Gabapentin. Says pain is getting worse again. Should she increase her dose? Please call [phone] / Sherri S.

## 2013-08-12 NOTE — Telephone Encounter (Signed)
Yes, increase to neurontin 300mg  at bedtime.  If still does not help, can go up to 100mg  in the morning and 300mg  at bedtime.  We can increase further as needed.  Azlee Monforte K. [name], DO

## 2013-08-12 NOTE — Telephone Encounter (Signed)
Patient given instructions and agreed with plan.  She requested for another Rx to be sent in.  How many refills can I give.

## 2013-08-12 NOTE — Addendum Note (Signed)
Addended by: Alda Berthold on: 08/12/2013 05:20 PM   Modules accepted: Orders

## 2013-08-12 NOTE — Telephone Encounter (Signed)
Rx sent.  Yasmin Bronaugh K. [name], DO

## 2013-08-15 ENCOUNTER — Telehealth: Payer: Self-pay | Admitting: Neurology

## 2013-08-15 NOTE — Telephone Encounter (Signed)
Called and discussed results of MRI cervical and thoracic spine which does show nerve impingement at C5-6 and T1-2 .  Recommended that she increase neurontin to 200mg  in the morning and 300mg  at bedtime.  All questions were answered.  EMG is pending.  Eutimio Gharibian K. [name], DO

## 2013-08-23 ENCOUNTER — Telehealth: Payer: Self-pay | Admitting: Neurology

## 2013-08-23 ENCOUNTER — Telehealth: Payer: Self-pay | Admitting: *Deleted

## 2013-08-23 NOTE — Telephone Encounter (Signed)
Patient has noticed head bobbing and head turning to the left since she has been on gabapentin 3 pills a day.  She is in the process of weaning off the medication.  She has gone down to 2 a day for the next 3 days then down to 1 a day for 3 days.  She wants to know if she can take the rest of the taper for 2 day each instead.  [phone]

## 2013-08-23 NOTE — Telephone Encounter (Signed)
That should be fine.    Marcell Pfeifer K. [name], DO

## 2013-08-23 NOTE — Telephone Encounter (Signed)
Patient notified

## 2013-10-03 ENCOUNTER — Ambulatory Visit (INDEPENDENT_AMBULATORY_CARE_PROVIDER_SITE_OTHER): Payer: Medicare Other | Admitting: Neurology

## 2013-10-03 DIAGNOSIS — M79602 Pain in left arm: Secondary | ICD-10-CM

## 2013-10-03 DIAGNOSIS — R209 Unspecified disturbances of skin sensation: Secondary | ICD-10-CM

## 2013-10-03 DIAGNOSIS — M79609 Pain in unspecified limb: Secondary | ICD-10-CM

## 2013-10-03 NOTE — Procedures (Signed)
[facility]  [place], [address]  [place], [place] [address] Tel: [phone] Fax:  [phone] Test Date:  10/03/2013  Patient: Briana Bowers DOB: 1951/01/12 Physician: Narda Amber, DO  Sex: Female Height: 5\' 2"  Ref Phys: Narda Amber  ID#: [id number] Temp: 35.0C Technician: Laureen Ochs R. NCS T.   Patient Complaints: Patient is a 63 year old female with left arm pain and paresthesias of the hands.  NCV & EMG Findings: Extensive electrodiagnostic testing of the left upper extremity and additional studies of the mid-thoracic paraspinal muscle (T6 level) shows: 1. Normal median, ulnar, and radial sensory responses. 2. Normal median and ulnar motor responses. 3. Chronic motor axon loss changes are seen affecting C7 myotomes, without accompanied active denervation. 4. No active motor axon loss changes affecting the mid-thoracic paraspinal muscle at T6 level.   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 Overall, these findings are mild in degree electrically.   ___________________________ Narda Amber, DO    Nerve Conduction Studies Anti Sensory Summary Table   Site NR Peak (ms) Norm Peak (ms) P-T Amp (V) Norm P-T Amp  Left Median Anti Sensory (2nd Digit)  Wrist    2.9 <3.8 44.0 >10  Left Radial Anti Sensory (Base 1st Digit)  Wrist    2.2 <2.8 18.6 >10  Left Ulnar Anti Sensory (5th Digit)  Wrist    2.3 <3.2 21.0 >5   Motor Summary Table   Site NR Onset (ms) Norm Onset (ms) O-P Amp (mV) Norm O-P Amp Site1 Site2 Delta-0 (ms) Dist (cm) Vel (m/s) Norm Vel (m/s)  Left Median Motor (Abd Poll Brev)  Wrist    3.0 <4.0 7.1 >5 Elbow Wrist 3.7 21.0 57 >50  Elbow    6.7  7.1         Left Ulnar Motor (Abd Dig Minimi)  Wrist    2.0 <3.1 7.5 >7 B Elbow Wrist 3.2 19.0 59 >50  B Elbow    5.2  7.0  A Elbow B Elbow 1.9 10.0 53 >50  A Elbow    7.1  6.3          F Wave Studies   NR F-Lat (ms)  Lat Norm (ms) L-R F-Lat (ms)  Left Ulnar (Mrkrs) (Abd Dig Min)     28.59 <33    EMG   Side Muscle Ins Act Fibs Psw Fasc Number Recrt Dur Dur. Amp Amp. Poly Poly. Comment  Left 1stDorInt Nml Nml Nml Nml Nml Nml Nml Nml Nml Nml Nml Nml N/A  Left Ext Indicis Nml Nml Nml Nml Nml Nml Nml Nml Nml Nml Nml Nml N/A  Left FlexPolLong Nml Nml Nml Nml Nml Nml Nml Nml Nml Nml Nml Nml N/A  Left PronatorTeres Nml Nml Nml Nml 1- Mod-R Few 1+ Nml Nml Nml Nml N/A  Left Biceps Nml Nml Nml Nml Nml Nml Nml Nml Nml Nml Nml Nml N/A  Left Triceps Nml Nml Nml Nml 1- Mod-R Some 1+ Nml Nml Nml Nml N/A  Left Deltoid Nml Nml Nml Nml Nml Nml Nml Nml Nml Nml Nml Nml N/A  Left T6 Parasp Nml Nml Nml Nml Nml Nml Nml Nml Nml Nml Nml Nml N/A      Waveforms:

## 2013-10-05 ENCOUNTER — Telehealth: Payer: Self-pay | Admitting: Neurology

## 2013-10-05 NOTE — Telephone Encounter (Signed)
Called patient and discussed results of EMG and MRI cervical and thoracic spine.  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 There is moderate spinal stenosis at C5-6 with multilevel moderate to severe bilateral neural foraminal stenosis and moderate foraminal stenosis at T1-T2.  There is no canal or foraminal stenosis at lower thoracic levels.  EMG shows old C7 radiulopathy affecting the left side.    Based on work-up thus far, there is nothing that clearly points to nerve injury as a source of her atypical chest pain.  Cervical radiculopathy may cause some of her arm symptoms, but would not radiate into her chest.  No thoracic radiculopathy on imaging or EMG.  I offered alternative therapies for pain (TCAs), since she stopped neurontin due to cognitive side effects, but patient reluctant to start other medications at this time.  She has requested to cancel follow-up visit and will contact my office with any new issues.    Donika K. [name], DO

## 2013-10-07 ENCOUNTER — Ambulatory Visit: Payer: Medicare Other | Admitting: Neurology

## 2013-12-27 ENCOUNTER — Other Ambulatory Visit: Payer: Self-pay | Admitting: Interventional Cardiology

## 2013-12-31 ENCOUNTER — Other Ambulatory Visit: Payer: Self-pay | Admitting: Interventional Cardiology

## 2014-01-03 ENCOUNTER — Encounter: Payer: Self-pay | Admitting: Interventional Cardiology

## 2014-01-03 ENCOUNTER — Ambulatory Visit (INDEPENDENT_AMBULATORY_CARE_PROVIDER_SITE_OTHER): Payer: Medicare Other | Admitting: Interventional Cardiology

## 2014-01-03 VITALS — BP 120/72 | HR 72 | Ht 62.5 in | Wt 145.6 lb

## 2014-01-03 DIAGNOSIS — K219 Gastro-esophageal reflux disease without esophagitis: Secondary | ICD-10-CM

## 2014-01-03 DIAGNOSIS — I2510 Atherosclerotic heart disease of native coronary artery without angina pectoris: Secondary | ICD-10-CM

## 2014-01-03 DIAGNOSIS — E785 Hyperlipidemia, unspecified: Secondary | ICD-10-CM

## 2014-01-03 MED ORDER — ASPIRIN EC 325 MG PO TBEC: 325.0000 mg | DELAYED_RELEASE_TABLET | Freq: Every day | ORAL | Status: DC

## 2014-01-03 NOTE — Progress Notes (Signed)
Patient ID: Briana Bowers, female   DOB: [birth year], 63 y.o.   MRN: [id number] Patient ID: Briana Bowers, female   DOB: [birth year], 63 y.o.   MRN: [id number]    Briana Bowers, Briana Bowers, Briana Bowers  [id number] Phone: [phone] Fax:  [phone]  Date:  01/03/2014   ID:  Briana Bowers, Briana Bowers 1950-12-02, MRN [id number]  PCP:  Briana Frees, MD      History of Present Illness: Briana Bowers is a 63 y.o. female  who has coronary artery disease. She had a LAD stent placed in 2014. She has an RCA stent for many years ago. She had persistent chest discomfort after this most recent stent, prompting repeat catheterization. Her stents were found to be patent.   She feels the Plavix affects her stomach bd she wants to come off of this medicine.    She has stopped Crestor and tried a natural cholesterol medicine- TC increased to 250.  Se does some exercise and has no problems.  Trying to increase stamina.    Wt Readings from Last 3 Encounters:  01/03/14 145 lb 9.6 oz (66.044 kg)  07/29/13 143 lb (64.864 kg)  03/22/13 152 lb (68.947 kg)     Past Medical History  Diagnosis Date  . Arthritis     Degenerative joint disease  . CAD (coronary artery disease), native coronary artery 12/27/2012    RCA stent in the setting of non-ST elevation MI in 1999   . Gastroesophageal reflux disease 12/27/2012  . Anxiety disorder 12/27/2012  . Chronic fatigue 12/27/2012  . High cholesterol   . Myocardial infarction 08/02/1997  . Anginal pain     "just now": (12/27/2012)  . Pneumonia 1950, 1951, 1952  . History of blood transfusion     w/tonsillectomy; w/spinal fusion  . H/O hiatal hernia   . Chronic lower back pain   . Chest pain, unspecified   . Other abnormal glucose     Current Outpatient Prescriptions  Medication Sig Dispense Refill  . acidophilus (RISAQUAD) CAPS capsule Take 3 capsules by mouth daily.       Marland Kitchen albuterol (PROAIR HFA) 108 (90 BASE) MCG/ACT inhaler Inhale 2 puffs into the lungs  every 6 (six) hours as needed for wheezing or shortness of breath.      Marland Kitchen aspirin 81 MG tablet Take 81 mg by mouth daily.      Marland Kitchen b complex vitamins capsule Take 1 capsule by mouth daily.      . bisoprolol (ZEBETA) 10 MG tablet Take 10 mg by mouth daily.      . Budesonide-Formoterol Fumarate (SYMBICORT IN) Inhale 1 puff into the lungs 2 (two) times daily as needed (shortness of breath).      . Cholecalciferol (VITAMIN D3 PO) Take 1 tablet by mouth daily.      . clopidogrel (PLAVIX) 75 MG tablet TAKE 1 TABLET BY MOUTH OCNE DAILY  30 tablet  1  . Coenzyme Q10 (CO Q 10 PO) Take 1 tablet by mouth daily.      Marland Kitchen estrogen-methylTESTOSTERone (ESTRATEST) 1.25-2.5 MG per tablet Take 1 tablet by mouth at bedtime.      . fish oil-omega-3 fatty acids 1000 MG capsule Take 1 g by mouth daily.      . fluticasone (FLONASE) 50 MCG/ACT nasal spray Place 2 sprays into both nostrils daily as needed for allergies or rhinitis.      . Ginger, Zingiber officinalis, (GINGER PO) Take 1 tablet by mouth  daily.      Marland Kitchen HYDROcodone-acetaminophen (NORCO/VICODIN) 5-325 MG per tablet Take 0.5-1 tablets by mouth every 6 (six) hours as needed for moderate pain.      Marland Kitchen HYDROcodone-acetaminophen (NORCO/VICODIN) 5-325 MG per tablet Take 1 tablet by mouth every 6 (six) hours as needed for moderate pain.  15 tablet  0  . metaxalone (SKELAXIN) 800 MG tablet Take 400-800 mg by mouth 2 (two) times daily as needed for muscle spasms.       . nitroGLYCERIN (NITROLINGUAL) 0.4 MG/SPRAY spray Place 1 spray under the tongue every 5 (five) minutes as needed for chest pain.  12 g  12  . pantoprazole (PROTONIX) 40 MG tablet Take 40 mg by mouth daily.       . rosuvastatin (CRESTOR) 20 MG tablet Take 20 mg by mouth at bedtime.      . TURMERIC PO Take 800 mg by mouth 2 (two) times daily.       Marland Kitchen gabapentin (NEURONTIN) 100 MG capsule Take 3 tablets at bedtime  90 capsule  3  . nortriptyline (PAMELOR) 50 MG capsule Take 50 mg by mouth every evening.        No current facility-administered medications for this visit.    Allergies:    Allergies  Allergen Reactions  . Atorvastatin     Muscle soreness  . Ciprofloxacin Other (See Comments)    "makes me feel very weak"  . Levaquin [Levofloxacin In D5w] Other (See Comments)    "makes me feel very weak"  . Dilaudid [Hydromorphone Hcl] Other (See Comments)    "makes me really sleepy"    Social History:  The patient  reports that she quit smoking about 32 years ago. Her smoking use included Cigarettes. She has a 17 pack-year smoking history. She has never used smokeless tobacco. She reports that she does not drink alcohol or use illicit drugs.   Family History:  The patient's family history includes Coronary artery disease in her father and mother; Other in her daughter and son.   ROS:  Please see the history of present illness.  No nausea, vomiting.  No fevers, chills.  No focal weakness.  No dysuria. Occasional palpitations-treated with bisoprolol.   All other systems reviewed and negative.   PHYSICAL EXAM: VS:  BP 120/72  Pulse 72  Ht 5' 2.5" (1.588 m)  Wt 145 lb 9.6 oz (66.044 kg)  BMI 26.19 kg/m2 Well nourished, well developed, in no acute distress HEENT: normal Neck: no JVD, no carotid bruits Cardiac:  normal S1, S2; RRR;  Lungs:  clear to auscultation bilaterally, no wheezing, rhonchi or rales Abd: soft, nontender, no hepatomegaly Ext: no edema,; 2+ right DP pulse Skin: warm and dry Neuro:   no focal abnormalities noted  EKG:   Normal sinus rhythm, no significant ST T-wave changes   ASSESSMENT AND PLAN:  1. CAD: Cardiac cath in October 2014showed widely patent stents in the RCA and LAD. Normal LV function normal hemodynamics as well.  She wants to stop Plavix.  She wants to go back to ASA 325 mg daily.    2.  GERD: Symptoms are better since coming off of Brilinta.  They're not completely gone. Continue Protonix.  Hopefully will resolve off of plavix.  3. Hyperlipidemia:  Last LDL 81 in October 2014. Well controlled. Continue current lipid-lowering therapy.  She is willing to stay on the statin.   Signed, Mina Marble, MD, [facility] 01/03/2014 2:35 PM

## 2014-01-03 NOTE — Patient Instructions (Signed)
Your physician has recommended you make the following change in your medication:   1. Stop Aspirin 81 mg.   2. Stop Plavix 75 mg.   3. Start Aspirin 325 mg 1 tablet daily.   Your physician wants you to follow-up in: 1 year with Dr. Irish Lack. You will receive a reminder letter in the mail two months in advance. If you don't receive a letter, please call our office to schedule the follow-up appointment.

## 2014-03-23 ENCOUNTER — Encounter (HOSPITAL_COMMUNITY): Payer: Self-pay | Admitting: Interventional Cardiology

## 2014-03-29 ENCOUNTER — Telehealth: Payer: Self-pay | Admitting: Interventional Cardiology

## 2014-03-29 NOTE — Telephone Encounter (Signed)
New msg    Pt states she isn't feeling well, her BP is 95/44.   Pt states her arms her and her fingers feel as if they may "pop off."   Left arm hurts more than right arm.  Please call [phone] or cell [phone]

## 2014-03-29 NOTE — Telephone Encounter (Signed)
Agree with plan 

## 2014-03-29 NOTE — Telephone Encounter (Signed)
I spoke with the patient. She reports that she has been having low DBP readings in the 50's for the last 2 months. She will occasionally have some fast HR's. She takes bisoprolol only PRN. She will always check her BP prior to taking this for fast HR's, but she does not often use it. She has been having some generalized pain recently. She reports feeling like her fingers hurt so bad that they will explode. She was at the [facility] walk-in clinic last night and diagnosed with the flu. Her temp last night was 103.5. She reports that she drinks quite a bit on a daily basis (about 5 quarts). I have advised her that I am not sure why her BP have been running low over the last couple of months. Last assessment of her EF showed a normal heart function. I have encouraged her to maintain her fluid and salt intake to support her BP, especially with the flu. I advised I will forward to Dr. Irish Lack for any further review and recommendations. I will call her back with any changes. She is agreeable.

## 2014-03-30 ENCOUNTER — Encounter (HOSPITAL_COMMUNITY): Payer: Self-pay | Admitting: Emergency Medicine

## 2014-03-30 ENCOUNTER — Emergency Department (HOSPITAL_COMMUNITY)
Admission: EM | Admit: 2014-03-30 | Discharge: 2014-03-31 | Disposition: A | Discharge status: Home / Self Care | Payer: Medicare Other | Attending: Emergency Medicine | Admitting: Emergency Medicine

## 2014-03-30 ENCOUNTER — Emergency Department (HOSPITAL_COMMUNITY): Payer: Medicare Other

## 2014-03-30 DIAGNOSIS — Y9389 Activity, other specified: Secondary | ICD-10-CM | POA: Diagnosis not present

## 2014-03-30 DIAGNOSIS — R600 Localized edema: Secondary | ICD-10-CM

## 2014-03-30 DIAGNOSIS — Y990 Civilian activity done for income or pay: Secondary | ICD-10-CM | POA: Diagnosis not present

## 2014-03-30 DIAGNOSIS — I252 Old myocardial infarction: Secondary | ICD-10-CM | POA: Insufficient documentation

## 2014-03-30 DIAGNOSIS — K219 Gastro-esophageal reflux disease without esophagitis: Secondary | ICD-10-CM | POA: Insufficient documentation

## 2014-03-30 DIAGNOSIS — Y9289 Other specified places as the place of occurrence of the external cause: Secondary | ICD-10-CM | POA: Diagnosis not present

## 2014-03-30 DIAGNOSIS — Z8659 Personal history of other mental and behavioral disorders: Secondary | ICD-10-CM | POA: Diagnosis not present

## 2014-03-30 DIAGNOSIS — S6992XA Unspecified injury of left wrist, hand and finger(s), initial encounter: Secondary | ICD-10-CM | POA: Diagnosis not present

## 2014-03-30 DIAGNOSIS — S8992XA Unspecified injury of left lower leg, initial encounter: Secondary | ICD-10-CM | POA: Diagnosis present

## 2014-03-30 DIAGNOSIS — I2510 Atherosclerotic heart disease of native coronary artery without angina pectoris: Secondary | ICD-10-CM | POA: Insufficient documentation

## 2014-03-30 DIAGNOSIS — Z9861 Coronary angioplasty status: Secondary | ICD-10-CM | POA: Insufficient documentation

## 2014-03-30 DIAGNOSIS — Z87891 Personal history of nicotine dependence: Secondary | ICD-10-CM | POA: Insufficient documentation

## 2014-03-30 DIAGNOSIS — M1990 Unspecified osteoarthritis, unspecified site: Secondary | ICD-10-CM | POA: Insufficient documentation

## 2014-03-30 DIAGNOSIS — M79671 Pain in right foot: Secondary | ICD-10-CM

## 2014-03-30 DIAGNOSIS — Z7982 Long term (current) use of aspirin: Secondary | ICD-10-CM | POA: Insufficient documentation

## 2014-03-30 DIAGNOSIS — Z8701 Personal history of pneumonia (recurrent): Secondary | ICD-10-CM | POA: Insufficient documentation

## 2014-03-30 DIAGNOSIS — G8929 Other chronic pain: Secondary | ICD-10-CM | POA: Insufficient documentation

## 2014-03-30 DIAGNOSIS — E780 Pure hypercholesterolemia: Secondary | ICD-10-CM | POA: Diagnosis not present

## 2014-03-30 DIAGNOSIS — X58XXXA Exposure to other specified factors, initial encounter: Secondary | ICD-10-CM | POA: Diagnosis not present

## 2014-03-30 DIAGNOSIS — Z9889 Other specified postprocedural states: Secondary | ICD-10-CM | POA: Diagnosis not present

## 2014-03-30 DIAGNOSIS — Z79899 Other long term (current) drug therapy: Secondary | ICD-10-CM | POA: Diagnosis not present

## 2014-03-30 MED ORDER — ALBUTEROL SULFATE (2.5 MG/3ML) 0.083% IN NEBU
2.5000 mg | INHALATION_SOLUTION | Freq: Once | RESPIRATORY_TRACT | Status: AC
  Administered 2014-03-30: 2.5 mg via RESPIRATORY_TRACT
  Filled 2014-03-30: qty 3

## 2014-03-30 MED ORDER — ENOXAPARIN SODIUM 80 MG/0.8ML ~~LOC~~ SOLN
65.0000 mg | Freq: Once | SUBCUTANEOUS | Status: AC
  Administered 2014-03-30: 65 mg via SUBCUTANEOUS
  Filled 2014-03-30: qty 0.8

## 2014-03-30 NOTE — ED Notes (Signed)
PA at the bedside.

## 2014-03-30 NOTE — ED Notes (Signed)
Patient here with left knee and left wrist pain. States that she has been moving items around at work specifically bags of mulch. On the 10th and 11th of this month she turned awkwardly on left knee and left wrist while moving items. Since that time left wrist and left ankle have swollen slightly and right foot has been sore. History of arthritis. Patient known to have flu and has no new complaints in that area.

## 2014-03-30 NOTE — ED Provider Notes (Signed)
CSN: [id number]     Arrival date & time 03/30/14  2017 History  This chart was scribed for Margarita Mail, PA-C working with Orpah Greek, * by Randa Evens, ED Scribe. This patient was seen in room TR11C/TR11C and the patient's care was started at 9:45 PM.      Chief Complaint  Patient presents with  . Leg Pain  . Wrist Pain   Patient is a 63 y.o. female presenting with leg pain and wrist pain. The history is provided by the patient. No language interpreter was used.  Leg Pain Associated symptoms: no fever   Wrist Pain   HPI Comments: Briana Bowers is a 63 y.o. female who presents to the Emergency Department complaining of left knee and left wrist pain onset 1 week prior. Pt states she is also having some right foot pain. Pt states she suffered a twist injury to he knee. Pt states she has associated knee swelling. Pt states that the knee clicks and sometimes intermittently feels unstable. Pt states that she was lifting heavy mulch when she injured the wrist. Pt states that it is painful to move the wrist. Pt states she has a hx of arthritis in her wrist and hand. She is also complaining of left foot pain and left ankle swelling. Pt states that she did make a long drive 3 days ago. Pt states she has recently had flu symptoms and was not able to the difference between her symptoms. Pt denies gait problem    Past Medical History  Diagnosis Date  . Arthritis     Degenerative joint disease  . CAD (coronary artery disease), native coronary artery 12/27/2012    RCA stent in the setting of non-ST elevation MI in 1999   . Gastroesophageal reflux disease 12/27/2012  . Anxiety disorder 12/27/2012  . Chronic fatigue 12/27/2012  . High cholesterol   . Myocardial infarction 08/02/1997  . Anginal pain     "just now": (12/27/2012)  . Pneumonia 1950, 1951, 1952  . History of blood transfusion     w/tonsillectomy; w/spinal fusion  . H/O hiatal hernia   . Chronic lower back pain   . Chest  pain, unspecified   . Other abnormal glucose    Past Surgical History  Procedure Laterality Date  . Partial hysterectomy  1974  . Appendectomy  1965  . Tonsillectomy  1964  . Cholecystectomy  08/1989  . Back surgery    . Ganglion cyst excision Right 1990's  . Ligament repair Right 1990's    "radial collateral" (12/27/2012)  . Hernia repair  02/2002    "ventral" (12/27/2012)  . Lumbar laminectomy  1989; 1990's    "twice" (12/27/2012)  . Posterior lumbar fusion  04/2002  . Bilateral salpingoophorectomy Bilateral 1978    "2 separate ORs; months apart" (12/27/2012)  . Dilation and curettage of uterus  09/1967    "after miscarriage" (12/27/2012)  . Mass excision Left 1990's    "groin" (12/27/2012)  . Tympanoplasty Bilateral 1966; 2001    "left; right" (12/27/2012)  . Nasal septum surgery  1969  . Finger arthroplasty Right 12/232011    "ring finger; fused it; put pin in it; 2nd joint" (12/27/2012)  . 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 Coronary angioplasty with stent placement  08/03/1997; 12/28/2012    1 + 1" (01/19/2013)  . Cardiac catheterization  2000's    "couple times" (12/27/2012)  .  Left heart catheterization with coronary angiogram N/A 12/28/2012    Procedure: LEFT HEART CATHETERIZATION WITH CORONARY ANGIOGRAM;  Surgeon: Sinclair Grooms, MD;  Location: [facility] CATH LAB;  Service: Cardiovascular;  Laterality: N/A;  . Percutaneous coronary stent intervention (pci-s)  12/28/2012    Procedure: PERCUTANEOUS CORONARY STENT INTERVENTION (PCI-S);  Surgeon: Sinclair Grooms, MD;  Location: [facility] CATH LAB;  Service: Cardiovascular;;  Prox LAD  . Fractional flow reserve wire  12/28/2012    Procedure: FRACTIONAL FLOW RESERVE WIRE;  Surgeon: Sinclair Grooms, MD;  Location: [facility] CATH LAB;  Service: Cardiovascular;;  PROX LAD  . Left heart catheterization with coronary angiogram N/A 01/20/2013    Procedure: LEFT HEART CATHETERIZATION WITH  CORONARY ANGIOGRAM;  Surgeon: Blane Ohara, MD;  Location: [facility] CATH LAB;  Service: Cardiovascular;  Laterality: N/A;   Family History  Problem Relation Age of Onset  . Coronary artery disease Mother   . Coronary artery disease Father   . Other Daughter     TBI  . Other Son     Suicide   History  Substance Use Topics  . Smoking status: Former Smoker -- 1.00 packs/day for 17 years    Types: Cigarettes    Quit date: 12/27/1981  . Smokeless tobacco: Never Used  . Alcohol Use: No   OB History    No data available     Review of Systems  Constitutional: Negative for fever and chills.  Musculoskeletal: Positive for joint swelling and arthralgias. Negative for gait problem.    Allergies  Atorvastatin; Ciprofloxacin; Levaquin; and Dilaudid  Home Medications   Prior to Admission medications   Medication Sig Start Date End Date Taking? Authorizing Provider  acidophilus (RISAQUAD) CAPS capsule Take 3 capsules by mouth daily.     Historical Provider, MD  albuterol (PROAIR HFA) 108 (90 BASE) MCG/ACT inhaler Inhale 2 puffs into the lungs every 6 (six) hours as needed for wheezing or shortness of breath.    Historical Provider, MD  aspirin EC 325 MG tablet Take 1 tablet (325 mg total) by mouth daily. 01/03/14   Jettie Booze, MD  b complex vitamins capsule Take 1 capsule by mouth daily.    Historical Provider, MD  bisoprolol (ZEBETA) 10 MG tablet Take 10 mg by mouth daily.    Historical Provider, MD  Budesonide-Formoterol Fumarate (SYMBICORT IN) Inhale 1 puff into the lungs 2 (two) times daily as needed (shortness of breath).    Historical Provider, MD  Cholecalciferol (VITAMIN D3 PO) Take 1 tablet by mouth daily.    Historical Provider, MD  Coenzyme Q10 (CO Q 10 PO) Take 1 tablet by mouth daily.    Historical Provider, MD  estrogen-methylTESTOSTERone (ESTRATEST) 1.25-2.5 MG per tablet Take 1 tablet by mouth at bedtime.    Historical Provider, MD  fish oil-omega-3 fatty acids 1000  MG capsule Take 1 g by mouth daily.    Historical Provider, MD  fluticasone (FLONASE) 50 MCG/ACT nasal spray Place 2 sprays into both nostrils daily as needed for allergies or rhinitis.    Historical Provider, MD  Ginger, Zingiber officinalis, (GINGER PO) Take 1 tablet by mouth daily.    Historical Provider, MD  HYDROcodone-acetaminophen (NORCO/VICODIN) 5-325 MG per tablet Take 0.5-1 tablets by mouth every 6 (six) hours as needed for moderate pain.    Historical Provider, MD  HYDROcodone-acetaminophen (NORCO/VICODIN) 5-325 MG per tablet Take 1 tablet by mouth every 6 (six) hours as needed for moderate pain. 06/22/13   Harrell Gave  W Lawyer, PA-C  metaxalone (SKELAXIN) 800 MG tablet Take 400-800 mg by mouth 2 (two) times daily as needed for muscle spasms.     Historical Provider, MD  nitroGLYCERIN (NITROLINGUAL) 0.4 MG/SPRAY spray Place 1 spray under the tongue every 5 (five) minutes as needed for chest pain. 12/29/12   Belva Crome III, MD  pantoprazole (PROTONIX) 40 MG tablet Take 40 mg by mouth daily.     Historical Provider, MD  rosuvastatin (CRESTOR) 20 MG tablet Take 20 mg by mouth at bedtime.    Historical Provider, MD  TURMERIC PO Take 800 mg by mouth 2 (two) times daily.     Historical Provider, MD   Triage Vitals: BP 142/55 mmHg  Pulse 80  Temp(Src) 98.8 F (37.1 C) (Oral)  Resp 16  Ht 5' 2.25" (1.581 m)  Wt 141 lb (63.957 kg)  BMI 25.59 kg/m2  SpO2 97%  Physical Exam  Constitutional: She is oriented to person, place, and time. She appears well-developed and well-nourished. No distress.  HENT:  Head: Normocephalic and atraumatic.  Eyes: Conjunctivae and EOM are normal.  Neck: Neck supple. No tracheal deviation present.  Cardiovascular: Normal rate.   Pulmonary/Chest: Effort normal. No respiratory distress.  Musculoskeletal: Normal range of motion. She exhibits edema and tenderness.  Left foot: warm to touch, pitting edema, distal pulses intact.  Left knee: mild swelling, ROM  limited due to pain, ligaments are stable, negative McMurray's, equivocal  anterior drawer.  right foot: tender over 5th metatarsal head, full ROM of ankle and toes, NVI.  Left wrist no bony tenderness,tenderness with flexion and extension of wrist.   Neurological: She is alert and oriented to person, place, and time.  Skin: Skin is warm and dry.  Psychiatric: She has a normal mood and affect. Her behavior is normal.  Nursing note and vitals reviewed.   ED Course  Procedures (including critical care time) DIAGNOSTIC STUDIES: Oxygen Saturation is 97% on RA, normal by my interpretation.    COORDINATION OF CARE: 10:02 PM-Discussed treatment plan with pt at bedside and pt agreed to plan.     Labs Review Labs Reviewed - No data to display  Imaging Review No results found.   EKG Interpretation None      MDM   Final diagnoses:  Foot pain, right  Pedal edema  Knee injury, left, initial encounter   Patient X-Ray negative for obvious fracture or dislocation. Pain managed in ED. Pt advised to follow up with orthopedics if symptoms persist for possibility of missed fracture diagnosis. Patient given brace while in ED, conservative therapy recommended and discussed. Patient will be dc home & is agreeable with above plan. Patient given Lovenox injection to cover for possible dvt. Return in the morning for Duplex    I personally performed the services described in this documentation, which was scribed in my presence. The recorded information has been reviewed and is accurate.        Margarita Mail, PA-C 03/31/14 [id number]  Julianne Rice, MD 03/31/14 [phone]

## 2014-03-31 ENCOUNTER — Ambulatory Visit (HOSPITAL_COMMUNITY)
Admission: RE | Admit: 2014-03-31 | Discharge: 2014-03-31 | Disposition: A | Discharge status: Home / Self Care | Payer: Medicare Other | Source: Ambulatory Visit | Attending: Family Medicine | Admitting: Family Medicine

## 2014-03-31 DIAGNOSIS — M79609 Pain in unspecified limb: Secondary | ICD-10-CM

## 2014-03-31 DIAGNOSIS — M79606 Pain in leg, unspecified: Secondary | ICD-10-CM | POA: Diagnosis present

## 2014-03-31 DIAGNOSIS — R609 Edema, unspecified: Secondary | ICD-10-CM

## 2014-03-31 MED ORDER — HYDROCODONE-ACETAMINOPHEN 5-325 MG PO TABS: 1.0000 | ORAL_TABLET | Freq: Four times a day (QID) | ORAL | Status: DC | PRN

## 2014-03-31 NOTE — Progress Notes (Signed)
*  Preliminary Results* Bilateral lower extremity venous duplex completed. Bilateral lower extremities are negative for deep vein thrombosis. There is no evidence of Baker's cyst bilaterally.  03/31/2014  Maudry Mayhew, RVT, RDCS, RDMS

## 2014-03-31 NOTE — Discharge Instructions (Signed)
Your Images were negative for fracture. Please follow up with the orthopedist. Apply ice to the affected area. Return for DVT study in the morning. Deep Vein Thrombosis A deep vein thrombosis (DVT) is a blood clot that develops in the deep, larger veins of the leg, arm, or pelvis. These are more dangerous than clots that might form in veins near the surface of the body. A DVT can lead to serious and even life-threatening complications if the clot breaks off and travels in the bloodstream to the lungs.  A DVT can damage the valves in your leg veins so that instead of flowing upward, the blood pools in the lower leg. This is called post-thrombotic syndrome, and it can result in pain, swelling, discoloration, and sores on the leg. CAUSES Usually, several things contribute to the formation of blood clots. Contributing factors include:  The flow of blood slows down.  The inside of the vein is damaged in some way.  You have a condition that makes blood clot more easily. RISK FACTORS Some people are more likely than others to develop blood clots. Risk factors include:   Smoking.  Being overweight (obese).  Sitting or lying still for a long time. This includes long-distance travel, paralysis, or recovery from an illness or surgery. Other factors that increase risk are:   Older age, especially over 59 years of age.  Having a family history of blood clots or if you have already had a blot clot.  Having major or lengthy surgery. This is especially true for surgery on the hip, knee, or belly (abdomen). Hip surgery is particularly high risk.  Having a long, thin tube (catheter) placed inside a vein during a medical procedure.  Breaking a hip or leg.  Having cancer or cancer treatment.  Pregnancy and childbirth.  Hormone changes make the blood clot more easily during pregnancy.  The fetus puts pressure on the veins of the pelvis.  There is a risk of injury to veins during delivery or a  caesarean delivery. The risk is highest just after childbirth.  Medicines containing the female hormone estrogen. This includes birth control pills and hormone replacement therapy.  Other circulation or heart problems.  SIGNS AND SYMPTOMS When a clot forms, it can either partially or totally block the blood flow in that vein. Symptoms of a DVT can include:  Swelling of the leg or arm, especially if one side is much worse.  Warmth and redness of the leg or arm, especially if one side is much worse.  Pain in an arm or leg. If the clot is in the leg, symptoms may be more noticeable or worse when standing or walking. The symptoms of a DVT that has traveled to the lungs (pulmonary embolism, PE) usually start suddenly and include:  Shortness of breath.  Coughing.  Coughing up blood or blood-tinged mucus.  Chest pain. The chest pain is often worse with deep breaths.  Rapid heartbeat. Anyone with these symptoms should get emergency medical treatment right away. Do not wait to see if the symptoms will go away. Call your local emergency services (911 in the [place] if you have these symptoms. Do not drive yourself to the hospital. DIAGNOSIS If a DVT is suspected, your health care provider will take a full medical history and perform a physical exam. Tests that also may be required include:  Blood tests, including studies of the clotting properties of the blood.  Ultrasound to see if you have clots in your legs or lungs.  X-rays to show the flow of blood when dye is injected into the veins (venogram).  Studies of your lungs if you have any chest symptoms. PREVENTION  Exercise the legs regularly. Take a brisk 30-minute walk every day.  Maintain a weight that is appropriate for your height.  Avoid sitting or lying in bed for long periods of time without moving your legs.  Women, particularly those over the age of 57 years, should consider the risks and benefits of taking estrogen  medicines, including birth control pills.  Do not smoke, especially if you take estrogen medicines.  Long-distance travel can increase your risk of DVT. You should exercise your legs by walking or pumping the muscles every hour.  Many of the risk factors above relate to situations that exist with hospitalization, either for illness, injury, or elective surgery. Prevention may include medical and nonmedical measures.  Your health care provider will assess you for the need for venous thromboembolism prevention when you are admitted to the hospital. If you are having surgery, your surgeon will assess you the day of or day after surgery. TREATMENT Once identified, a DVT can be treated. It can also be prevented in some circumstances. Once you have had a DVT, you may be at increased risk for a DVT in the future. The most common treatment for DVT is blood-thinning (anticoagulant) medicine, which reduces the blood's tendency to clot. Anticoagulants can stop new blood clots from forming and stop old clots from growing. They cannot dissolve existing clots. Your body does this by itself over time. Anticoagulants can be given by mouth, through an IV tube, or by injection. Your health care provider will determine the best program for you. Other medicines or treatments that may be used are:  Heparin or related medicines (low molecular weight heparin) are often the first treatment for a blood clot. They act quickly. However, they cannot be taken orally and must be given either in shot form or by IV tube.  Heparin can cause a fall in a component of blood that stops bleeding and forms blood clots (platelets). You will be monitored with blood tests to be sure this does not occur.  Warfarin is an anticoagulant that can be swallowed. It takes a few days to start working, so usually heparin or related medicines are used in combination. Once warfarin is working, heparin is usually stopped.  Factor Xa inhibitor  medicines, such as rivaroxaban and apixaban, also reduce blood clotting. These medicines are taken orally and can often be used without heparin or related medicines.  Less commonly, clot dissolving drugs (thrombolytics) are used to dissolve a DVT. They carry a high risk of bleeding, so they are used mainly in severe cases where your life or a part of your body is threatened.  Very rarely, a blood clot in the leg needs to be removed surgically.  If you are unable to take anticoagulants, your health care provider may arrange for you to have a filter placed in a main vein in your abdomen. This filter prevents clots from traveling to your lungs. HOME CARE INSTRUCTIONS  Take all medicines as directed by your health care provider.  Learn as much as you can about DVT.  Wear a medical alert bracelet or carry a medical alert card.  Ask your health care provider how soon you can go back to normal activities. It is important to stay active to prevent blood clots. If you are on anticoagulant medicine, avoid contact sports.  It is very  important to exercise. This is especially important while traveling, sitting, or standing for long periods of time. Exercise your legs by walking or by tightening and relaxing your leg muscles regularly. Take frequent walks.  You may need to wear compression stockings. These are tight elastic stockings that apply pressure to the lower legs. This pressure can help keep the blood in the legs from clotting. Taking Warfarin Warfarin is a daily medicine that is taken by mouth. Your health care provider will advise you on the length of treatment (usually 3-6 months, sometimes lifelong). If you take warfarin:  Understand how to take warfarin and foods that can affect how warfarin works in Veterinary surgeon.  Too much and too little warfarin are both dangerous. Too much warfarin increases the risk of bleeding. Too little warfarin continues to allow the risk for blood clots. Warfarin and  Regular Blood Testing While taking warfarin, you will need to have regular blood tests to measure your blood clotting time. These blood tests usually include both the prothrombin time (PT) and international normalized ratio (INR) tests. The PT and INR results allow your health care provider to adjust your dose of warfarin. It is very important that you have your PT and INR tested as often as directed by your health care provider.  Warfarin and Your Diet Avoid major changes in your diet, or notify your health care provider before changing your diet. Arrange a visit with a registered dietitian to answer your questions. Many foods, especially foods high in vitamin K, can interfere with warfarin and affect the PT and INR results. You should eat a consistent amount of foods high in vitamin K. Foods high in vitamin K include:   Spinach, kale, broccoli, cabbage, collard and turnip greens, Brussels sprouts, peas, cauliflower, seaweed, and parsley.  Beef and pork liver.  Green tea.  Soybean oil. Warfarin with Other Medicines Many medicines can interfere with warfarin and affect the PT and INR results. You must:  Tell your health care provider about any and all medicines, vitamins, and supplements you take, including aspirin and other over-the-counter anti-inflammatory medicines. Be especially cautious with aspirin and anti-inflammatory medicines. Ask your health care provider before taking these.  Do not take or discontinue any prescribed or over-the-counter medicine except on the advice of your health care provider or pharmacist. Warfarin Side Effects Warfarin can have side effects, such as easy bruising and difficulty stopping bleeding. Ask your health care provider or pharmacist about other side effects of warfarin. You will need to:  Hold pressure over cuts for longer than usual.  Notify your dentist and other health care providers that you are taking warfarin before you undergo any procedures  where bleeding may occur. Warfarin with Alcohol and Tobacco   Drinking alcohol frequently can increase the effect of warfarin, leading to excess bleeding. It is best to avoid alcoholic drinks or to consume only very small amounts while taking warfarin. Notify your health care provider if you change your alcohol intake.   Do not use any tobacco products including cigarettes, chewing tobacco, or electronic cigarettes. If you smoke, quit. Ask your health care provider for help with quitting smoking. Alternative Medicines to Warfarin: Factor Xa Inhibitor Medicines  These blood-thinning medicines are taken by mouth, usually for several weeks or longer. It is important to take the medicine every single day at the same time each day.  There are no regular blood tests required when using these medicines.  There are fewer food and drug interactions than  with warfarin.  The side effects of this class of medicine are similar to those of warfarin, including excessive bruising or bleeding. Ask your health care provider or pharmacist about other potential side effects. SEEK MEDICAL CARE IF:  You notice a rapid heartbeat.  You feel weaker or more tired than usual.  You feel faint.  You notice increased bruising.  You feel your symptoms are not getting better in the time expected.  You believe you are having side effects of medicine. SEEK IMMEDIATE MEDICAL CARE IF:  You have chest pain.  You have trouble breathing.  You have new or increased swelling or pain in one leg.  You cough up blood.  You notice blood in vomit, in a bowel movement, or in urine. MAKE SURE YOU:  Understand these instructions.  Will watch your condition.  Will get help right away if you are not doing well or get worse. Document Released: 03/31/2005 Document Revised: 08/15/2013 Document Reviewed: 12/06/2012 [facility] Patient Information 2015 [place], [place]. This information is not intended to replace advice given  to you by your health care provider. Make sure you discuss any questions you have with your health care provider. Wrist Pain Wrist injuries are frequent in adults and children. A sprain is an injury to the ligaments that hold your bones together. A strain is an injury to muscle or muscle cord-like structures (tendons) from stretching or pulling. Generally, when wrists are moderately tender to touch following a fall or injury, a break in the bone (fracture) may be present. Most wrist sprains or strains are better in 3 to 5 days, but complete healing may take several weeks. HOME CARE INSTRUCTIONS   Put ice on the injured area.  Put ice in a plastic bag.  Place a towel between your skin and the bag.  Leave the ice on for 15-20 minutes, 3-4 times a day, for the first 2 days, or as directed by your health care provider.  Keep your arm raised above the level of your heart whenever possible to reduce swelling and pain.  Rest the injured area for at least 48 hours or as directed by your health care provider.  If a splint or elastic bandage has been applied, use it for as long as directed by your health care provider or until seen by a health care provider for a follow-up exam.  Only take over-the-counter or prescription medicines for pain, discomfort, or fever as directed by your health care provider.  Keep all follow-up appointments. You may need to follow up with a specialist or have follow-up X-rays. Improvement in pain level is not a guarantee that you did not fracture a bone in your wrist. The only way to determine whether or not you have a broken bone is by X-ray. SEEK IMMEDIATE MEDICAL CARE IF:   Your fingers are swollen, very red, white, or cold and blue.  Your fingers are numb or tingling.  You have increasing pain.  You have difficulty moving your fingers. MAKE SURE YOU:   Understand these instructions.  Will watch your condition.  Will get help right away if you are not doing  well or get worse. Document Released: 01/08/2005 Document Revised: 04/05/2013 Document Reviewed: 05/22/2010 [facility] Patient Information 2015 [place], [place]. This information is not intended to replace advice given to you by your health care provider. Make sure you discuss any questions you have with your health care provider. Knee Sprain A knee sprain is a tear in one of the strong, fibrous  tissues that connect the bones (ligaments) in your knee. The severity of the sprain depends on how much of the ligament is torn. The tear can be either partial or complete. CAUSES  Often, sprains are a result of a fall or injury. The force of the impact causes the fibers of your ligament to stretch too much. This excess tension causes the fibers of your ligament to tear. SIGNS AND SYMPTOMS  You may have some loss of motion in your knee. Other symptoms include:  Bruising.  Pain in the knee area.  Tenderness of the knee to the touch.  Swelling. DIAGNOSIS  To diagnose a knee sprain, your health care provider will physically examine your knee. Your health care provider may also suggest an X-ray exam of your knee to make sure no bones are broken. TREATMENT  If your ligament is only partially torn, treatment usually involves keeping the knee in a fixed position (immobilization) or bracing your knee for activities that require movement for several weeks. To do this, your health care provider will apply a bandage, cast, or splint to keep your knee from moving and to support your knee during movement until it heals. For a partially torn ligament, the healing process usually takes 4-6 weeks. If your ligament is completely torn, depending on which ligament it is, you may need surgery to reconnect the ligament to the bone or reconstruct it. After surgery, a cast or splint may be applied and will need to stay on your knee for 4-6 weeks while your ligament heals. HOME CARE INSTRUCTIONS  Keep your injured knee elevated  to decrease swelling.  To ease pain and swelling, apply ice to the injured area:  Put ice in a plastic bag.  Place a towel between your skin and the bag.  Leave the ice on for 20 minutes, 2-3 times a day.  Only take medicine for pain as directed by your health care provider.  Do not leave your knee unprotected until pain and stiffness go away (usually 4-6 weeks).  If you have a cast or splint, do not allow it to get wet. If you have been instructed not to remove it, cover it with a plastic bag when you shower or bathe. Do not swim.  Your health care provider may suggest exercises for you to do during your recovery to prevent or limit permanent weakness and stiffness. SEEK IMMEDIATE MEDICAL CARE IF:  Your cast or splint becomes damaged.  Your pain becomes worse.  You have significant pain, swelling, or numbness below the cast or splint. MAKE SURE YOU:  Understand these instructions.  Will watch your condition.  Will get help right away if you are not doing well or get worse. Document Released: 03/31/2005 Document Revised: 01/19/2013 Document Reviewed: 11/10/2012 [facility] Patient Information 2015 [place], [place]. This information is not intended to replace advice given to you by your health care provider. Make sure you discuss any questions you have with your health care provider.

## 2014-04-30 IMAGING — CR DG CHEST 2V
2 series · 2 of 2 positions shown · non-contrast
Comparison: 12/27/2012

CLINICAL DATA: Chest pain and shortness of breath

EXAM:
CHEST  2 VIEW

[w chest pa]
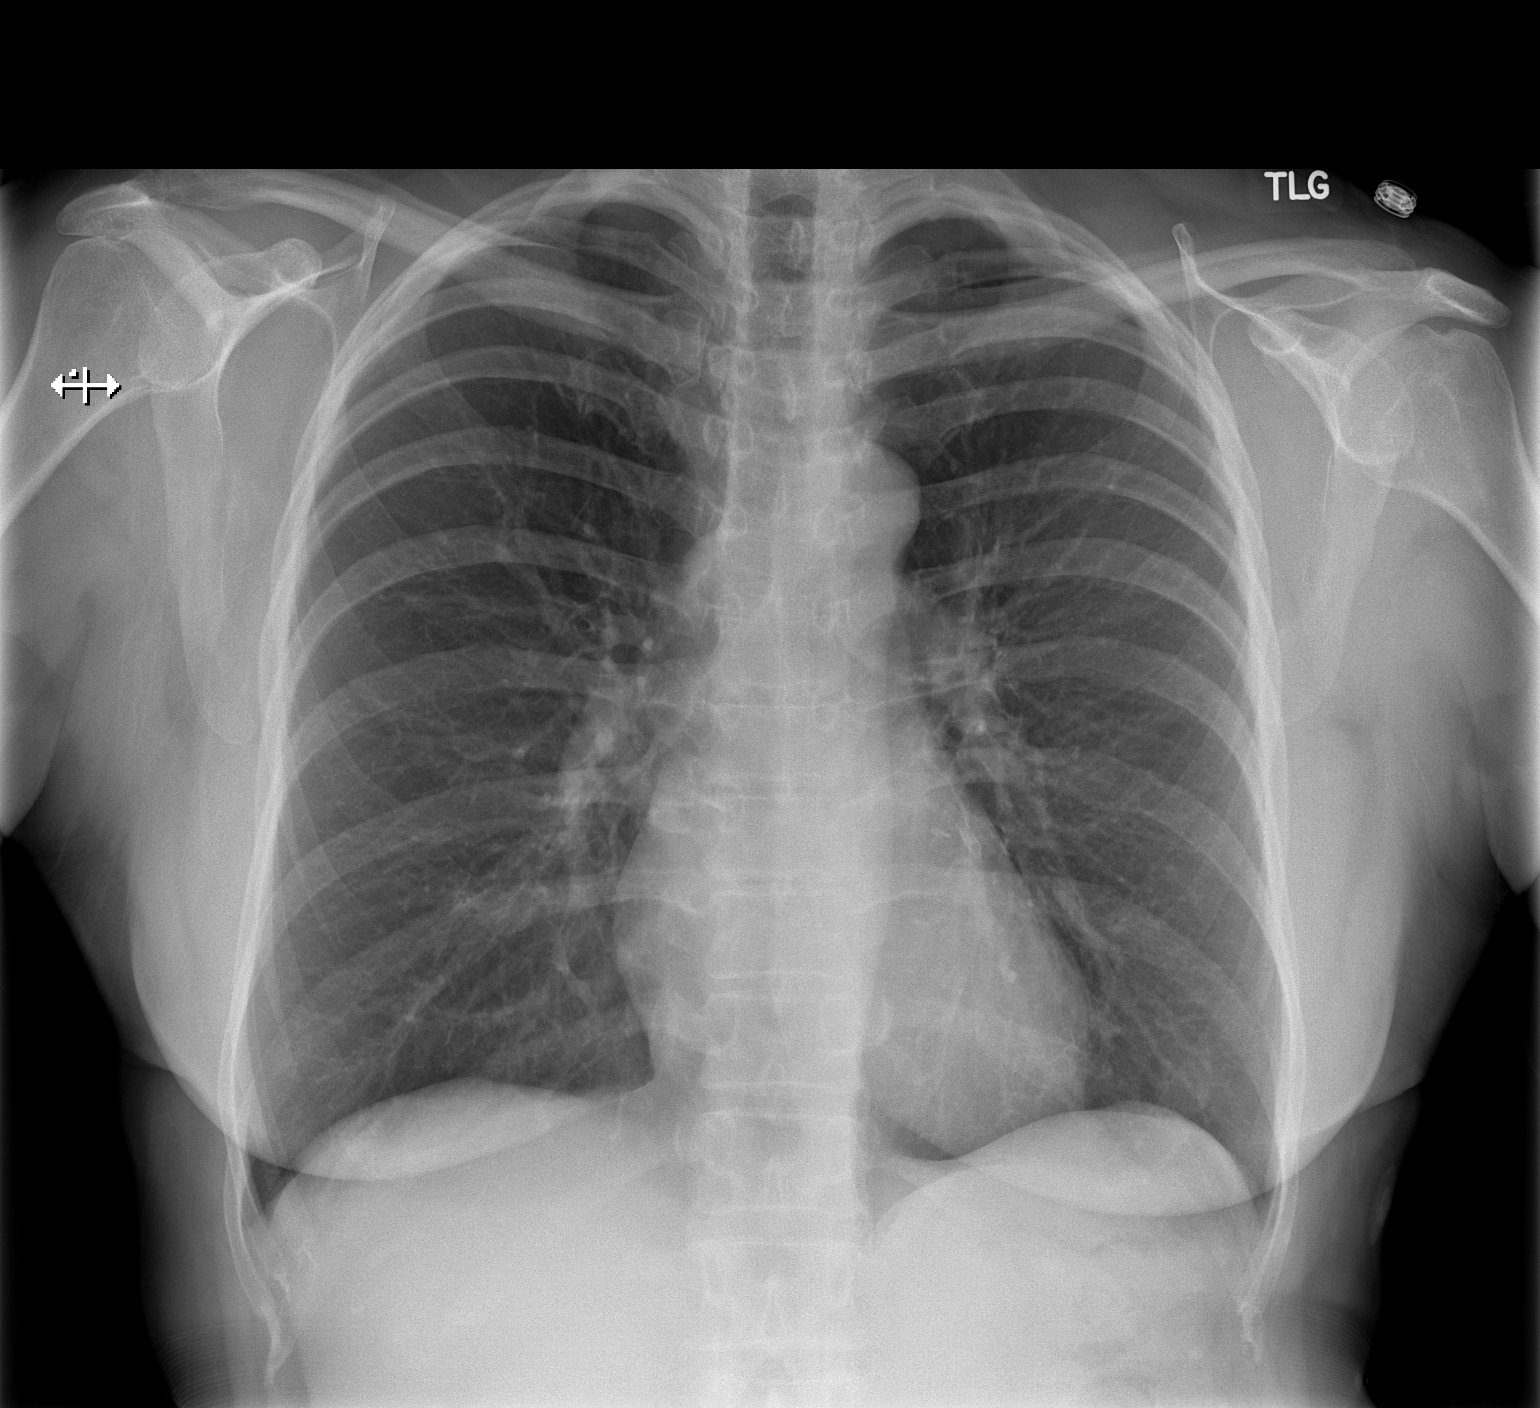

[w chest lat]
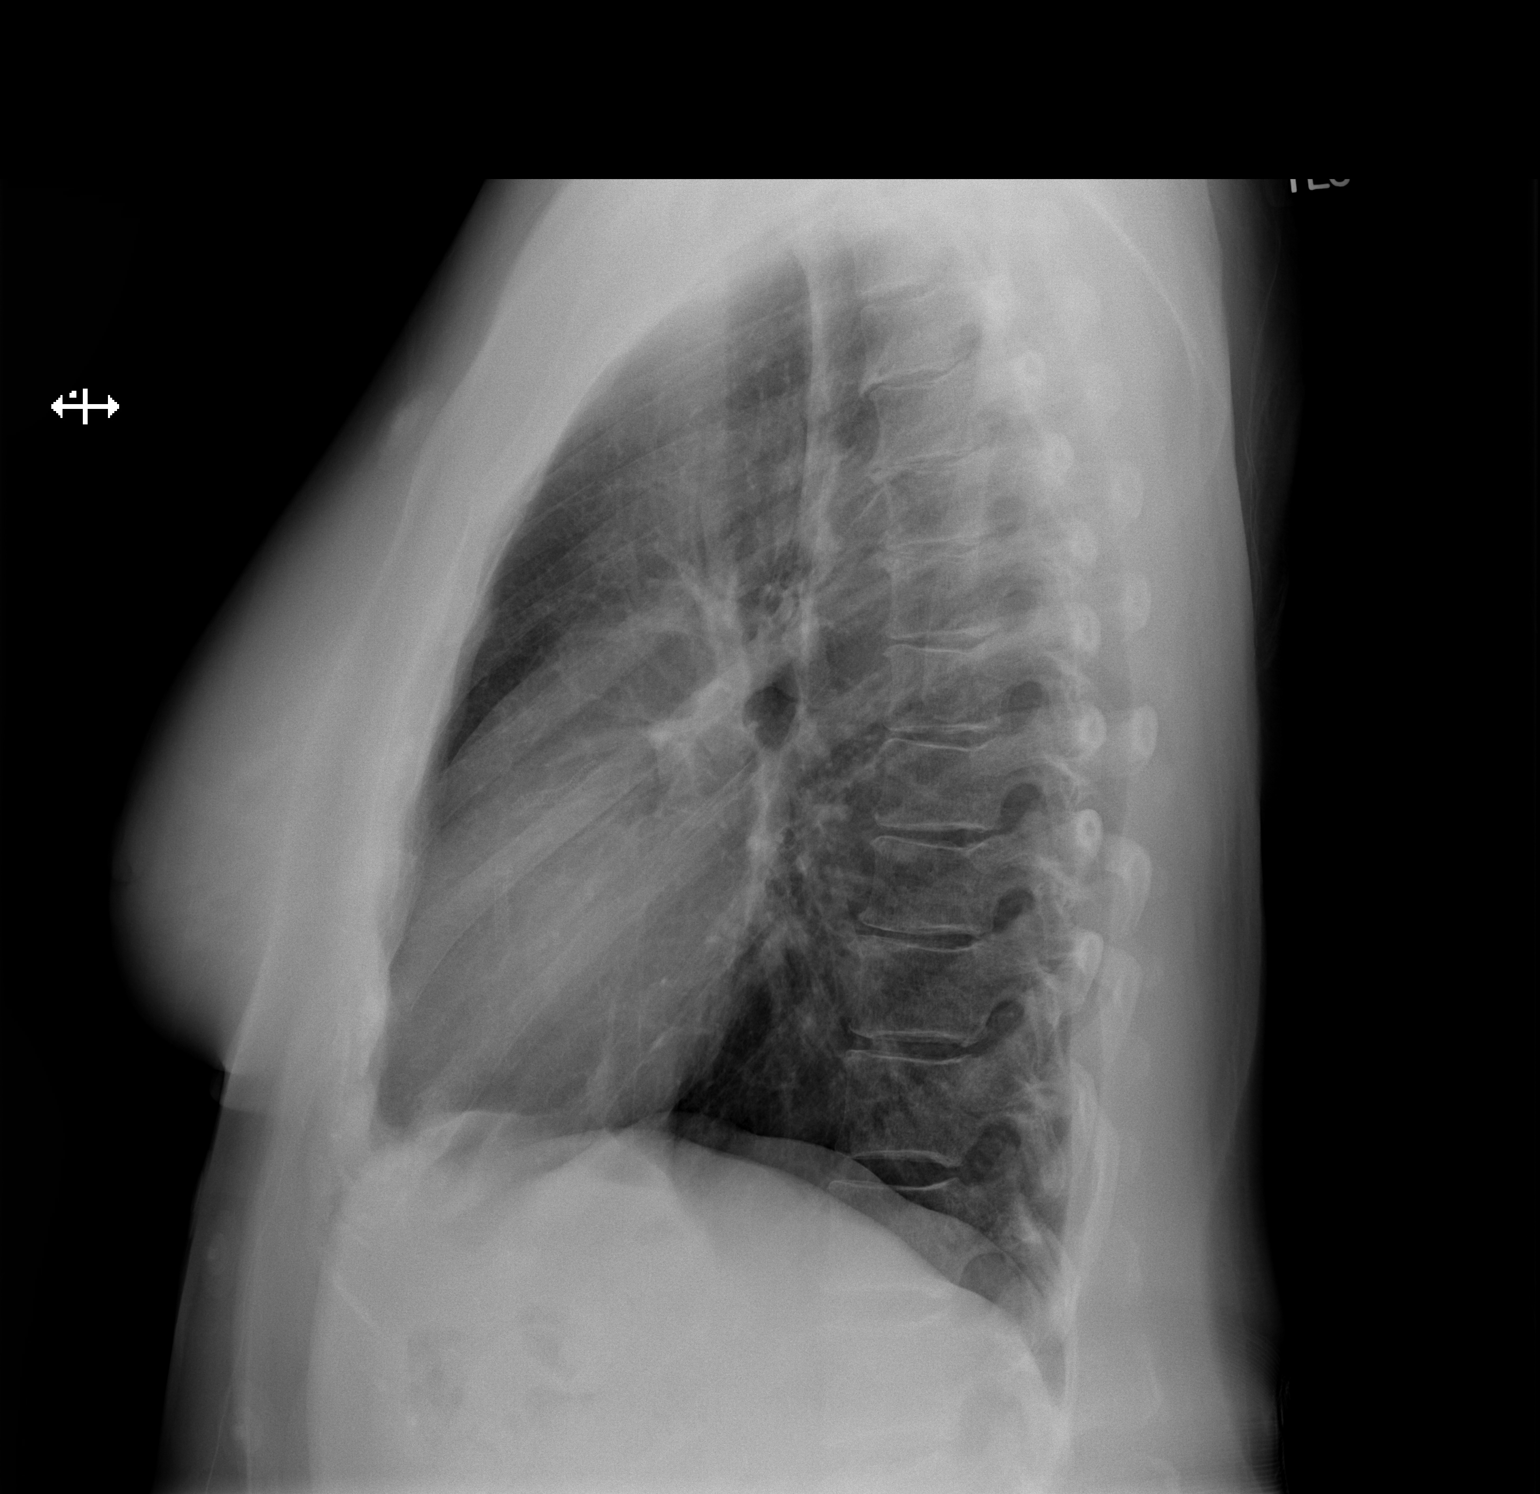

[2 of 2 positions shown; findings below may reference images not displayed]

FINDINGS: The heart size and mediastinal contours are within normal limits.
Coronary stents are noted. Both lungs are clear. The visualized
skeletal structures are unremarkable.
IMPRESSION: No acute abnormality noted.

## 2014-05-22 NOTE — Telephone Encounter (Signed)
error 

## 2014-06-16 ENCOUNTER — Telehealth: Payer: Self-pay | Admitting: Interventional Cardiology

## 2014-06-16 NOTE — Telephone Encounter (Signed)
Routed to Marsh Dolly., RN

## 2014-06-16 NOTE — Telephone Encounter (Signed)
New Message     Pt c/o BP issue: STAT if pt c/o blurred vision, one-sided weakness or slurred speech  1. What are your last 5 BP readings?94/55, 118/68 117/57, 110/52, 105/50, 106/60 2. Are you having any other symptoms (ex. Dizziness, headache, blurred vision, passed out)? No , she gets really tired   3. What is your BP issue? Bottom number is too low.

## 2014-06-16 NOTE — Telephone Encounter (Signed)
I called and spoke with the patient. She reports having continued BP readings in the 109-115/50-60 range. I spoke with her in December for the same thing. I advised her that these readings are in the "normal" range, however, she is continuing to have feelings of fatigue. She will go 12 hour periods where she cannot get out of bed. She will eat salt or drink power aid to get her BP readings up to where she feels good. She is concerned that she is having to do this. I advised her I do not have an answer as to why she is feeling this way. She only takes bystolic as needed. I advised her we could have her come in to discuss with the doctor. She does want to do this. She will follow up with Dr. Irish Lack on 3/14 at 8:45 am. I have advised her to continue sodium and power aid as needed for symptoms. She is agreeable.

## 2014-06-17 NOTE — Telephone Encounter (Signed)
I am fine with her BP readings and her holding the bystolic if needed, due to low BP.

## 2014-06-26 ENCOUNTER — Ambulatory Visit: Payer: Medicare Other | Admitting: Interventional Cardiology

## 2014-06-29 ENCOUNTER — Telehealth: Payer: Self-pay | Admitting: Neurology

## 2014-06-29 ENCOUNTER — Telehealth: Payer: Self-pay | Admitting: Interventional Cardiology

## 2014-06-29 NOTE — Telephone Encounter (Signed)
Called patient back to let her know that a response from Dr. Irish Lack remains pending. She verbalized appreciation for trying to get an answer for her today. Will plan to follow up tomorrow.

## 2014-06-29 NOTE — Telephone Encounter (Signed)
New message  Pt c/o BP issue: STAT if pt c/o blurred vision, one-sided weakness or slurred speech  1. What are your last 5 BP readings? 81/46, 119/52, 88/46, 115,64, 127/68  2. Are you having any other symptoms (ex. Dizziness, headache, blurred vision, passed out)? Extremely tired   3. What is your BP issue? Heart rate to high and she takes betablockers.

## 2014-06-29 NOTE — Telephone Encounter (Signed)
Patient called stating that she is very dissatisfied regarding her BP issues. She states that she cancelled her appointment Monday due to the response she received last week that Dr. Irish Lack felt her BP was "fine".  She is concerned though because she does not take bystolic and the note indicated that she could hold bystolic. She is prescribed bisoprolol but rarely takes it due to her low BP readings. She wants to see Dr. Irish Lack to discuss this issue. She does not want to wait two weeks. She is reluctant to see an Extended Provider. She wants Dr. Irish Lack notified today and advisement provided to her regarding what she needs to do at this time for her blood pressure/visit request. Next available appointment with Dr. Irish Lack is April 7th. The patient states this is unsatisfactory. She reiterates that she is symptomatic with her low BP's with severe fatigue. Paged Dr. Irish Lack. Nurse at hospital returned page. Dr. Irish Lack is currently scrubbed in to a case but will call back later.  Called patient again to let her know that Dr. Irish Lack has not called in yet. Patient given time to share her concerns. Seems to have ongoing Neurological symptoms as well. Advised patient to also notify Dr. [name], her neurologist of her continued symptoms of neck pain, "stuttering sometimes", occasional episodes where she feels she is not cognitively at her best, along with her cardiac complaints of occasional tachycardia and low BP/orthostatic issues. Advised patient to ask neurologist about postural orthostatic tachycardia symptoms. Will continue to await call from Dr. Irish Lack.

## 2014-06-29 NOTE — Telephone Encounter (Signed)
Expand All Collapse All   Patient called stating that she is very dissatisfied regarding her BP issues. She states that she cancelled her appointment Monday due to the response she received last week that Dr. Irish Lack felt her BP was "fine". She is concerned though because she does not take bystolic and the note indicated that she could hold bystolic. She is prescribed bisoprolol but rarely takes it due to her low BP readings. She wants to see Dr. Irish Lack to discuss this issue. She does not want to wait two weeks. She is reluctant to see an Extended Provider. She wants Dr. Irish Lack notified today and advisement provided to her regarding what she needs to do at this time for her blood pressure/visit request. Next available appointment with Dr. Irish Lack is April 7th. The patient states this is unsatisfactory. She reiterates that she is symptomatic with her low BP's with severe fatigue. Paged Dr. Irish Lack. Nurse at hospital returned page. Dr. Irish Lack is currently scrubbed in to a case but will call back later.  Called patient again to let her know that Dr. Irish Lack has not called in yet. Patient given time to share her concerns. Seems to have ongoing Neurological symptoms as well. Advised patient to also notify Dr. [name], her neurologist of her continued symptoms of neck pain, "stuttering sometimes", occasional episodes where she feels she is not cognitively at her best, along with her cardiac complaints of occasional tachycardia and low BP/orthostatic issues. Advised patient to ask neurologist about postural orthostatic tachycardia symptoms. Will continue to await call from Dr. Irish Lack.            Bo Mcclintock at 06/29/2014 11:55 AM     Status: Signed       Expand All Collapse All   New message  Pt c/o BP issue: STAT if pt c/o blurred vision, one-sided weakness or slurred speech  1. What are your last 5 BP readings? 81/46, 119/52, 88/46, 115,64, 127/68  2. Are you having any other  symptoms (ex. Dizziness, headache, blurred vision, passed out)? Extremely tired   3. What is your BP issue? Heart rate to high and she takes betablockers.          Message routed to Dr. Irish Lack with patient's request to be called by Dr. Irish Lack related to her concerns.

## 2014-06-29 NOTE — Telephone Encounter (Signed)
Pt called wanting to speak to a nurse regarding her current condition.  C/b [phone]

## 2014-06-29 NOTE — Telephone Encounter (Signed)
Patient called stating that she would like to see Dr. [name].

## 2014-06-29 NOTE — Telephone Encounter (Signed)
Dr. Irish Lack called me back about this patient. My former note was an error and should have stated she was on bisoprolol not bystolic. However, per Dr. Irish Lack, he feels that her BP issue is not urgent at this time and that she can see an extender to address this if this is a true concern for her. Per Dr. Irish Lack, if the patient is not satisfied with his availability he is ok if she feels she needs to see another provider (per Dr. Irish Lack, the patient has already stated in the past that she did not want to see Dr. Tamala Julian). She may also follow up with her PCP for her blood pressure issues if she does not wish to see an extender in our office or the timing is not good for her. He apologized if the patient was not satisfied with the turn around time of his response as he was in procedures all day.

## 2014-06-30 NOTE — Telephone Encounter (Signed)
Spoke to patient to check on her status today. Her systolic BP is up today to 107 and she states she does feel better today from yesterday. She called her neurologist and has an appointment on Monday, March 21st, to see Dr. [name]. She said once she has spoken to Dr. [name] she will determine if she needs to reschedule with a cardiologist. Patient again expressed appreciation for the call to check on her today.

## 2014-07-03 ENCOUNTER — Encounter: Payer: Self-pay | Admitting: Neurology

## 2014-07-03 ENCOUNTER — Ambulatory Visit (INDEPENDENT_AMBULATORY_CARE_PROVIDER_SITE_OTHER): Payer: Medicare Other | Admitting: Neurology

## 2014-07-03 VITALS — BP 140/70 | HR 91 | Ht 62.0 in | Wt 145.0 lb

## 2014-07-03 DIAGNOSIS — G3184 Mild cognitive impairment, so stated: Secondary | ICD-10-CM

## 2014-07-03 DIAGNOSIS — R42 Dizziness and giddiness: Secondary | ICD-10-CM

## 2014-07-03 DIAGNOSIS — I951 Orthostatic hypotension: Secondary | ICD-10-CM

## 2014-07-03 DIAGNOSIS — R413 Other amnesia: Secondary | ICD-10-CM

## 2014-07-03 NOTE — Progress Notes (Signed)
Follow-up Visit   Date: 07/03/2014    Briana Bowers MRN: [id number] DOB: [birth year]   Interim History: Briana Bowers is a 64 y.o. right-handed Caucasian female with  history of CAD s/p PCI stent (12/2012), hyperlipidemia, chronic back pain with disc herniation s/p lumbar fusion L4-S1 (2004), prediabetes, GERD, and anxiety returning to the clinic with new complaints of low blood pressure and memory changes.  The patient was accompanied to the clinic by sister who also provides collateral information.    History of present illness: Patient initially seen on 07/29/2013 for evaluation of left arm pain and atypical chest pain.  During summer 2014, she developed left hand and elbow pain which progressively worsened. She was told she had tennis elbow and did physical therapy little relief. On September 15th, she developed acute onset of left under arm and shoulder pain and went to the emergency department. She was evaluated by cardiology had a stent placed to LAD in September 2014 but had persistent chest discomfort despite intervention. She underwent repeat catheterization which showed patent stents. She continues to have left-sided squeezing in her chest and has not improved trial of nortriptyline.  Pain is localized "where I have the stents" and describes it as a "tight sensation like a blood pressure cuff". Pain is described as hot burning, starting in her left breast and upper arm radiating into her left forearm. Pain is improved if she is not using it. It is not worse with activity. She is taking nortrilptyline 50mg  without significant improvement. She denies any associated weakness, numbness, or tingling. She practiced neuromuscular studies and reports her pain was alleviated by "working on her anterior cervical muscles".   She had similar symptoms after her first sent in 1999s which resolved with nortriptyline.  MRI cervical and thoracic spine which does show nerve impingement at  C5-6 and T1-2 performed in April 2015. Recommended that she increase neurontin to 200mg  in the morning and 300mg  at bedtime, but due to intolerance of medication, it was stopped.  UPDATE 07/03/2014:  Patient made appointment to see me today with complaints of low blood pressure and constantly feeling exhausted.  She contact her cardiologist's office who nurse recommended that she make an appointment to see her neurologist for POTS.  She was not evaluated in the clinic.  Her blood pressure has been ranging 90/50s and if she drinks powerade it will increased into SBP 110s. She noticed that when her blood pressure is low, her heart rate increases.  She is drinking about 5L of water daily.  She denies positional lightheadedness, but notices when she is sitting, she can have a wave of lightheadedness, which is brief and spontaneously improved.   Denies any tremor, stiffness, or dysphagia.  She is also stuttering and having memory problems. She is forgetting names frequently and has to focus more.  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  Medications:  Current Outpatient Prescriptions on File Prior to Visit  Medication Sig Dispense Refill  . acidophilus (RISAQUAD) CAPS capsule Take 3 capsules by mouth daily.     Marland Kitchen albuterol (PROAIR HFA) 108 (90 BASE) MCG/ACT inhaler Inhale 2 puffs into the lungs every 6 (six) hours as needed for wheezing or shortness of breath.    Marland Kitchen aspirin EC 325 MG tablet Take 1 tablet (325 mg total) by mouth daily. 30 tablet 0  . b complex vitamins capsule Take 1 capsule by mouth daily.    Marland Kitchen  bisoprolol (ZEBETA) 10 MG tablet Take 10 mg by mouth daily.    . Budesonide-Formoterol Fumarate (SYMBICORT IN) Inhale 1 puff into the lungs 2 (two) times daily as needed (shortness of breath).    . Cholecalciferol (VITAMIN D3 PO) Take 1 tablet by mouth daily.    . Coenzyme Q10 (CO Q 10 PO) Take 1 tablet by mouth daily.    Marland Kitchen  estrogen-methylTESTOSTERone (ESTRATEST) 1.25-2.5 MG per tablet Take 1 tablet by mouth at bedtime.    . fish oil-omega-3 fatty acids 1000 MG capsule Take 1 g by mouth daily.    . fluticasone (FLONASE) 50 MCG/ACT nasal spray Place 2 sprays into both nostrils daily as needed for allergies or rhinitis.    . Ginger, Zingiber officinalis, (GINGER PO) Take 1 tablet by mouth daily.    Marland Kitchen HYDROcodone-acetaminophen (NORCO/VICODIN) 5-325 MG per tablet Take 1 tablet by mouth every 6 (six) hours as needed for moderate pain. 15 tablet 0  . metaxalone (SKELAXIN) 800 MG tablet Take 400-800 mg by mouth 2 (two) times daily as needed for muscle spasms.     . nitroGLYCERIN (NITROLINGUAL) 0.4 MG/SPRAY spray Place 1 spray under the tongue every 5 (five) minutes as needed for chest pain. 12 g 12  . pantoprazole (PROTONIX) 40 MG tablet Take 40 mg by mouth daily.     . rosuvastatin (CRESTOR) 20 MG tablet Take 20 mg by mouth at bedtime.    . TURMERIC PO Take 800 mg by mouth 2 (two) times daily.      No current facility-administered medications on file prior to visit.    Allergies:  Allergies  Allergen Reactions  . Atorvastatin     Muscle soreness  . Ciprofloxacin Other (See Comments)    "makes me feel very weak"  . Levaquin [Levofloxacin In D5w] Other (See Comments)    "makes me feel very weak"  . Dilaudid [Hydromorphone Hcl] Other (See Comments)    "makes me really sleepy"    Review of Systems:  CONSTITUTIONAL: No fevers, chills, night sweats, or weight loss.  EYES: No visual changes or eye pain ENT: No hearing changes.  No history of nose bleeds.   RESPIRATORY: No cough, wheezing and shortness of breath.   CARDIOVASCULAR: Negative for chest pain, and palpitations.   GI: Negative for abdominal discomfort, blood in stools or black stools.  No recent change in bowel habits.   GU:  No history of incontinence.   MUSCLOSKELETAL: No history of joint pain or swelling.  No myalgias.   SKIN: Negative for lesions,  rash, and itching.   ENDOCRINE: Negative for cold or heat intolerance, polydipsia or goiter.   PSYCH:  No depression or anxiety symptoms.   NEURO: As Above.   Vital Signs:  BP 140/70 mmHg  Pulse 91  Ht 5\' 2"  (1.575 m)  Wt 145 lb (65.772 kg)  BMI 26.51 kg/m2  SpO2 98%   HR BP Supine  71 130/70 Sitting  71 130/74 Standing 78 130/70  General: Well-appearing Neck: No carotid bruit CV: regular rate and rhythm Ext: No edema   Neurological Exam: MENTAL STATUS including orientation to time, place, person, recent and remote memory, attention span and concentration, language, and fund of knowledge is fairly intact.  Speech is not dysarthric, no stuttering apparent on exam  Montreal Cognitive Assessment  07/03/2014 07/03/2014  Visuospatial/ Executive (0/5) 3 3  Naming (0/3) 3 3  Attention: Read list of digits (0/2) 2 -  Attention: Read list of letters (0/1) 1 -  Attention: Serial 7 subtraction starting at 100 (0/3) 1 -  Language: Repeat phrase (0/2) 2 -  Language : Fluency (0/1) 1 -  Abstraction (0/2) 1 -  Delayed Recall (0/5) 1 -  Orientation (0/6) 6 -  Total 21 -  Adjusted Score (based on education) 21 -    CRANIAL NERVES: Unremarkable fundoscopic exam.  No visual field defects. Pupils equal round and reactive to light.  Normal conjugate, extra-ocular eye movements in all directions of gaze.  No ptosis. Normal facial sensation.  Face is symmetric. Palate elevates symmetrically.  Tongue is midline.  No pathological facial reflexes.  MOTOR:  Motor strength is 5/5 in all extremities.  No atrophy, fasciculations or abnormal movements.  No pronator drift.  Tone is normal.    MSRs:  Reflexes are 2+/4 throughout.  SENSORY:  Intact to vibration throughout.  COORDINATION/GAIT:  Normal finger-to- nose-finger and heel-to-shin.  Intact rapid alternating movements bilaterally.  Gait narrow based and stable.   Data: EMG of upper extremity 10/03/2013: There is electrophysiological evidence  of a chronic C7 radiculopathy affecting the left upper extremity. Overall, these findings are mild in degree electrically.  MRI cervical and thoracic spine wo contrast 08/11/2013: 1. Multilevel degenerative disc disease and facet arthrosis in the cervical spine. There is moderate spinal stenosis at C5-6 with multilevel moderate to severe bilateral neural foraminal stenosis as above. 2. Moderate neural foraminal stenosis at T1-T2 degenerative disc disease and facet arthrosis. No thoracic spinal stenosis.  Labs 06/13/2013: LDL 80, triglyceride 64, HDL 44, total cholesterol 137, hemoglobin A1c 5.9  MRI lumbar spine 03/17/2012:  Satisfactory fusion L4-5 and L5-S1 without stenosis  Disc degeneration and spondylosis with mild spinal stenosis L2-3.  Mild to moderate spinal stenosis at L3-4.  IMPRESSION/PLAN: Ms. Osuna is a 64 year-old female presenting with new complaints of memory changes and low blood pressure. Her cognitive exam discloses deficits involving nearly all the domains (MOCA 21/30).  I will obtain MRI brain and neuropsychological testing to better assess the type of cognitive disorder (i.e. dementia, mood-related, etc). There is no evidence of bradykinesia, increased tone, or weakness.  Regarding her low blood pressure, I have requested that she follow-up with her PCP.  Her blood pressure and orthostatic vital signs were within normal limits today. I do not think she has POTS since her primary complaint is low blood pressure and not tachycardia. If needed, we can refer her to tilt table testing.  In the meantime, I encouraged her to stay well-hydrated and wear compression stockings to see if this helps.  With her memory problems, multiple systems atrophy can be considered, but the absence of other clinical features makes it unlikely.    Return to clinic in 3 months   The duration of this appointment visit was 40 minutes of face-to-face time with the patient.  Greater than 50% of this time  was spent in counseling, explanation of diagnosis, planning of further management, and coordination of care.   Thank you for allowing me to participate in patient's care.  If I can answer any additional questions, I would be pleased to do so.    Sincerely,    Donika K. [name], DO

## 2014-07-03 NOTE — Progress Notes (Signed)
Note faxed.

## 2014-07-03 NOTE — Patient Instructions (Addendum)
1.  MRI brain wo contrast 2.  Formal neuropsychological testing 3.  Continue to stay well-hydrated and start using compression stocking 4.  Please follow-up with your primary care doctor for low blood pressure 5.  Return to clinic in 3 months

## 2014-07-12 ENCOUNTER — Telehealth: Payer: Self-pay | Admitting: Neurology

## 2014-07-12 NOTE — Telephone Encounter (Signed)
Letter received from [facility] stating that MRI brain has been authorized #[id number] valid for 07/16/2014.  Donika K. [name], DO

## 2014-07-15 ENCOUNTER — Other Ambulatory Visit: Payer: Medicare Other

## 2014-07-16 ENCOUNTER — Ambulatory Visit
Admission: RE | Admit: 2014-07-16 | Discharge: 2014-07-16 | Disposition: A | Discharge status: Home / Self Care | Payer: Medicare Other | Source: Ambulatory Visit | Attending: Neurology | Admitting: Neurology

## 2014-07-16 DIAGNOSIS — I951 Orthostatic hypotension: Secondary | ICD-10-CM

## 2014-07-16 DIAGNOSIS — G3184 Mild cognitive impairment, so stated: Secondary | ICD-10-CM

## 2014-09-05 ENCOUNTER — Telehealth: Payer: Self-pay | Admitting: *Deleted

## 2014-09-05 NOTE — Telephone Encounter (Signed)
Patient called requesting a letter for jury duties she does not fell that she can do it and needs a letter explaining her Dx and why not Call back number [phone]

## 2014-09-06 ENCOUNTER — Encounter: Payer: Self-pay | Admitting: Neurology

## 2014-09-06 ENCOUNTER — Telehealth: Payer: Self-pay | Admitting: *Deleted

## 2014-09-06 NOTE — Telephone Encounter (Signed)
Letter mailed per patient's request.

## 2014-09-06 NOTE — Telephone Encounter (Signed)
Left message for patient that letter is ready.  Instructed her to call back to let me know if she will be picking it up or would like it mailed.

## 2014-09-06 NOTE — Telephone Encounter (Signed)
Letter written

## 2014-09-06 NOTE — Telephone Encounter (Signed)
Patient would like her letter mailed to her address

## 2014-09-06 NOTE — Telephone Encounter (Signed)
Letter mailed

## 2014-10-09 ENCOUNTER — Ambulatory Visit: Payer: Medicare Other | Admitting: Neurology

## 2014-12-01 ENCOUNTER — Ambulatory Visit (INDEPENDENT_AMBULATORY_CARE_PROVIDER_SITE_OTHER): Payer: Medicare Other | Admitting: Interventional Cardiology

## 2014-12-01 ENCOUNTER — Encounter: Payer: Self-pay | Admitting: Interventional Cardiology

## 2014-12-01 VITALS — Ht 62.0 in | Wt 144.1 lb

## 2014-12-01 DIAGNOSIS — R0602 Shortness of breath: Secondary | ICD-10-CM | POA: Diagnosis not present

## 2014-12-01 DIAGNOSIS — E785 Hyperlipidemia, unspecified: Secondary | ICD-10-CM

## 2014-12-01 DIAGNOSIS — I25119 Atherosclerotic heart disease of native coronary artery with unspecified angina pectoris: Secondary | ICD-10-CM | POA: Diagnosis not present

## 2014-12-01 DIAGNOSIS — R0789 Other chest pain: Secondary | ICD-10-CM

## 2014-12-01 MED ORDER — NITROGLYCERIN 0.4 MG/SPRAY TL SOLN: 1.0000 | Status: DC | PRN

## 2014-12-01 NOTE — Patient Instructions (Addendum)
**Note De-Identified  Obfuscation** Medication Instructions:  Same-no change  Labwork: None  Testing/Procedures: Your physician has requested that you have en exercise stress myoview. For further information please visit [email or web address]. Please follow instruction sheet, as given.   Follow-Up: Your physician recommends that you schedule a follow-up appointment in: to be determined

## 2014-12-01 NOTE — Progress Notes (Signed)
Patient ID: Briana Bowers, female   DOB: 10/14/1950, 64 y.o.   MRN: [id number]     Cardiology Office Note   Date:  12/01/2014   ID:  Briana Bowers, DOB Feb 02, 1951, MRN [id number]  PCP:  [name], MD    No chief complaint on file. f/u CAD, chest pain , low BP   Wt Readings from Last 3 Encounters:  12/01/14 144 lb 1.9 oz (65.372 kg)  07/16/14 145 lb (65.772 kg)  07/03/14 145 lb (65.772 kg)       History of Present Illness: Briana Bowers is a 64 y.o. female  who has coronary artery disease. She had a LAD stent placed in 2014. She has an RCA stent for many years ago. She had persistent chest discomfort after this most recent stent, prompting repeat catheterization. Her stents were found to be patent.   She feels the Plavix affects her stomach bd she wants to come off of this medicine.   She has stopped Crestor and tried a natural cholesterol medicine in 2014- TC increased to 250.  She does some exercise but is getting chest pain.  It is wirse with exercise.  She has left arm pain that is not exertional but occurs at night. Trying to increase stamina but it is only decreasing with age.  She reports that she has has low BP readings in the morning.  She has fatigue and dizziness associated with this.  It resolves within 1-2 hours.  She has not been taking bisoprolol regularly. If she feels palpitations, she may take a 1/4 tab of the bisoprolol.       Past Medical History  Diagnosis Date  . Arthritis     Degenerative joint disease  . CAD (coronary artery disease), native coronary artery 12/27/2012    RCA stent in the setting of non-ST elevation MI in 1999   . Gastroesophageal reflux disease 12/27/2012  . Anxiety disorder 12/27/2012  . Chronic fatigue 12/27/2012  . High cholesterol   . Myocardial infarction 08/02/1997  . Anginal pain     "just now": (12/27/2012)  . Pneumonia 1950, 1951, 1952  . History of blood transfusion     w/tonsillectomy; w/spinal fusion  . H/O hiatal  hernia   . Chronic lower back pain   . Chest pain, unspecified   . Other abnormal glucose     Past Surgical History  Procedure Laterality Date  . Partial hysterectomy  1974  . Appendectomy  1965  . Tonsillectomy  1964  . Cholecystectomy  08/1989  . Back surgery    . Ganglion cyst excision Right 1990's  . Ligament repair Right 1990's    "radial collateral" (12/27/2012)  . Hernia repair  02/2002    "ventral" (12/27/2012)  . Lumbar laminectomy  1989; 1990's    "twice" (12/27/2012)  . Posterior lumbar fusion  04/2002  . Bilateral salpingoophorectomy Bilateral 1978    "2 separate ORs; months apart" (12/27/2012)  . Dilation and curettage of uterus  09/1967    "after miscarriage" (12/27/2012)  . Mass excision Left 1990's    "groin" (12/27/2012)  . Tympanoplasty Bilateral 1966; 2001    "left; right" (12/27/2012)  . Nasal septum surgery  1969  . Finger arthroplasty Right 12/232011    "ring finger; fused it; put pin in it; 2nd joint" (12/27/2012)  . 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 Coronary angioplasty with stent placement  08/03/1997; 12/28/2012    1 + 1" (01/19/2013)  . Cardiac catheterization  2000's    "couple times" (12/27/2012)  . Left heart catheterization with coronary angiogram N/A 12/28/2012    Procedure: LEFT HEART CATHETERIZATION WITH CORONARY ANGIOGRAM;  Surgeon: Sinclair Grooms, MD;  Location: [facility] CATH LAB;  Service: Cardiovascular;  Laterality: N/A;  . Percutaneous coronary stent intervention (pci-s)  12/28/2012    Procedure: PERCUTANEOUS CORONARY STENT INTERVENTION (PCI-S);  Surgeon: Sinclair Grooms, MD;  Location[facility] C[facility] LAB;  Service: Cardiovascular;;  Prox LAD  . Fractional flow reserve wire  12/28/2012    Procedure: FRACTIONAL FLOW RESERVE WIRE;  Surgeon: Sinclair Grooms, MD;  Location: [facility] CATH LAB;  Service: Cardiovascular;;  PROX LAD  . Left heart catheterization with coronary angiogram N/A 01/20/2013     Procedure: LEFT HEART CATHETERIZATION WITH CORONARY ANGIOGRAM;  Surgeon: Blane Ohara, MD;  Location: [facility] CATH LAB;  Service: Cardiovascular;  Laterality: N/A;     Current Outpatient Prescriptions  Medication Sig Dispense Refill  . acidophilus (RISAQUAD) CAPS capsule Take 2 capsules by mouth daily.     Marland Kitchen albuterol (PROAIR HFA) 108 (90 BASE) MCG/ACT inhaler Inhale 2 puffs into the lungs every 6 (six) hours as needed for wheezing or shortness of breath.    Marland Kitchen aspirin EC 325 MG tablet Take 1 tablet (325 mg total) by mouth daily. 30 tablet 0  . b complex vitamins capsule Take 1 capsule by mouth daily.    . bisoprolol (ZEBETA) 10 MG tablet Take 10 mg by mouth daily.    . Budesonide-Formoterol Fumarate (SYMBICORT IN) Inhale 1 puff into the lungs 2 (two) times daily as needed (shortness of breath).    . Cholecalciferol (VITAMIN D3 PO) Take 1 tablet by mouth daily.    . Coenzyme Q10 (CO Q 10 PO) Take 1 tablet by mouth daily.    Marland Kitchen estrogen-methylTESTOSTERone (ESTRATEST) 1.25-2.5 MG per tablet Take 1 tablet by mouth at bedtime.    . fish oil-omega-3 fatty acids 1000 MG capsule Take 1 g by mouth daily.    . fluticasone (FLONASE) 50 MCG/ACT nasal spray Place 2 sprays into both nostrils daily as needed for allergies or rhinitis.    Marland Kitchen HYDROcodone-acetaminophen (NORCO/VICODIN) 5-325 MG per tablet Take 1 tablet by mouth every 6 (six) hours as needed for moderate pain. 15 tablet 0  . metaxalone (SKELAXIN) 800 MG tablet Take 400-800 mg by mouth 2 (two) times daily as needed for muscle spasms.     . nitroGLYCERIN (NITROLINGUAL) 0.4 MG/SPRAY spray Place 1 spray under the tongue every 5 (five) minutes as needed for chest pain. 12 g 12  . rosuvastatin (CRESTOR) 20 MG tablet Take 20 mg by mouth at bedtime.    . rosuvastatin (CRESTOR) 20 MG tablet Take 20 mg by mouth.    . TURMERIC PO Take 800 mg by mouth 2 (two) times daily.      No current facility-administered medications for this visit.    Allergies:    Atorvastatin; Ciprofloxacin; Levaquin; and Dilaudid    Social History:  The patient  reports that she quit smoking about 32 years ago. Her smoking use included Cigarettes. She has a 17 pack-year smoking history. She has never used smokeless tobacco. She reports that she does not drink alcohol or use illicit drugs.   Family History:  The patient's family history includes Coronary artery disease in her father and mother; Other in her daughter and son.    ROS:  Please see the history of present illness.   Otherwise, review of systems are positive for arm pain, not related to exertion, low BP.   All other systems are reviewed and negative.    PHYSICAL EXAM: VS:  Ht 5\' 2"  (1.575 m)  Wt 144 lb 1.9 oz (65.372 kg)  BMI 26.35 kg/m2 , BMI Body mass index is 26.35 kg/(m^2). GEN: Well nourished, well developed, in no acute distress HEENT: normal Neck: no JVD, carotid bruits, or masses Cardiac: RRR; no murmurs, rubs, or gallops,no edema  Respiratory:  clear to auscultation bilaterally, normal work of breathing GI: soft, nontender, nondistended, + BS MS: no deformity or atrophy Skin: warm and dry, no rash Neuro:  Strength and sensation are intact Psych: euthymic mood, full affect   EKG:   The ekg ordered today demonstrates    Recent Labs: No results found for requested labs within last 365 days.   Lipid Panel    Component Value Date/Time   CHOL 136 01/20/2013 0540   TRIG 85 01/20/2013 0540   HDL 38* 01/20/2013 0540   CHOLHDL 3.6 01/20/2013 0540   VLDL 17 01/20/2013 0540   LDLCALC 81 01/20/2013 0540     Other studies Reviewed: Additional studies/ records that were reviewed today with results demonstrating: Prior stress from 2005; cath from 2014-patent stents in the LAD and RCA.  NSR, NSST in the anterior leads, anterior Q waves  ASSESSMENT AND PLAN:  1. CAD: Mild anginal sx with exercise.  Plan for Cardiolite.   2. Hypotension: Holding Bisoprolol.  Encourage hydration.  SHe can  liberalize salt intake.  3. Hyperlipidemia: Continue Crestor.  LDL target 70.   Current medicines are reviewed at length with the patient today.  The patient concerns regarding her medicines were addressed.  The following changes have been made:  No change called in SL NTG  Labs/ tests ordered today include:   Orders Placed This Encounter  Procedures  . Myocardial Perfusion Imaging  . EKG 12-Lead    Recommend 150 minutes/week of aerobic exercise Low fat, low carb, high fiber diet recommended  Disposition:   FU for stress test   Teresita Madura., MD  12/01/2014 5:37 PM    [facility] [address], [place], [place]  [address] Phone: [phone]; Fax: [phone]

## 2014-12-04 ENCOUNTER — Telehealth (HOSPITAL_COMMUNITY): Payer: Self-pay

## 2014-12-04 NOTE — Telephone Encounter (Signed)
Patient given detailed instructions per Myocardial Perfusion Study Information Sheet for test on 12-06-2014 at 0745. Patient Notified to arrive 15 minutes early, and that it is imperative to arrive on time for appointment to keep from having the test rescheduled. Patient verbalized understanding. Briana Bowers, Kenrick Pore A

## 2014-12-06 ENCOUNTER — Ambulatory Visit (HOSPITAL_COMMUNITY): Payer: Medicare Other | Attending: Cardiology

## 2014-12-06 DIAGNOSIS — R0789 Other chest pain: Secondary | ICD-10-CM | POA: Diagnosis present

## 2014-12-06 DIAGNOSIS — R0609 Other forms of dyspnea: Secondary | ICD-10-CM | POA: Insufficient documentation

## 2014-12-06 DIAGNOSIS — R5383 Other fatigue: Secondary | ICD-10-CM | POA: Diagnosis not present

## 2014-12-06 DIAGNOSIS — R002 Palpitations: Secondary | ICD-10-CM | POA: Diagnosis not present

## 2014-12-06 DIAGNOSIS — R0602 Shortness of breath: Secondary | ICD-10-CM | POA: Insufficient documentation

## 2014-12-06 DIAGNOSIS — R42 Dizziness and giddiness: Secondary | ICD-10-CM | POA: Insufficient documentation

## 2014-12-06 MED ORDER — TECHNETIUM TC 99M SESTAMIBI GENERIC - CARDIOLITE
31.5000 | Freq: Once | INTRAVENOUS | Status: AC | PRN
  Administered 2014-12-06: 32 via INTRAVENOUS

## 2014-12-06 MED ORDER — TECHNETIUM TC 99M SESTAMIBI GENERIC - CARDIOLITE
10.7000 | Freq: Once | INTRAVENOUS | Status: AC | PRN
  Administered 2014-12-06: 11 via INTRAVENOUS

## 2014-12-07 LAB — MYOCARDIAL PERFUSION IMAGING
CHL CUP MPHR: 154 {beats}/min
CHL CUP NUCLEAR SRS: 3
CHL CUP NUCLEAR SSS: 9
CSEPED: 8 min
CSEPHR: 94 %
CSEPPHR: 146 {beats}/min
Estimated workload: 10.1 METS
Exercise duration (sec): 0 s
LHR: 0.32
LV dias vol: 53 mL
LVSYSVOL: 16 mL
NUC STRESS TID: 0.86
Rest HR: 58 {beats}/min
SDS: 7

## 2014-12-08 ENCOUNTER — Telehealth: Payer: Self-pay | Admitting: Interventional Cardiology

## 2014-12-08 NOTE — Telephone Encounter (Signed)
New problem   Pt returning your call. Please call pt.

## 2014-12-08 NOTE — Telephone Encounter (Signed)
Informed pt of stress test results. Pt verbalized understanding. 

## 2015-03-02 ENCOUNTER — Telehealth: Payer: Self-pay | Admitting: Interventional Cardiology

## 2015-03-02 NOTE — Telephone Encounter (Signed)
Consulted Brittiany Simmons PA (FLEX) about patient's situation. She advised if patient was having similar symptoms to what she felt when patient had her heart cath. and having chest pain, that she should be evaluated at the ED. Patient states she is not sure if her symptoms are the same, but her chest pain comes and goes. Patient symptoms do not change with exertion.  Informed patient of Brittiany's advise, but patient is not interested in going to ED and refusing to go. Patient states that they are not going to do anything for her BP. Informed patient that pushing fluids would help her BP. Patient states she has to drink 10 gallons of water and Gatorade to get BP up. Patient is tired of drinking a lot of fluids.  Informed patient that Dr. Irish Lack will still advise on this matter, but he is not in the office today, so it might not be until the beginning of next week. Patient stated she was fine with that. Patient also stated that she was going to call her PCP.

## 2015-03-02 NOTE — Telephone Encounter (Signed)
Pt c/o BP issue: STAT if pt c/o blurred vision, one-sided weakness or slurred speech  1. What are your last 5 BP readings? 85/42, 96/47, 88/48, 89/51, 92/49, 77/45, 88/47  2. Are you having any other symptoms (ex. Dizziness, headache, blurred vision, passed out)? Tired all the time and feels like her arms are going to rupture because they are hurting so bad  3. What is your BP issue?

## 2015-03-02 NOTE — Telephone Encounter (Signed)
Called patient back about her low BP. Patient has not been taking her Zebeta. Patient stated her heart rate is between 60-100. The BP readings are from this past week and taken daily in the evening. Patient also stated she will wake up during the night with her arms burning and hurting, like they are going to rupture. Informed patient that this message would be sent to Dr. Irish Lack for advisement.

## 2015-03-05 NOTE — Telephone Encounter (Signed)
Can liberalize salt intake as well as increasing fluid intake.Marland Kitchen

## 2015-03-05 NOTE — Telephone Encounter (Signed)
The pt is advised and she verbalized understanding. She is requesting a f/u with Dr Irish Lack and only Dr Irish Lack, not an APP, and she refuses to go to the ER/Urgent care. I advised her that the soonest available appt with Dr Irish Lack is not until 11/29 at 10:15 and she accepted that appointment. She is advised to call 911 or have someone drive her to the closest ER/Urgent Care if her arm pain worsens and/or if her BP drops and she feels dizzy, light headed or passes out. She verbalized understanding and is in agreement with plan.

## 2015-03-13 ENCOUNTER — Ambulatory Visit: Payer: Medicare Other | Admitting: Interventional Cardiology

## 2015-07-10 DIAGNOSIS — M654 Radial styloid tenosynovitis [de Quervain]: Secondary | ICD-10-CM | POA: Diagnosis not present

## 2015-08-07 DIAGNOSIS — M19041 Primary osteoarthritis, right hand: Secondary | ICD-10-CM | POA: Diagnosis not present

## 2015-08-07 DIAGNOSIS — M654 Radial styloid tenosynovitis [de Quervain]: Secondary | ICD-10-CM | POA: Diagnosis not present

## 2015-08-31 DIAGNOSIS — M654 Radial styloid tenosynovitis [de Quervain]: Secondary | ICD-10-CM | POA: Diagnosis not present

## 2015-08-31 DIAGNOSIS — M19041 Primary osteoarthritis, right hand: Secondary | ICD-10-CM | POA: Diagnosis not present

## 2015-09-05 DIAGNOSIS — N951 Menopausal and female climacteric states: Secondary | ICD-10-CM | POA: Diagnosis not present

## 2015-09-05 DIAGNOSIS — E782 Mixed hyperlipidemia: Secondary | ICD-10-CM | POA: Diagnosis not present

## 2015-09-05 DIAGNOSIS — R7309 Other abnormal glucose: Secondary | ICD-10-CM | POA: Diagnosis not present

## 2015-09-06 DIAGNOSIS — N951 Menopausal and female climacteric states: Secondary | ICD-10-CM | POA: Diagnosis not present

## 2015-09-06 DIAGNOSIS — R7303 Prediabetes: Secondary | ICD-10-CM | POA: Diagnosis not present

## 2015-09-06 DIAGNOSIS — E782 Mixed hyperlipidemia: Secondary | ICD-10-CM | POA: Diagnosis not present

## 2015-09-19 DIAGNOSIS — M654 Radial styloid tenosynovitis [de Quervain]: Secondary | ICD-10-CM | POA: Diagnosis not present

## 2015-10-12 DIAGNOSIS — H04123 Dry eye syndrome of bilateral lacrimal glands: Secondary | ICD-10-CM | POA: Diagnosis not present

## 2015-10-12 DIAGNOSIS — H25813 Combined forms of age-related cataract, bilateral: Secondary | ICD-10-CM | POA: Diagnosis not present

## 2015-10-12 DIAGNOSIS — H16203 Unspecified keratoconjunctivitis, bilateral: Secondary | ICD-10-CM | POA: Diagnosis not present

## 2015-10-12 DIAGNOSIS — H1789 Other corneal scars and opacities: Secondary | ICD-10-CM | POA: Diagnosis not present

## 2015-11-09 DIAGNOSIS — L03116 Cellulitis of left lower limb: Secondary | ICD-10-CM | POA: Diagnosis not present

## 2015-12-27 ENCOUNTER — Ambulatory Visit: Payer: Medicare Other | Admitting: Physician Assistant

## 2015-12-28 ENCOUNTER — Other Ambulatory Visit: Payer: Self-pay | Admitting: Family Medicine

## 2015-12-28 DIAGNOSIS — Z1231 Encounter for screening mammogram for malignant neoplasm of breast: Secondary | ICD-10-CM

## 2016-01-01 ENCOUNTER — Encounter: Payer: Self-pay | Admitting: Physician Assistant

## 2016-01-01 NOTE — Progress Notes (Signed)
Cardiology Office Note    Date:  01/02/2016  ID:  Briana Bowers, DOB 11-Sep-1950, MRN [id number] PCP:  [name], MD  Cardiologist: Irish Lack  Chief Complaint: BP issues  History of Present Illness:  Briana Bowers is a 65 y.o. female with history of CAD (NSTEMI/RCA stent 1999, LAD stent 2014), GERD, anxiety, arthritis, hyperlipidemia, chronic fatigue, hypotension who presents for f/u. At last OV 11/2014 was noting some chest pain with exercise as well as low BP readings in the AM. Bisoprolol was held. Nuc 12/06/14 was normal, EF >65%. Last echo 2010 EF 60-65%, mild MR. Last labs 2015 with Cr 0.71, Hgb 14.5.  She presents back to clinic today for evaluation of low BP. This has been ongoing for 3 years. She tends to have low normal BP during the day, then it drops at night as low as 71/49 one time. It sometimes drops during the day as well. She has no way to predict this is happening. When it does, she feels a sensation of chest pressure and arm heaviness. She has to get up and drink a jug of water, lick salt, and do arm exercises when this happens to bring her BP up which helps her symptoms resolve. She does the [place] daily for exercise and denies any exertional chest pain. No SOB, syncope, bleeding reported. She has chronic intermittent nausea. She has chronic fatigue as well. She reports being on steroids very remotely for several months for asthma. Denies any recent steroid use. The highest BP she has seen is 124/58 after liberalizing salt intake.  She self-reduced Crestor to 10mg  daily - is not a big fan of medication in general.  Past Medical History:  Diagnosis Date  . Anxiety disorder 12/27/2012  . Arthritis    Degenerative joint disease  . CAD (coronary artery disease), native coronary artery 12/27/2012   a. NSTEMI/RCA stent 1999. b. LAD stent 2014. c. Neg nuc 11/2014.  Marland Kitchen Chronic fatigue 12/27/2012  . Chronic lower back pain   . Gastroesophageal reflux disease 12/27/2012  . H/O  hiatal hernia   . History of blood transfusion    w/tonsillectomy; w/spinal fusion  . Hyperlipidemia   . Hypotension    a. prompting holding of BB.  Marland Kitchen Myocardial infarction [facility]) 08/02/1997    Past Surgical History:  Procedure Laterality Date  . APPENDECTOMY  1965  . BACK SURGERY    . BILATERAL SALPINGOOPHORECTOMY Bilateral 1978   "2 separate ORs; months apart" (12/27/2012)  . CARDIAC CATHETERIZATION  2000's   "couple times" (12/27/2012)  . CHOLECYSTECTOMY  08/1989  . CORONARY ANGIOPLASTY WITH STENT PLACEMENT  08/03/1997; 12/28/2012   1 + 1" (01/19/2013)  . DILATION AND CURETTAGE OF UTERUS  09/1967   "after miscarriage" (12/27/2012)  . FINGER ARTHROPLASTY Right 12/232011   "ring finger; fused it; put pin in it; 2nd joint" (12/27/2012)  . FRACTIONAL FLOW RESERVE WIRE  12/28/2012   Procedure: FRACTIONAL FLOW RESERVE WIRE;  Surgeon: Sinclair Grooms, MD;  Location: [facility] CATH LAB;  Service: Cardiovascular;;  PROX LAD  . GANGLION CYST EXCISION Right 1990's  . 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 HERNIA REPAIR  02/2002   "ventral" (12/27/2012)  . LEFT HEART CATHETERIZATION WITH CORONARY ANGIOGRAM N/A 12/28/2012   Procedure: LEFT HEART CATHETERIZATION WITH CORONARY ANGIOGRAM;  Surgeon: Sinclair Grooms, MD;  Location: [facility] CATH LAB;  Service: Cardiovascular;  Laterality: N/A;  .  LEFT HEART CATHETERIZATION WITH CORONARY ANGIOGRAM N/A 01/20/2013   Procedure: LEFT HEART CATHETERIZATION WITH CORONARY ANGIOGRAM;  Surgeon: Blane Ohara, MD;  Location: [facility] CATH LAB;  Service: Cardiovascular;  Laterality: N/A;  . LIGAMENT REPAIR Right 1990's   "radial collateral" (12/27/2012)  . [place]; 1990's   "twice" (12/27/2012)  . MASS EXCISION Left 1990's   "groin" (12/27/2012)  . NASAL SEPTUM SURGERY  1969  . PARTIAL HYSTERECTOMY  1974  . PERCUTANEOUS CORONARY STENT INTERVENTION (PCI-S)  12/28/2012   Procedure: PERCUTANEOUS  CORONARY STENT INTERVENTION (PCI-S);  Surgeon: Sinclair Grooms, MD;  Location: [facility] CATH LAB;  Service: Cardiovascular;;  Prox LAD  . POSTERIOR LUMBAR FUSION  04/2002  . TONSILLECTOMY  1964  . TYMPANOPLASTY Bilateral 1966; 2001   "left; right" (12/27/2012)    Current Medications: Current Outpatient Prescriptions  Medication Sig Dispense Refill  . acidophilus (RISAQUAD) CAPS capsule Take 2 capsules by mouth daily.     Marland Kitchen albuterol (PROAIR HFA) 108 (90 BASE) MCG/ACT inhaler Inhale 2 puffs into the lungs every 6 (six) hours as needed for wheezing or shortness of breath.    Marland Kitchen aspirin EC 325 MG tablet Take 1 tablet (325 mg total) by mouth daily. 30 tablet 0  . b complex vitamins capsule Take 1 capsule by mouth daily.    . Budesonide-Formoterol Fumarate (SYMBICORT IN) Inhale 1 puff into the lungs 2 (two) times daily as needed (shortness of breath).    . Cholecalciferol (VITAMIN D3 PO) Take 1 tablet by mouth daily.    . Coenzyme Q10 (CO Q 10 PO) Take 1 tablet by mouth daily.    Marland Kitchen estrogen-methylTESTOSTERone (ESTRATEST) 1.25-2.5 MG per tablet Take 1 tablet by mouth at bedtime.    . fish oil-omega-3 fatty acids 1000 MG capsule Take 1 g by mouth daily.    . fluticasone (FLONASE) 50 MCG/ACT nasal spray Place 2 sprays into both nostrils daily as needed for allergies or rhinitis.    Marland Kitchen HYDROcodone-acetaminophen (NORCO/VICODIN) 5-325 MG per tablet Take 1 tablet by mouth every 6 (six) hours as needed for moderate pain. 15 tablet 0  . metaxalone (SKELAXIN) 800 MG tablet Take 400-800 mg by mouth 2 (two) times daily as needed for muscle spasms.     . pantoprazole (PROTONIX) 40 MG tablet Take 40 mg by mouth 2 (two) times daily.    . rosuvastatin (CRESTOR) 20 MG tablet Take 20 mg by mouth.     No current facility-administered medications for this visit.      Allergies:   Atorvastatin; Ciprofloxacin; Levaquin [levofloxacin in d5w]; and Dilaudid [hydromorphone hcl]   Social History   Social History  . Marital  status: Married    Spouse name: N/A  . Number of children: N/A  . Years of education: N/A   Social History Main Topics  . Smoking status: Former Smoker    Packs/day: 1.00    Years: 17.00    Types: Cigarettes    Quit date: 12/27/1981  . Smokeless tobacco: Never Used  . Alcohol use No  . Drug use: No  . Sexual activity: Yes   Other Topics Concern  . None   Social History Narrative   She lives with husband, Grayland Ormond.     She was on disability due to chronic back pain           Family History:  The patient's family history includes Coronary artery disease in her father and mother; Other in her daughter and son.  ROS:   Please see the history of present illness.  All other systems are reviewed and otherwise negative.    PHYSICAL EXAM:   VS:  BP 90/62   Pulse 63   Ht 5' 2.25" (1.581 m)   Wt 143 lb (64.9 kg)   SpO2 95%   BMI 25.95 kg/m   BMI: Body mass index is 25.95 kg/m. GEN: Well nourished, well developed WF, in no acute distress  HEENT: normocephalic, atraumatic Neck: no JVD, carotid bruits, or masses Cardiac: RRR; no murmurs, rubs, or gallops, no edema  Respiratory:  clear to auscultation bilaterally, normal work of breathing GI: soft, nontender, nondistended, + BS MS: no deformity or atrophy  Skin: warm and dry, no rash Neuro:  Alert and Oriented x 3, Strength and sensation are intact, follows commands Psych: euthymic mood, full affect  Wt Readings from Last 3 Encounters:  01/02/16 143 lb (64.9 kg)  12/06/14 144 lb (65.3 kg)  12/01/14 144 lb 1.9 oz (65.4 kg)      Studies/Labs Reviewed:   EKG:  EKG was ordered today and personally reviewed by me and demonstrates NSR 63bpm, incomplete RBBB, QT c 410ms - no sig change  Recent Labs: No results found for requested labs within last 8760 hours.   Lipid Panel    Component Value Date/Time   CHOL 136 01/20/2013 0540   TRIG 85 01/20/2013 0540   HDL 38 (L) 01/20/2013 0540   CHOLHDL 3.6 01/20/2013 0540   VLDL  17 01/20/2013 0540   LDLCALC 81 01/20/2013 0540    Additional studies/ records that were reviewed today include: Summarized above.    ASSESSMENT & PLAN:   1. Hypotension - symptoms are suspicious for adrenal insufficiency. Will have her return tomorrow AM for 8am plasma ACTH and serum cortisol level along with ACTH stim test. At that time will also check CMET, CBC, TSH to exclude other obvious metabolic abnormality. (ADDENDUM: we researched the ACTH stim test further and it turns out our lab does not perform them in our office. We contacted [facility] for assistance and they have the necessary Cosyntropin and lab protocol available to do this. They are also willing to draw the other labs as requested as well at the same time to avoid multiple sticks. We have called and left a message for the patient to call us back so we can inform her to go to [facility] in [place] for the test.) Would consider placing her on midodrine if adrenal parameters are normal. Note she does express hesitancy to begin any new meds. She already liberalizes salt intake. 2. CAD in native artery - she reports chest discomfort and arm discomfort when her BP drops. I suspect the hypotension is the culprit as she does not have symptoms when her BP is normal or when she exerts herself. We reviewed that she may be able to reduce her aspirin to 81mg  daily given recent guidelines. She is hesitant to do so. I advised she can discuss further with Dr. Irish Lack. We reviewed warning symptoms of angina. May need to consider further eval of progressive CAD if w/u for adrenal insufficiency is unremarkable. 3. Hyperlipidemia - she wishes to have lipids checked with her labs since she self-reduced her Crestor to 10mg  daily.  Disposition: F/u with Dr. Irish Lack as scheduled 02/2016.   Medication Adjustments/Labs and Tests Ordered: Current medicines are reviewed at length with the patient today.  Concerns  regarding medicines are outlined above. Medication changes, Labs and Tests  ordered today are summarized above and listed in the Patient Instructions accessible in Encounters.   Raechel Ache PA-C  01/02/2016 10:17 AM    [address], [place], [place]  [address] Phone: [phone]; Fax: [phone]

## 2016-01-02 ENCOUNTER — Ambulatory Visit (INDEPENDENT_AMBULATORY_CARE_PROVIDER_SITE_OTHER): Payer: PPO | Admitting: Physician Assistant

## 2016-01-02 ENCOUNTER — Encounter: Payer: Self-pay | Admitting: Physician Assistant

## 2016-01-02 VITALS — BP 90/62 | HR 63 | Ht 62.25 in | Wt 143.0 lb

## 2016-01-02 DIAGNOSIS — E785 Hyperlipidemia, unspecified: Secondary | ICD-10-CM | POA: Diagnosis not present

## 2016-01-02 DIAGNOSIS — I959 Hypotension, unspecified: Secondary | ICD-10-CM

## 2016-01-02 DIAGNOSIS — I2510 Atherosclerotic heart disease of native coronary artery without angina pectoris: Secondary | ICD-10-CM

## 2016-01-02 NOTE — Patient Instructions (Signed)
Medication Instructions: Continue same medications   Labwork:Tomorrow 01/03/16 Fasting at 8:00 am ( cbc,tsh,lipid panel,serum cortisol,plasma acth,stim test,basal )   Testing/Procedures:   Follow-Up:Dr.Varanasi Monday 02/18/16 at 1:45 pm   Any Other Special Instructions Will Be Listed Below (If Applicable).     If you need a refill on your cardiac medications before your next appointment, please call your pharmacy.

## 2016-01-03 ENCOUNTER — Other Ambulatory Visit: Payer: PPO

## 2016-01-03 DIAGNOSIS — I2510 Atherosclerotic heart disease of native coronary artery without angina pectoris: Secondary | ICD-10-CM

## 2016-01-03 DIAGNOSIS — E785 Hyperlipidemia, unspecified: Secondary | ICD-10-CM

## 2016-01-03 DIAGNOSIS — I959 Hypotension, unspecified: Secondary | ICD-10-CM | POA: Diagnosis not present

## 2016-01-03 MED ORDER — COSYNTROPIN NICU IV SYRINGE 0.25 MG/ML (STANDARD DOSE)
0.2500 mg | Freq: Once | INTRAVENOUS | Status: AC
  Administered 2016-01-03: 0.25 mg via INTRAMUSCULAR

## 2016-01-04 LAB — CBC WITH DIFFERENTIAL/PLATELET
BASOS ABS: 0 {cells}/uL (ref 0–200)
BASOS PCT: 0 %
Eosinophils Absolute: 207 cells/uL (ref 15–500)
Eosinophils Relative: 3 %
HCT: 41.2 % (ref 35.0–45.0)
Hemoglobin: 13.2 g/dL (ref 11.7–15.5)
LYMPHS PCT: 38 %
Lymphs Abs: 2622 cells/uL (ref 850–3900)
MCH: 27.7 pg (ref 27.0–33.0)
MCHC: 32 g/dL (ref 32.0–36.0)
MCV: 86.6 fL (ref 80.0–100.0)
MONOS PCT: 7 %
MPV: 9.2 fL (ref 7.5–12.5)
Monocytes Absolute: 483 cells/uL (ref 200–950)
Neutro Abs: 3588 cells/uL (ref 1500–7800)
Neutrophils Relative %: 52 %
PLATELETS: 251 10*3/uL (ref 140–400)
RBC: 4.76 MIL/uL (ref 3.80–5.10)
RDW: 15.6 % — ABNORMAL HIGH (ref 11.0–15.0)
WBC: 6.9 10*3/uL (ref 3.8–10.8)

## 2016-01-04 LAB — TSH: TSH: 4.44 mIU/L

## 2016-01-04 LAB — LIPID PANEL
Cholesterol: 139 mg/dL (ref 125–200)
HDL: 53 mg/dL (ref >=46)
LDL Cholesterol: 74 mg/dL (ref <130)
TRIGLYCERIDES: 61 mg/dL (ref <150)
Total CHOL/HDL Ratio: 2.6 Ratio (ref <=5.0)
VLDL: 12 mg/dL (ref <30)

## 2016-01-04 LAB — COMPREHENSIVE METABOLIC PANEL
ALT: 21 U/L (ref 6–29)
AST: 21 U/L (ref 10–35)
Albumin: 4.2 g/dL (ref 3.6–5.1)
Alkaline Phosphatase: 39 U/L (ref 33–130)
BUN: 13 mg/dL (ref 7–25)
CHLORIDE: 106 mmol/L (ref 98–110)
CO2: 23 mmol/L (ref 20–31)
Calcium: 9.1 mg/dL (ref 8.6–10.4)
Creat: 0.73 mg/dL (ref 0.50–0.99)
Glucose, Bld: 93 mg/dL (ref 65–99)
POTASSIUM: 4.3 mmol/L (ref 3.5–5.3)
Sodium: 142 mmol/L (ref 135–146)
TOTAL PROTEIN: 6.8 g/dL (ref 6.1–8.1)
Total Bilirubin: 0.5 mg/dL (ref 0.2–1.2)

## 2016-01-04 LAB — CORTISOL
CORTISOL PLASMA: 23.8 ug/dL — AB
Cortisol, Plasma: 16.2 ug/dL
Cortisol, Plasma: 27.3 ug/dL — ABNORMAL HIGH

## 2016-01-07 LAB — ACTH: C206 ACTH: 29 pg/mL (ref 6–50)

## 2016-01-08 ENCOUNTER — Ambulatory Visit
Admission: RE | Admit: 2016-01-08 | Discharge: 2016-01-08 | Disposition: A | Discharge status: Home / Self Care | Payer: PPO | Source: Ambulatory Visit | Attending: Family Medicine | Admitting: Family Medicine

## 2016-01-08 DIAGNOSIS — Z1231 Encounter for screening mammogram for malignant neoplasm of breast: Secondary | ICD-10-CM

## 2016-01-09 ENCOUNTER — Telehealth: Payer: Self-pay | Admitting: Physician Assistant

## 2016-01-09 DIAGNOSIS — I2510 Atherosclerotic heart disease of native coronary artery without angina pectoris: Secondary | ICD-10-CM

## 2016-01-09 NOTE — Telephone Encounter (Signed)
I think a monitor is reasonable. Can obtain a 48-hour Holter to r/o arrhythmia causing her hypotension although she was not describing symptoms of palpitations at the time. This issue has been ongoing for 3 years and it's not clear that there is a cardiac cause.  I also sent a message to Dr. Irish Lack to get his thoughts on whether or not we need to pursue further ischemic evaluation. He did a nuc on her last year for same issue. She exercises daily on the [place] without any angina.  Awaiting message back.   Retina Bernardy PA-C

## 2016-01-09 NOTE — Telephone Encounter (Signed)
Returned call to pt. Pt is real adamant about getting a heart monitor to see why her bp keeps dropping so low.  Dayna sent Dr. Irish Lack a note re: Cortisol and asking for advice. Pt has been made aware of this, but insist on a heart monitor, and even stated to me that if we weren't going to order one, not to even bother calling her back. Pt very frustrated and states that this has been going on for 3 years now and she wants an answer.  I advised pt that I would send Dayna and Dr. Irish Lack a message to get advice.  Pt verbalized appreciation.

## 2016-01-09 NOTE — Telephone Encounter (Signed)
New message    Patient had test done last week  - it was negative.  Pt c/o BP issue: STAT if pt c/o blurred vision, one-sided weakness or slurred speech  1. What are your last 5 BP readings? Last night 89/45   2. Are you having any other symptoms (ex. Dizziness, headache, blurred vision, passed out)?  No   3. What is your BP issue? Wants to discuss with APP about heart monitors due to blood pressure drop at night.

## 2016-01-09 NOTE — Telephone Encounter (Signed)
Called pt to let her know that Melina Copa, PA-C, agreed to order a 48 holter monitor for her.  She has been advised that I will put in an order and someone from [facility] dept will call her to get this scheduled.  Pt agreeable with this plan.

## 2016-01-10 ENCOUNTER — Telehealth: Payer: Self-pay | Admitting: Interventional Cardiology

## 2016-01-10 NOTE — Telephone Encounter (Signed)
Returned pts' call.  Left a message on both, cell and home phones for pt to return my call.

## 2016-01-10 NOTE — Telephone Encounter (Signed)
Please let patient know there are 2 other options aside from the outpatient heart monitor for monitoring her heart rhythm.  1) If she has a smart-phone there is an app with a device that you can order from https://www.[employer]/. We are not affiliated with this company but several of our patients have used this device successfully to capture what their heart rhythm is doing when they are having symptoms. We can then review at her follow-up visit.  2) If she is still feeling bad she can present to the ER for admission to the hospital and overnight telemetry and blood pressure monitoring. Be forewarned though the telemetry box that they use is similar to the event monitor but she will not have to manage it herself as it is tended to by the nursing staff.  Gee Habig PA-C

## 2016-01-10 NOTE — Telephone Encounter (Signed)
New Message  Pt voiced 48 hour heart monitor is too much for her strength, she is not able to do it, she'd rather die, and it's an insult to her intelligence.   Please f/u with pt please.

## 2016-01-11 NOTE — Telephone Encounter (Signed)
Noted.   Per my prior phone note, the patient called in demanding a heart monitor and as I had said, I thought this was a reasonable request to further evaluate her hypotension. She can follow up with Dr. Irish Lack as planned if she wishes - has appt in 02/2016.  Dayna Dunn PA-C

## 2016-01-11 NOTE — Telephone Encounter (Signed)
Follow Up   Briana Bowers is returning your call

## 2016-01-11 NOTE — Telephone Encounter (Signed)
Called pt back.  Pt stated that a 48 hour monitor was an insult to her and she feels like it was ordered just to appease her.  Pt was very rude, stated that she wold rather not have anything to do with the [place] anymore, that she will just do what she can do at home.  I advised the pt, after she was getting loud and rude, that I would let Melina Copa, PA-C know that pt changed her mind and I was not going to continue with this conversation  Pt stated that's right, and hund up.

## 2016-02-06 ENCOUNTER — Telehealth: Payer: Self-pay | Admitting: Interventional Cardiology

## 2016-02-06 NOTE — Telephone Encounter (Addendum)
We do not treat adrenal gland problems in our office. We work this up for patients with low blood pressure, but upon identifying an issue, the patient needs to see an endocrinologist as we do not manage this issue. If she was told by someone that there is in fact a problem with her adrenal gland, she should see an endocrinologist.  I would recommend given her previously perceived negative experience with me that she would be better suited to schedule follow up with her regular cardiologist. Alternatively, after review of our protocol with another colleague, we could also offer for her to schedule an appointment with another cardiologist in the community for a second opinion.  Nakeeta Sebastiani PA-C

## 2016-02-06 NOTE — Telephone Encounter (Signed)
Returned pts call.   1st off, pt wanted to apologize to me and Melina Copa, PA-C on how the whole "office visit and follow-up" went back in September.  Pt has been told by someone else that something is wrong with her Adrenal Glands, and she said Dayna Dunn-PA-C was the 1st medical provider tom mention to her, and if she feels she could help her, she would like to see her ASAP.  I reminded the pt that she had told us previously that she was done with our practice, and she even cancelled her appts and I would have to send Melina Copa, PA-C a message to see if she was willing to see her again.  Pt went on to say, "That office owes me apologies too", and I will make sure to tell her that.  Please advise!

## 2016-02-06 NOTE — Telephone Encounter (Signed)
New message   Pt verbalized that she wants the rn to call her back about lab results

## 2016-02-18 ENCOUNTER — Ambulatory Visit: Payer: Medicare Other | Admitting: Interventional Cardiology

## 2016-02-25 NOTE — Telephone Encounter (Signed)
There have been multiple behind the scenes discussions about this situation. Dr. Irish Lack does not feel it is fair for the patient to be able to pick and choose who she will see and won't see (has previously fired Dr. Tamala Julian). We reviewed this informaiton with practice administration given her erratic behavior. Please call patient - please review the information below regarding endocrinology referral, but also let her know I am willing to see her back in follow-up if needed to discuss empiric trial of medications to help with her low blood pressure. If she decides she does not wish to follow up with our practice as previously stated, she is also welcome to seek care for a second opinion at an alternative practice like Dr. Wynonia Lawman - regardless though it is important that she follow up with cardiology longterm given her h/o CAD. Tilford Deaton PA-C

## 2016-02-25 NOTE — Telephone Encounter (Signed)
Called pt, per Melina Copa, PA-C, after carefully considering whether or not to see pt back in office.  Pt has been advised that Dayna is willing to see her back in follow-up if needed to discuss empiric trial of medications to help with her low blood pressure, but she could not treat Adrenal Insufficiency.  Pt agreeable with the plan, but states that she is going out of town and she will call when she gets back.

## 2016-03-12 DIAGNOSIS — E042 Nontoxic multinodular goiter: Secondary | ICD-10-CM | POA: Diagnosis not present

## 2016-03-12 DIAGNOSIS — I2510 Atherosclerotic heart disease of native coronary artery without angina pectoris: Secondary | ICD-10-CM | POA: Diagnosis not present

## 2016-03-12 DIAGNOSIS — K219 Gastro-esophageal reflux disease without esophagitis: Secondary | ICD-10-CM | POA: Diagnosis not present

## 2016-03-12 DIAGNOSIS — R7309 Other abnormal glucose: Secondary | ICD-10-CM | POA: Diagnosis not present

## 2016-03-12 DIAGNOSIS — N951 Menopausal and female climacteric states: Secondary | ICD-10-CM | POA: Diagnosis not present

## 2016-03-12 DIAGNOSIS — E782 Mixed hyperlipidemia: Secondary | ICD-10-CM | POA: Diagnosis not present

## 2016-03-18 ENCOUNTER — Other Ambulatory Visit: Payer: Self-pay | Admitting: Family Medicine

## 2016-03-18 DIAGNOSIS — E049 Nontoxic goiter, unspecified: Secondary | ICD-10-CM

## 2016-04-03 DIAGNOSIS — L309 Dermatitis, unspecified: Secondary | ICD-10-CM | POA: Diagnosis not present

## 2016-05-13 DIAGNOSIS — Z955 Presence of coronary angioplasty implant and graft: Secondary | ICD-10-CM | POA: Diagnosis not present

## 2016-05-13 DIAGNOSIS — I950 Idiopathic hypotension: Secondary | ICD-10-CM | POA: Diagnosis not present

## 2016-05-13 DIAGNOSIS — I252 Old myocardial infarction: Secondary | ICD-10-CM | POA: Diagnosis not present

## 2016-05-13 DIAGNOSIS — I2510 Atherosclerotic heart disease of native coronary artery without angina pectoris: Secondary | ICD-10-CM | POA: Diagnosis not present

## 2016-05-13 HISTORY — DX: Idiopathic hypotension: I95.0

## 2016-06-03 DIAGNOSIS — I2510 Atherosclerotic heart disease of native coronary artery without angina pectoris: Secondary | ICD-10-CM | POA: Diagnosis not present

## 2016-06-16 DIAGNOSIS — I252 Old myocardial infarction: Secondary | ICD-10-CM | POA: Diagnosis not present

## 2016-06-16 DIAGNOSIS — I2510 Atherosclerotic heart disease of native coronary artery without angina pectoris: Secondary | ICD-10-CM | POA: Diagnosis not present

## 2016-06-16 DIAGNOSIS — Z955 Presence of coronary angioplasty implant and graft: Secondary | ICD-10-CM | POA: Diagnosis not present

## 2016-06-16 DIAGNOSIS — G909 Disorder of the autonomic nervous system, unspecified: Secondary | ICD-10-CM | POA: Diagnosis not present

## 2016-06-27 DIAGNOSIS — M19041 Primary osteoarthritis, right hand: Secondary | ICD-10-CM | POA: Diagnosis not present

## 2016-06-27 DIAGNOSIS — M654 Radial styloid tenosynovitis [de Quervain]: Secondary | ICD-10-CM | POA: Diagnosis not present

## 2016-07-04 DIAGNOSIS — M654 Radial styloid tenosynovitis [de Quervain]: Secondary | ICD-10-CM | POA: Diagnosis not present

## 2016-07-10 DIAGNOSIS — M19041 Primary osteoarthritis, right hand: Secondary | ICD-10-CM | POA: Diagnosis not present

## 2016-07-10 DIAGNOSIS — M654 Radial styloid tenosynovitis [de Quervain]: Secondary | ICD-10-CM | POA: Diagnosis not present

## 2016-08-26 DIAGNOSIS — M654 Radial styloid tenosynovitis [de Quervain]: Secondary | ICD-10-CM | POA: Diagnosis not present

## 2016-08-26 DIAGNOSIS — M19041 Primary osteoarthritis, right hand: Secondary | ICD-10-CM | POA: Diagnosis not present

## 2016-08-26 DIAGNOSIS — M1811 Unilateral primary osteoarthritis of first carpometacarpal joint, right hand: Secondary | ICD-10-CM | POA: Diagnosis not present

## 2016-09-09 DIAGNOSIS — E782 Mixed hyperlipidemia: Secondary | ICD-10-CM | POA: Diagnosis not present

## 2016-09-09 DIAGNOSIS — R300 Dysuria: Secondary | ICD-10-CM | POA: Diagnosis not present

## 2016-09-09 DIAGNOSIS — R7303 Prediabetes: Secondary | ICD-10-CM | POA: Diagnosis not present

## 2016-09-09 DIAGNOSIS — M654 Radial styloid tenosynovitis [de Quervain]: Secondary | ICD-10-CM | POA: Diagnosis not present

## 2016-09-09 DIAGNOSIS — I2510 Atherosclerotic heart disease of native coronary artery without angina pectoris: Secondary | ICD-10-CM | POA: Diagnosis not present

## 2016-09-09 DIAGNOSIS — K219 Gastro-esophageal reflux disease without esophagitis: Secondary | ICD-10-CM | POA: Diagnosis not present

## 2016-09-17 ENCOUNTER — Other Ambulatory Visit: Payer: Self-pay | Admitting: Family Medicine

## 2016-09-19 ENCOUNTER — Ambulatory Visit
Admission: RE | Admit: 2016-09-19 | Discharge: 2016-09-19 | Disposition: A | Discharge status: Home / Self Care | Payer: PPO | Source: Ambulatory Visit | Attending: Family Medicine | Admitting: Family Medicine

## 2016-09-19 DIAGNOSIS — E049 Nontoxic goiter, unspecified: Secondary | ICD-10-CM

## 2016-10-21 DIAGNOSIS — I950 Idiopathic hypotension: Secondary | ICD-10-CM | POA: Diagnosis not present

## 2016-10-21 DIAGNOSIS — I2510 Atherosclerotic heart disease of native coronary artery without angina pectoris: Secondary | ICD-10-CM | POA: Diagnosis not present

## 2016-10-21 DIAGNOSIS — Z955 Presence of coronary angioplasty implant and graft: Secondary | ICD-10-CM | POA: Diagnosis not present

## 2016-10-21 DIAGNOSIS — I252 Old myocardial infarction: Secondary | ICD-10-CM | POA: Diagnosis not present

## 2016-11-07 DIAGNOSIS — M19041 Primary osteoarthritis, right hand: Secondary | ICD-10-CM | POA: Diagnosis not present

## 2016-11-07 DIAGNOSIS — M1811 Unilateral primary osteoarthritis of first carpometacarpal joint, right hand: Secondary | ICD-10-CM | POA: Diagnosis not present

## 2017-02-13 DIAGNOSIS — M19041 Primary osteoarthritis, right hand: Secondary | ICD-10-CM | POA: Diagnosis not present

## 2017-02-13 DIAGNOSIS — M1811 Unilateral primary osteoarthritis of first carpometacarpal joint, right hand: Secondary | ICD-10-CM | POA: Diagnosis not present

## 2017-02-13 DIAGNOSIS — M654 Radial styloid tenosynovitis [de Quervain]: Secondary | ICD-10-CM | POA: Diagnosis not present

## 2017-03-31 DIAGNOSIS — E782 Mixed hyperlipidemia: Secondary | ICD-10-CM | POA: Diagnosis not present

## 2017-03-31 DIAGNOSIS — K219 Gastro-esophageal reflux disease without esophagitis: Secondary | ICD-10-CM | POA: Diagnosis not present

## 2017-03-31 DIAGNOSIS — G894 Chronic pain syndrome: Secondary | ICD-10-CM | POA: Diagnosis not present

## 2017-03-31 DIAGNOSIS — I2510 Atherosclerotic heart disease of native coronary artery without angina pectoris: Secondary | ICD-10-CM | POA: Diagnosis not present

## 2017-03-31 DIAGNOSIS — N951 Menopausal and female climacteric states: Secondary | ICD-10-CM | POA: Diagnosis not present

## 2017-03-31 DIAGNOSIS — R7303 Prediabetes: Secondary | ICD-10-CM | POA: Diagnosis not present

## 2017-03-31 DIAGNOSIS — J4520 Mild intermittent asthma, uncomplicated: Secondary | ICD-10-CM | POA: Diagnosis not present

## 2017-05-15 DIAGNOSIS — M654 Radial styloid tenosynovitis [de Quervain]: Secondary | ICD-10-CM | POA: Diagnosis not present

## 2017-05-15 DIAGNOSIS — M1811 Unilateral primary osteoarthritis of first carpometacarpal joint, right hand: Secondary | ICD-10-CM | POA: Diagnosis not present

## 2017-05-15 DIAGNOSIS — M19041 Primary osteoarthritis, right hand: Secondary | ICD-10-CM | POA: Diagnosis not present

## 2017-05-20 ENCOUNTER — Other Ambulatory Visit: Payer: Self-pay

## 2017-05-20 ENCOUNTER — Emergency Department (HOSPITAL_COMMUNITY): Payer: PPO

## 2017-05-20 ENCOUNTER — Encounter (HOSPITAL_COMMUNITY): Payer: Self-pay | Admitting: Emergency Medicine

## 2017-05-20 DIAGNOSIS — R5383 Other fatigue: Secondary | ICD-10-CM | POA: Insufficient documentation

## 2017-05-20 DIAGNOSIS — Z87891 Personal history of nicotine dependence: Secondary | ICD-10-CM | POA: Insufficient documentation

## 2017-05-20 DIAGNOSIS — Z6379 Other stressful life events affecting family and household: Secondary | ICD-10-CM | POA: Diagnosis not present

## 2017-05-20 DIAGNOSIS — Z7982 Long term (current) use of aspirin: Secondary | ICD-10-CM | POA: Diagnosis not present

## 2017-05-20 DIAGNOSIS — R112 Nausea with vomiting, unspecified: Secondary | ICD-10-CM | POA: Diagnosis not present

## 2017-05-20 DIAGNOSIS — I2510 Atherosclerotic heart disease of native coronary artery without angina pectoris: Secondary | ICD-10-CM | POA: Diagnosis not present

## 2017-05-20 DIAGNOSIS — R0789 Other chest pain: Secondary | ICD-10-CM | POA: Insufficient documentation

## 2017-05-20 DIAGNOSIS — R110 Nausea: Secondary | ICD-10-CM | POA: Diagnosis not present

## 2017-05-20 DIAGNOSIS — Z79899 Other long term (current) drug therapy: Secondary | ICD-10-CM | POA: Insufficient documentation

## 2017-05-20 LAB — I-STAT TROPONIN, ED: TROPONIN I, POC: 0 ng/mL (ref 0.00–0.08)

## 2017-05-20 LAB — BASIC METABOLIC PANEL
Anion gap: 12 (ref 5–15)
BUN: 11 mg/dL (ref 6–20)
CALCIUM: 9.1 mg/dL (ref 8.9–10.3)
CHLORIDE: 103 mmol/L (ref 101–111)
CO2: 26 mmol/L (ref 22–32)
CREATININE: 0.61 mg/dL (ref 0.44–1.00)
GFR calc non Af Amer: >60 mL/min (ref >60)
Glucose, Bld: 92 mg/dL (ref 65–99)
Potassium: 3.9 mmol/L (ref 3.5–5.1)
SODIUM: 141 mmol/L (ref 135–145)

## 2017-05-20 LAB — CBC
HCT: 38.1 % (ref 36.0–46.0)
Hemoglobin: 12.1 g/dL (ref 12.0–15.0)
MCH: 25.6 pg — AB (ref 26.0–34.0)
MCHC: 31.8 g/dL (ref 30.0–36.0)
MCV: 80.5 fL (ref 78.0–100.0)
PLATELETS: 274 10*3/uL (ref 150–400)
RBC: 4.73 MIL/uL (ref 3.87–5.11)
RDW: 17 % — ABNORMAL HIGH (ref 11.5–15.5)
WBC: 6.9 10*3/uL (ref 4.0–10.5)

## 2017-05-20 NOTE — ED Notes (Signed)
Pt wanted to know which labs were drawn on her.  Informed her of test completed and that the MD would have to give her the results.

## 2017-05-20 NOTE — ED Triage Notes (Signed)
Pt sent by [facility] for R/O "silent MI" pt has had N/V, chest pressure, L underarm pain for past week. Pt has been under stress, sibling died a week ago. Currently pain free

## 2017-05-21 ENCOUNTER — Emergency Department (HOSPITAL_COMMUNITY)
Admission: EM | Admit: 2017-05-21 | Discharge: 2017-05-21 | Disposition: A | Discharge status: Home / Self Care | Payer: PPO | Attending: Emergency Medicine | Admitting: Emergency Medicine

## 2017-05-21 DIAGNOSIS — R0789 Other chest pain: Secondary | ICD-10-CM

## 2017-05-21 LAB — I-STAT TROPONIN, ED: TROPONIN I, POC: 0 ng/mL (ref 0.00–0.08)

## 2017-05-21 NOTE — ED Provider Notes (Signed)
[place] EMERGENCY DEPARTMENT Provider Note   CSN: [id number] Arrival date & time: 05/20/17  1918     History   Chief Complaint Chief Complaint  Patient presents with  . Sent by PCP    HPI Briana Bowers is a 67 y.o. female.  The history is provided by the patient and medical records.     67 year old female with history of anxiety, arthritis, coronary artery disease status post stenting, chronic fatigue, chronic back pain, hyperlipidemia, hypotension, presenting to the ED with chest pain.  Patient is somewhat agitated during my initial assessment as she has been waiting for several hours.  Triage note reports chest pain, however patient states she ultimately went to her primary care doctor's office for fatigue.  States over the past few days she has had very little sleep and has been run down.  Her brother just died a few days ago and her husband was admitted to the [facility] so she has been up and running back and forth trying to handle everything.  States she has felt rundown, tired, and has had some chest pains.  States she has noticed her blood pressure has been running slightly higher than normal.  She does tend to get chest pain when her blood pressure fluctuates.  She has not had any shortness of breath, dizziness, diaphoresis, nausea, or vomiting.  She did have nausea and vomiting 2 weeks ago when she was sick with a GI bug, none recently, but does feel this is probably contributing to her fatigue.  No fever or chills.  Patient previously followed by [facility] heart care, however after multiple disputes with providers there she is now followed by [facility] cardiology.  Last stress test in February 2018 was normal.  Past Medical History:  Diagnosis Date  . Anxiety disorder 12/27/2012  . Arthritis    Degenerative joint disease  . CAD (coronary artery disease), native coronary artery 12/27/2012   a. NSTEMI/RCA stent 1999. b. LAD stent 2014. c. Neg nuc 11/2014.  Marland Kitchen Chronic  fatigue 12/27/2012  . Chronic lower back pain   . Gastroesophageal reflux disease 12/27/2012  . H/O hiatal hernia   . History of blood transfusion    w/tonsillectomy; w/spinal fusion  . Hyperlipidemia   . Hypotension    a. prompting holding of BB.  Marland Kitchen Myocardial infarction [facility]) 08/02/1997    Patient Active Problem List   Diagnosis Date Noted  . Chest pain, unspecified   . CAD (coronary artery disease), native coronary artery 12/27/2012    Class: Chronic  . Hyperlipidemia 12/27/2012    Class: Chronic  . Degenerative joint disease 12/27/2012  . Prediabetes 12/27/2012    Class: Chronic  . Gastroesophageal reflux disease 12/27/2012    Class: Chronic  . Anxiety disorder 12/27/2012    Class: Chronic  . Chronic fatigue 12/27/2012    Class: Chronic    Past Surgical History:  Procedure Laterality Date  . APPENDECTOMY  1965  . BACK SURGERY    . BILATERAL SALPINGOOPHORECTOMY Bilateral 1978   "2 separate ORs; months apart" (12/27/2012)  . CARDIAC CATHETERIZATION  2000's   "couple times" (12/27/2012)  . CHOLECYSTECTOMY  08/1989  . CORONARY ANGIOPLASTY WITH STENT PLACEMENT  08/03/1997; 12/28/2012   1 + 1" (01/19/2013)  . DILATION AND CURETTAGE OF UTERUS  09/1967   "after miscarriage" (12/27/2012)  . FINGER ARTHROPLASTY Right 12/232011   "ring finger; fused it; put pin in it; 2nd joint" (12/27/2012)  . FRACTIONAL FLOW RESERVE WIRE  12/28/2012  Procedure: FRACTIONAL FLOW RESERVE WIRE;  Surgeon: Sinclair Grooms, MD;  Location: [facility] CATH LAB;  Service: Cardiovascular;;  PROX LAD  . GANGLION CYST EXCISION Right 1990's  . 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 HERNIA REPAIR  02/2002   "ventral" (12/27/2012)  . LEFT HEART CATHETERIZATION WITH CORONARY ANGIOGRAM N/A 12/28/2012   Procedure: LEFT HEART CATHETERIZATION WITH CORONARY ANGIOGRAM;  Surgeon: Sinclair Grooms, MD;  Location: [facility] CATH LAB;  Service: Cardiovascular;  Laterality:  N/A;  . LEFT HEART CATHETERIZATION WITH CORONARY ANGIOGRAM N/A 01/20/2013   Procedure: LEFT HEART CATHETERIZATION WITH CORONARY ANGIOGRAM;  Surgeon: Blane Ohara, MD;  Location: [facility] CATH LAB;  Service: Cardiovascular;  Laterality: N/A;  . LIGAMENT REPAIR Right 1990's   "radial collateral" (12/27/2012)  . [place]; 1990's   "twice" (12/27/2012)  . MASS EXCISION Left 1990's   "groin" (12/27/2012)  . NASAL SEPTUM SURGERY  1969  . PARTIAL HYSTERECTOMY  1974  . PERCUTANEOUS CORONARY STENT INTERVENTION (PCI-S)  12/28/2012   Procedure: PERCUTANEOUS CORONARY STENT INTERVENTION (PCI-S);  Surgeon: Sinclair Grooms, MD;  Location: [facility] CATH LAB;  Service: Cardiovascular;;  Prox LAD  . POSTERIOR LUMBAR FUSION  04/2002  . TONSILLECTOMY  1964  . TYMPANOPLASTY Bilateral 1966; 2001   "left; right" (12/27/2012)    OB History    No data available       Home Medications    Prior to Admission medications   Medication Sig Start Date End Date Taking? Authorizing Provider  acidophilus (RISAQUAD) CAPS capsule Take 2 capsules by mouth daily.     [provider]  albuterol (PROAIR HFA) 108 (90 BASE) MCG/ACT inhaler Inhale 2 puffs into the lungs every 6 (six) hours as needed for wheezing or shortness of breath.    [provider]  aspirin EC 325 MG tablet Take 1 tablet (325 mg total) by mouth daily. 01/03/14   Jettie Booze, MD  b complex vitamins capsule Take 1 capsule by mouth daily.    [provider]  Budesonide-Formoterol Fumarate (SYMBICORT IN) Inhale 1 puff into the lungs 2 (two) times daily as needed (shortness of breath).    [provider]  Cholecalciferol (VITAMIN D3 PO) Take 1 tablet by mouth daily.    [provider]  Coenzyme Q10 (CO Q 10 PO) Take 1 tablet by mouth daily.    [provider]  estrogen-methylTESTOSTERone (ESTRATEST) 1.25-2.5 MG per tablet Take 1 tablet by mouth at bedtime.    [provider]  fish  oil-omega-3 fatty acids 1000 MG capsule Take 1 g by mouth daily.    [provider]  fluticasone (FLONASE) 50 MCG/ACT nasal spray Place 2 sprays into both nostrils daily as needed for allergies or rhinitis.    [provider]  HYDROcodone-acetaminophen (NORCO/VICODIN) 5-325 MG per tablet Take 1 tablet by mouth every 6 (six) hours as needed for moderate pain. 03/31/14   Margarita Mail, PA-C  metaxalone (SKELAXIN) 800 MG tablet Take 400-800 mg by mouth 2 (two) times daily as needed for muscle spasms.     [provider]  pantoprazole (PROTONIX) 40 MG tablet Take 40 mg by mouth 2 (two) times daily.    [provider]  rosuvastatin (CRESTOR) 20 MG tablet Take 20 mg by mouth.    [provider]    Family History Family History  Problem Relation Age of Onset  .  Coronary artery disease Mother   . Coronary artery disease Father   . Other Daughter        TBI  . Other Son        Suicide    Social History Social History   Tobacco Use  . Smoking status: Former Smoker    Packs/day: 1.00    Years: 17.00    Pack years: 17.00    Types: Cigarettes    Last attempt to quit: 12/27/1981    Years since quitting: 35.4  . Smokeless tobacco: Never Used  Substance Use Topics  . Alcohol use: No    Alcohol/week: 0.0 oz  . Drug use: No     Allergies   Atorvastatin; Ciprofloxacin; Levaquin [levofloxacin in d5w]; and Dilaudid [hydromorphone hcl]   Review of Systems Review of Systems  Constitutional: Positive for fatigue.  Cardiovascular: Positive for chest pain.  All other systems reviewed and are negative.    Physical Exam Updated Vital Signs BP (!) 176/87   Pulse 81   Temp 98.4 F (36.9 C) (Oral)   Resp 16   Ht 5\' 2"  (1.575 m)   Wt 66.2 kg (146 lb)   SpO2 98%   BMI 26.70 kg/m   Physical Exam  Constitutional: She is oriented to person, place, and time. She appears well-developed and well-nourished.  HENT:  Head: Normocephalic and  atraumatic.  Mouth/Throat: Oropharynx is clear and moist.  Eyes: Conjunctivae and EOM are normal. Pupils are equal, round, and reactive to light.  Neck: Normal range of motion.  Cardiovascular: Normal rate, regular rhythm and normal heart sounds.  Pulmonary/Chest: Effort normal and breath sounds normal.  Abdominal: Soft. Bowel sounds are normal.  Musculoskeletal: Normal range of motion.  Neurological: She is alert and oriented to person, place, and time.  Skin: Skin is warm and dry.  Psychiatric:  Hostile, aggressive tone  Nursing note and vitals reviewed.    ED Treatments / Results  Labs (all labs ordered are listed, but only abnormal results are displayed) Labs Reviewed  CBC - Abnormal; Notable for the following components:      Result Value   MCH 25.6 (*)    RDW 17.0 (*)    All other components within normal limits  BASIC METABOLIC PANEL  I-STAT TROPONIN, ED  I-STAT TROPONIN, ED  I-STAT TROPONIN, ED    EKG  EKG Interpretation  Date/Time:  Wednesday May 20 2017 20:15:18 EST Ventricular Rate:  71 PR Interval:  174 QRS Duration: 96 QT Interval:  394 QTC Calculation: 428 R Axis:   -10 Text Interpretation:  Normal sinus rhythm Possible Left atrial enlargement Incomplete right bundle branch block Anteroseptal infarct , age undetermined Abnormal ECG Confirmed by Ripley Fraise [phone]) on 05/21/2017 5:37:26 AM       Radiology Dg Chest 2 View  Result Date: 05/20/2017 CLINICAL DATA:  Nausea and vomiting. Left axillary pain. Hypotension. Fatigue. Two weeks duration. EXAM: CHEST  2 VIEW COMPARISON:  06/22/2013 FINDINGS: Heart size is normal. Left system stents in place. Mediastinal shadows are normal. The lungs are clear. The vascularity is normal. No effusions. No significant bone finding. IMPRESSION: No active disease.  Left coronary stents. Electronically Signed   By: Nelson Chimes M.D.   On: 05/20/2017 20:50    Procedures Procedures (including critical care  time)  Medications Ordered in ED Medications - No data to display   Initial Impression / Assessment and Plan / ED Course  I have reviewed the triage vital signs and the nursing  notes.  Pertinent labs & imaging results that were available during my care of the patient were reviewed by me and considered in my medical decision making (see chart for details).  5:53 AM Patient seen and evaluated.  History was somewhat difficult as patient is not forthcoming with details and seems very annoyed when I ask her questions.  Seems she mostly was being evaluated at PCP for fatigue but did mention some chest pain and given cardiac history was sent in for evaluation. She has been under a great deal of stress as her brother just died, her husband was admitted to the [place], and she has not had much sleep.  BP has been fluctuating, currently higher than baseline.  States she generally gets chest pain when her blood pressure fluctuates like this.  Patient then refused to answer any further questions and stated she would just like her results because "this place is terrible".  I have acknowledged her significant wait time multiple times and apologized to patient and her daughter who is sitting at the bedside.  I can clearly understand their frustration, however I have attempted to relay to patient that unfortunately situation is beyond my personal control, however I have tried to see and evaluate her in a timely fashion after being placed into room.  She continues arguing with me that "no one understands" and "no one cares about her".  I have tried to redirect the conversation several times, however patient remains argumentative.  Patient ultimately just asked for her results which I have discussed with her in detail-- EKG unchanged, labs reassuring, trop negative x2, CXR clear.  There have been no acute changes.  Her blood pressure today is elevated compared to her baseline, however this may be related to stress and other  factors.  There is no evidence of endorgan damage at this time.  I do feel she can be discharged safely to follow-up with her cardiologist.  Given copies of labs for physician review.  She understands to return here for any new/acute changes.  Final Clinical Impressions(s) / ED Diagnoses   Final diagnoses:  Other chest pain    ED Discharge Orders    None       Larene Pickett, PA-C 05/21/17 [id number]    Larene Pickett, PA-C 05/21/17 [id number]    Ripley Fraise, MD 05/21/17 [phone]

## 2017-05-21 NOTE — Discharge Instructions (Signed)
Follow-up with your cardiologist-- labs on back. Return here for any new changes.

## 2017-05-21 NOTE — ED Notes (Signed)
Pt remains in waiting room. Updated on wait for treatment room. Repeat Troponin collected.

## 2017-05-21 NOTE — ED Notes (Signed)
Pt questioned when a repeat troponin would be drawn.  Updated pt and she states she is leaving.  Encouraged her to stay and she declined.

## 2017-05-21 NOTE — ED Notes (Signed)
Pt returned with daughter.  Daughter wanting pt to stay and pt wanting to leave.  I spoke with patient and she went back to sit down in waiting room.

## 2017-05-26 DIAGNOSIS — I252 Old myocardial infarction: Secondary | ICD-10-CM | POA: Diagnosis not present

## 2017-05-26 DIAGNOSIS — Z955 Presence of coronary angioplasty implant and graft: Secondary | ICD-10-CM | POA: Diagnosis not present

## 2017-05-26 DIAGNOSIS — I209 Angina pectoris, unspecified: Secondary | ICD-10-CM | POA: Diagnosis not present

## 2017-05-26 DIAGNOSIS — I950 Idiopathic hypotension: Secondary | ICD-10-CM | POA: Diagnosis not present

## 2017-05-26 DIAGNOSIS — I10 Essential (primary) hypertension: Secondary | ICD-10-CM | POA: Diagnosis not present

## 2017-08-02 DIAGNOSIS — J069 Acute upper respiratory infection, unspecified: Secondary | ICD-10-CM | POA: Diagnosis not present

## 2017-08-02 DIAGNOSIS — H6121 Impacted cerumen, right ear: Secondary | ICD-10-CM | POA: Diagnosis not present

## 2017-08-04 DIAGNOSIS — J45901 Unspecified asthma with (acute) exacerbation: Secondary | ICD-10-CM | POA: Diagnosis not present

## 2017-08-20 ENCOUNTER — Telehealth: Payer: Self-pay | Admitting: Neurology

## 2017-08-20 DIAGNOSIS — M19041 Primary osteoarthritis, right hand: Secondary | ICD-10-CM | POA: Diagnosis not present

## 2017-08-20 NOTE — Telephone Encounter (Signed)
Patient called needing to see if Dr. [name] can write another Jury Duty Letter like before but change the date on it? Please Call. Thanks

## 2017-08-21 ENCOUNTER — Encounter: Payer: Self-pay | Admitting: *Deleted

## 2017-08-21 NOTE — Telephone Encounter (Signed)
Patient's husband notified that letter is ready.  Letter mailed per his request.

## 2017-09-29 ENCOUNTER — Other Ambulatory Visit: Payer: Self-pay | Admitting: Family Medicine

## 2017-09-29 DIAGNOSIS — M19041 Primary osteoarthritis, right hand: Secondary | ICD-10-CM | POA: Diagnosis not present

## 2017-09-29 DIAGNOSIS — E041 Nontoxic single thyroid nodule: Secondary | ICD-10-CM

## 2017-10-20 DIAGNOSIS — I10 Essential (primary) hypertension: Secondary | ICD-10-CM | POA: Diagnosis not present

## 2017-11-06 ENCOUNTER — Ambulatory Visit
Admission: RE | Admit: 2017-11-06 | Discharge: 2017-11-06 | Disposition: A | Discharge status: Home / Self Care | Payer: PPO | Source: Ambulatory Visit | Attending: Family Medicine | Admitting: Family Medicine

## 2017-11-06 ENCOUNTER — Other Ambulatory Visit: Payer: Self-pay | Admitting: Family Medicine

## 2017-11-06 DIAGNOSIS — E041 Nontoxic single thyroid nodule: Secondary | ICD-10-CM

## 2017-11-06 DIAGNOSIS — Z1231 Encounter for screening mammogram for malignant neoplasm of breast: Secondary | ICD-10-CM

## 2017-11-11 DIAGNOSIS — E782 Mixed hyperlipidemia: Secondary | ICD-10-CM | POA: Diagnosis not present

## 2017-11-11 DIAGNOSIS — Z1239 Encounter for other screening for malignant neoplasm of breast: Secondary | ICD-10-CM | POA: Diagnosis not present

## 2017-11-11 DIAGNOSIS — K219 Gastro-esophageal reflux disease without esophagitis: Secondary | ICD-10-CM | POA: Diagnosis not present

## 2017-11-11 DIAGNOSIS — N951 Menopausal and female climacteric states: Secondary | ICD-10-CM | POA: Diagnosis not present

## 2017-11-11 DIAGNOSIS — R7303 Prediabetes: Secondary | ICD-10-CM | POA: Diagnosis not present

## 2017-11-11 DIAGNOSIS — F320 Major depressive disorder, single episode, mild: Secondary | ICD-10-CM | POA: Diagnosis not present

## 2017-11-11 DIAGNOSIS — G894 Chronic pain syndrome: Secondary | ICD-10-CM | POA: Diagnosis not present

## 2017-11-17 DIAGNOSIS — I208 Other forms of angina pectoris: Secondary | ICD-10-CM | POA: Diagnosis not present

## 2017-11-17 DIAGNOSIS — Z955 Presence of coronary angioplasty implant and graft: Secondary | ICD-10-CM | POA: Diagnosis not present

## 2017-11-17 DIAGNOSIS — R002 Palpitations: Secondary | ICD-10-CM | POA: Diagnosis not present

## 2017-11-17 DIAGNOSIS — I2510 Atherosclerotic heart disease of native coronary artery without angina pectoris: Secondary | ICD-10-CM | POA: Diagnosis not present

## 2017-11-17 DIAGNOSIS — R42 Dizziness and giddiness: Secondary | ICD-10-CM | POA: Diagnosis not present

## 2017-11-17 DIAGNOSIS — I950 Idiopathic hypotension: Secondary | ICD-10-CM | POA: Diagnosis not present

## 2017-11-17 DIAGNOSIS — I252 Old myocardial infarction: Secondary | ICD-10-CM | POA: Diagnosis not present

## 2017-11-17 DIAGNOSIS — I10 Essential (primary) hypertension: Secondary | ICD-10-CM | POA: Diagnosis not present

## 2017-11-18 DIAGNOSIS — R42 Dizziness and giddiness: Secondary | ICD-10-CM | POA: Diagnosis not present

## 2017-11-30 ENCOUNTER — Ambulatory Visit: Payer: PPO

## 2017-12-03 DIAGNOSIS — I209 Angina pectoris, unspecified: Secondary | ICD-10-CM | POA: Diagnosis not present

## 2017-12-03 DIAGNOSIS — I208 Other forms of angina pectoris: Secondary | ICD-10-CM | POA: Diagnosis not present

## 2017-12-03 DIAGNOSIS — I2510 Atherosclerotic heart disease of native coronary artery without angina pectoris: Secondary | ICD-10-CM | POA: Diagnosis not present

## 2017-12-11 DIAGNOSIS — M19041 Primary osteoarthritis, right hand: Secondary | ICD-10-CM | POA: Diagnosis not present

## 2017-12-15 DIAGNOSIS — I950 Idiopathic hypotension: Secondary | ICD-10-CM | POA: Diagnosis not present

## 2017-12-15 DIAGNOSIS — I10 Essential (primary) hypertension: Secondary | ICD-10-CM | POA: Diagnosis not present

## 2017-12-16 ENCOUNTER — Telehealth: Payer: Self-pay | Admitting: Neurology

## 2017-12-16 NOTE — Telephone Encounter (Signed)
I Called patient and she did not want to schedule at this time. She said she would call back if she changed her mind. Thanks

## 2017-12-16 NOTE — Telephone Encounter (Signed)
Patient called and would like to speak with you regarding her Referral for Neuro testing? She would like you to re fax the referral and she also wanted to know the name of the facility as well as the Number. She said that she is out of network but will pay. Please Call. Thanks

## 2017-12-16 NOTE — Telephone Encounter (Signed)
This patient has not been seen since 2016 so she will need a new patient appointment.  Thanks.

## 2017-12-17 DIAGNOSIS — I455 Other specified heart block: Secondary | ICD-10-CM

## 2017-12-17 HISTORY — DX: Other specified heart block: I45.5

## 2017-12-17 NOTE — Telephone Encounter (Signed)
Noted  

## 2018-01-05 DIAGNOSIS — I252 Old myocardial infarction: Secondary | ICD-10-CM | POA: Diagnosis not present

## 2018-01-05 DIAGNOSIS — I2510 Atherosclerotic heart disease of native coronary artery without angina pectoris: Secondary | ICD-10-CM | POA: Diagnosis not present

## 2018-01-05 DIAGNOSIS — R0989 Other specified symptoms and signs involving the circulatory and respiratory systems: Secondary | ICD-10-CM | POA: Diagnosis not present

## 2018-01-05 DIAGNOSIS — Z955 Presence of coronary angioplasty implant and graft: Secondary | ICD-10-CM | POA: Diagnosis not present

## 2018-01-05 DIAGNOSIS — R002 Palpitations: Secondary | ICD-10-CM | POA: Diagnosis not present

## 2018-01-05 DIAGNOSIS — I208 Other forms of angina pectoris: Secondary | ICD-10-CM | POA: Diagnosis not present

## 2018-01-07 DIAGNOSIS — G901 Familial dysautonomia [Riley-Day]: Secondary | ICD-10-CM | POA: Diagnosis not present

## 2018-01-07 DIAGNOSIS — R002 Palpitations: Secondary | ICD-10-CM | POA: Diagnosis not present

## 2018-01-07 DIAGNOSIS — G909 Disorder of the autonomic nervous system, unspecified: Secondary | ICD-10-CM | POA: Diagnosis not present

## 2018-01-07 DIAGNOSIS — I455 Other specified heart block: Secondary | ICD-10-CM | POA: Diagnosis not present

## 2018-02-15 DIAGNOSIS — G909 Disorder of the autonomic nervous system, unspecified: Secondary | ICD-10-CM | POA: Diagnosis not present

## 2018-02-15 DIAGNOSIS — Z1389 Encounter for screening for other disorder: Secondary | ICD-10-CM | POA: Diagnosis not present

## 2018-02-15 DIAGNOSIS — F320 Major depressive disorder, single episode, mild: Secondary | ICD-10-CM | POA: Diagnosis not present

## 2018-02-15 DIAGNOSIS — E782 Mixed hyperlipidemia: Secondary | ICD-10-CM | POA: Diagnosis not present

## 2018-03-04 DIAGNOSIS — I10 Essential (primary) hypertension: Secondary | ICD-10-CM | POA: Diagnosis not present

## 2018-03-04 DIAGNOSIS — Z955 Presence of coronary angioplasty implant and graft: Secondary | ICD-10-CM | POA: Diagnosis not present

## 2018-03-04 DIAGNOSIS — G909 Disorder of the autonomic nervous system, unspecified: Secondary | ICD-10-CM | POA: Diagnosis not present

## 2018-03-04 DIAGNOSIS — R002 Palpitations: Secondary | ICD-10-CM | POA: Diagnosis not present

## 2018-03-09 DIAGNOSIS — J329 Chronic sinusitis, unspecified: Secondary | ICD-10-CM

## 2018-03-09 DIAGNOSIS — R430 Anosmia: Secondary | ICD-10-CM | POA: Diagnosis not present

## 2018-03-09 DIAGNOSIS — J019 Acute sinusitis, unspecified: Secondary | ICD-10-CM | POA: Diagnosis not present

## 2018-03-09 HISTORY — DX: Chronic sinusitis, unspecified: J32.9

## 2018-03-09 HISTORY — DX: Anosmia: R43.0

## 2018-03-16 DIAGNOSIS — M1811 Unilateral primary osteoarthritis of first carpometacarpal joint, right hand: Secondary | ICD-10-CM | POA: Diagnosis not present

## 2018-03-16 DIAGNOSIS — M19041 Primary osteoarthritis, right hand: Secondary | ICD-10-CM | POA: Diagnosis not present

## 2018-03-18 DIAGNOSIS — G909 Disorder of the autonomic nervous system, unspecified: Secondary | ICD-10-CM | POA: Diagnosis not present

## 2018-03-18 DIAGNOSIS — I10 Essential (primary) hypertension: Secondary | ICD-10-CM | POA: Diagnosis not present

## 2018-03-18 DIAGNOSIS — R002 Palpitations: Secondary | ICD-10-CM | POA: Diagnosis not present

## 2018-04-15 ENCOUNTER — Ambulatory Visit (HOSPITAL_COMMUNITY)
Admission: EM | Admit: 2018-04-15 | Discharge: 2018-04-15 | Disposition: A | Discharge status: Home / Self Care | Payer: PPO

## 2018-04-15 ENCOUNTER — Ambulatory Visit
Admission: EM | Admit: 2018-04-15 | Discharge: 2018-04-15 | Disposition: A | Discharge status: Home / Self Care | Payer: PPO

## 2018-04-15 ENCOUNTER — Encounter: Payer: Self-pay | Admitting: Emergency Medicine

## 2018-04-15 DIAGNOSIS — T171XXA Foreign body in nostril, initial encounter: Secondary | ICD-10-CM | POA: Diagnosis not present

## 2018-04-15 NOTE — Discharge Instructions (Signed)
Inserting foreign material in your nose can cause infection and bleeding. Discuss ongoing nasal congestion and loss of smell with your primary MD

## 2018-04-15 NOTE — ED Triage Notes (Signed)
Pt presents to urgent care due to foreign body in her nose (orange peel) since this morning.

## 2018-04-15 NOTE — ED Notes (Signed)
Patient able to ambulate independently  

## 2018-04-15 NOTE — ED Notes (Signed)
Patient access reports patient going to [facility]

## 2018-04-17 NOTE — ED Provider Notes (Signed)
[facility]    CSN: [id number] Arrival date & time: 04/15/18  1946     History   Chief Complaint Chief Complaint  Patient presents with  . Foreign Body in [place] is a 68 y.o. female.   The history is provided by the patient. No language interpreter was used.  Foreign Body in [place]  This is a new problem. The current episode started 1 to 2 hours ago. The problem occurs constantly. The problem has been gradually worsening. Nothing aggravates the symptoms. Nothing relieves the symptoms. She has tried nothing for the symptoms.  Pt reports she has congestion and no sense of smell.  Pt reports her sister told her to put orange pieces in her nose.  Pt reports she has a piece stuck and could not get out at home.   Past Medical History:  Diagnosis Date  . Anxiety disorder 12/27/2012  . Arthritis    Degenerative joint disease  . CAD (coronary artery disease), native coronary artery 12/27/2012   a. NSTEMI/RCA stent 1999. b. LAD stent 2014. c. Neg nuc 11/2014.  Marland Kitchen Chronic fatigue 12/27/2012  . Chronic lower back pain   . Gastroesophageal reflux disease 12/27/2012  . H/O hiatal hernia   . History of blood transfusion    w/tonsillectomy; w/spinal fusion  . Hyperlipidemia   . Hypotension    a. prompting holding of BB.  Marland Kitchen Myocardial infarction [facility]) 08/02/1997    Patient Active Problem List   Diagnosis Date Noted  . Chest pain, unspecified   . CAD (coronary artery disease), native coronary artery 12/27/2012    Class: Chronic  . Hyperlipidemia 12/27/2012    Class: Chronic  . Degenerative joint disease 12/27/2012  . Prediabetes 12/27/2012    Class: Chronic  . Gastroesophageal reflux disease 12/27/2012    Class: Chronic  . Anxiety disorder 12/27/2012    Class: Chronic  . Chronic fatigue 12/27/2012    Class: Chronic    Past Surgical History:  Procedure Laterality Date  . APPENDECTOMY  1965  . BACK SURGERY    . BILATERAL SALPINGOOPHORECTOMY  Bilateral 1978   "2 separate ORs; months apart" (12/27/2012)  . CARDIAC CATHETERIZATION  2000's   "couple times" (12/27/2012)  . CHOLECYSTECTOMY  08/1989  . CORONARY ANGIOPLASTY WITH STENT PLACEMENT  08/03/1997; 12/28/2012   1 + 1" (01/19/2013)  . DILATION AND CURETTAGE OF UTERUS  09/1967   "after miscarriage" (12/27/2012)  . FINGER ARTHROPLASTY Right 12/232011   "ring finger; fused it; put pin in it; 2nd joint" (12/27/2012)  . FRACTIONAL FLOW RESERVE WIRE  12/28/2012   Procedure: FRACTIONAL FLOW RESERVE WIRE;  Surgeon: Sinclair Grooms, MD;  Location: [facility] CATH LAB;  Service: Cardiovascular;;  PROX LAD  . GANGLION CYST EXCISION Right 1990's  . 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 HERNIA REPAIR  02/2002   "ventral" (12/27/2012)  . LEFT HEART CATHETERIZATION WITH CORONARY ANGIOGRAM N/A 12/28/2012   Procedure: LEFT HEART CATHETERIZATION WITH CORONARY ANGIOGRAM;  Surgeon: Sinclair Grooms, MD;  Location: [facility] CATH LAB;  Service: Cardiovascular;  Laterality: N/A;  . LEFT HEART CATHETERIZATION WITH CORONARY ANGIOGRAM N/A 01/20/2013   Procedure: LEFT HEART CATHETERIZATION WITH CORONARY ANGIOGRAM;  Surgeon: Blane Ohara, MD;  Location: [facility] CATH LAB;  Service: Cardiovascular;  Laterality: N/A;  . LIGAMENT REPAIR Right 1990's   "radial collateral" (12/27/2012)  . [place];  1990's   "twice" (12/27/2012)  . MASS EXCISION Left 1990's   "groin" (12/27/2012)  . NASAL SEPTUM SURGERY  1969  . PARTIAL HYSTERECTOMY  1974  . PERCUTANEOUS CORONARY STENT INTERVENTION (PCI-S)  12/28/2012   Procedure: PERCUTANEOUS CORONARY STENT INTERVENTION (PCI-S);  Surgeon: Sinclair Grooms, MD;  Location: [facility] CATH LAB;  Service: Cardiovascular;;  Prox LAD  . POSTERIOR LUMBAR FUSION  04/2002  . TONSILLECTOMY  1964  . TYMPANOPLASTY Bilateral 1966; 2001   "left; right" (12/27/2012)    OB History   No obstetric history on file.      Home  Medications    Prior to Admission medications   Medication Sig Start Date End Date Taking? Authorizing Provider  nortriptyline (PAMELOR) 50 MG capsule Take 50 mg by mouth at bedtime.   Yes [provider]  acidophilus (RISAQUAD) CAPS capsule Take 2 capsules by mouth daily.     [provider]  albuterol (PROAIR HFA) 108 (90 BASE) MCG/ACT inhaler Inhale 2 puffs into the lungs every 6 (six) hours as needed for wheezing or shortness of breath.    [provider]  aspirin EC 325 MG tablet Take 1 tablet (325 mg total) by mouth daily. 01/03/14   Jettie Booze, MD  b complex vitamins capsule Take 1 capsule by mouth daily.    [provider]  Budesonide-Formoterol Fumarate (SYMBICORT IN) Inhale 1 puff into the lungs 2 (two) times daily as needed (shortness of breath).    [provider]  Cholecalciferol (VITAMIN D3 PO) Take 1 tablet by mouth daily.    [provider]  Coenzyme Q10 (CO Q 10 PO) Take 1 tablet by mouth daily.    [provider]  estrogen-methylTESTOSTERone (ESTRATEST) 1.25-2.5 MG per tablet Take 1 tablet by mouth at bedtime.    [provider]  fish oil-omega-3 fatty acids 1000 MG capsule Take 1 g by mouth daily.    [provider]  fluticasone (FLONASE) 50 MCG/ACT nasal spray Place 2 sprays into both nostrils daily as needed for allergies or rhinitis.    [provider]  HYDROcodone-acetaminophen (NORCO/VICODIN) 5-325 MG per tablet Take 1 tablet by mouth every 6 (six) hours as needed for moderate pain. 03/31/14   Margarita Mail, PA-C  metaxalone (SKELAXIN) 800 MG tablet Take 400-800 mg by mouth 2 (two) times daily as needed for muscle spasms.     [provider]  pantoprazole (PROTONIX) 40 MG tablet Take 40 mg by mouth 2 (two) times daily.    [provider]  rosuvastatin (CRESTOR) 20 MG tablet Take 20 mg by mouth.    [provider]    Family History Family History   Problem Relation Age of Onset  . Coronary artery disease Mother   . Coronary artery disease Father   . Other Daughter        TBI  . Other Son        Suicide    Social History Social History   Tobacco Use  . Smoking status: Former Smoker    Packs/day: 1.00    Years: 17.00    Pack years: 17.00    Types: Cigarettes    Last attempt to quit: 12/27/1981    Years since quitting: 36.3  . Smokeless tobacco: Never Used  Substance Use Topics  . Alcohol use: No    Alcohol/week: 0.0 standard drinks  . Drug use: No     Allergies   Atorvastatin; Ciprofloxacin; Levaquin [levofloxacin in d5w]; and Dilaudid [  hydromorphone hcl]   Review of Systems Review of Systems  All other systems reviewed and are negative.    Physical Exam Triage Vital Signs ED Triage Vitals  Enc Vitals Group     BP 04/15/18 1955 (!) 124/91     Pulse Rate 04/15/18 1955 90     Resp 04/15/18 1955 18     Temp 04/15/18 1955 97.6 F (36.4 C)     Temp Source 04/15/18 1955 Oral     SpO2 04/15/18 1955 100 %     Weight --      Height --      Head Circumference --      Peak Flow --      Pain Score 04/15/18 1956 6     Pain Loc --      Pain Edu? --      Excl. in [place]? --    No data found.  Updated Vital Signs BP (!) 124/91 (BP Location: Left Arm)   Pulse 90   Temp 97.6 F (36.4 C) (Oral)   Resp 18   SpO2 100%   Visual Acuity Right Eye Distance:   Left Eye Distance:   Bilateral Distance:    Right Eye Near:   Left Eye Near:    Bilateral Near:     Physical Exam Vitals signs reviewed.  HENT:     Head:     Comments: Orange material in left nare    Nose: Nose normal.     Mouth/Throat:     Mouth: Mucous membranes are moist.  Eyes:     Pupils: Pupils are equal, round, and reactive to light.  Neck:     Musculoskeletal: Normal range of motion.  Cardiovascular:     Rate and Rhythm: Normal rate.  Pulmonary:     Effort: Pulmonary effort is normal.  Skin:    General: Skin is warm.     Capillary  Refill: Capillary refill takes less than 2 seconds.  Neurological:     General: No focal deficit present.     Mental Status: She is alert.  Psychiatric:        Mood and Affect: Mood normal.      UC Treatments / Results  Labs (all labs ordered are listed, but only abnormal results are displayed) Labs Reviewed - No data to display  EKG None  Radiology No results found.  Procedures Procedures (including critical care time)  Medications Ordered in UC Medications - No data to display  Initial Impression / Assessment and Plan / UC Course  I have reviewed the triage vital signs and the nursing notes.  Pertinent labs & imaging results that were available during my care of the patient were reviewed by me and considered in my medical decision making (see chart for details).     Foreign body removed with forcef.  Pt reports she plans to put more orange slices in her nose.  Pt states she plans to use larger slices.   I attempted to counsel pt on why she should not put things in her nose.  Final Clinical Impressions(s) / UC Diagnoses   Final diagnoses:  FB (nasal foreign body), initial encounter     Discharge Instructions     Inserting foreign material in your nose can cause infection and bleeding. Discuss ongoing nasal congestion and loss of smell with your primary MD   ED Prescriptions    None     Controlled Substance Prescriptions [place] Controlled Substance Registry consulted? Not Applicable  Fransico Meadow, [place] 04/17/18 0830

## 2018-04-28 DIAGNOSIS — R002 Palpitations: Secondary | ICD-10-CM | POA: Diagnosis not present

## 2018-05-13 DIAGNOSIS — R001 Bradycardia, unspecified: Secondary | ICD-10-CM | POA: Diagnosis not present

## 2018-05-14 DIAGNOSIS — K219 Gastro-esophageal reflux disease without esophagitis: Secondary | ICD-10-CM | POA: Diagnosis not present

## 2018-05-14 DIAGNOSIS — G909 Disorder of the autonomic nervous system, unspecified: Secondary | ICD-10-CM | POA: Diagnosis not present

## 2018-05-14 DIAGNOSIS — N951 Menopausal and female climacteric states: Secondary | ICD-10-CM | POA: Diagnosis not present

## 2018-05-14 DIAGNOSIS — I2510 Atherosclerotic heart disease of native coronary artery without angina pectoris: Secondary | ICD-10-CM | POA: Diagnosis not present

## 2018-05-14 DIAGNOSIS — J4520 Mild intermittent asthma, uncomplicated: Secondary | ICD-10-CM | POA: Diagnosis not present

## 2018-05-14 DIAGNOSIS — R7303 Prediabetes: Secondary | ICD-10-CM | POA: Diagnosis not present

## 2018-05-14 DIAGNOSIS — Z0000 Encounter for general adult medical examination without abnormal findings: Secondary | ICD-10-CM | POA: Diagnosis not present

## 2018-05-14 DIAGNOSIS — E782 Mixed hyperlipidemia: Secondary | ICD-10-CM | POA: Diagnosis not present

## 2018-05-18 DIAGNOSIS — M1811 Unilateral primary osteoarthritis of first carpometacarpal joint, right hand: Secondary | ICD-10-CM | POA: Diagnosis not present

## 2018-05-18 DIAGNOSIS — M19041 Primary osteoarthritis, right hand: Secondary | ICD-10-CM | POA: Diagnosis not present

## 2018-05-26 DIAGNOSIS — I2510 Atherosclerotic heart disease of native coronary artery without angina pectoris: Secondary | ICD-10-CM | POA: Diagnosis not present

## 2018-05-27 DIAGNOSIS — I2510 Atherosclerotic heart disease of native coronary artery without angina pectoris: Secondary | ICD-10-CM | POA: Diagnosis not present

## 2018-05-27 DIAGNOSIS — I252 Old myocardial infarction: Secondary | ICD-10-CM | POA: Diagnosis not present

## 2018-05-27 DIAGNOSIS — Z955 Presence of coronary angioplasty implant and graft: Secondary | ICD-10-CM | POA: Diagnosis not present

## 2018-05-31 DIAGNOSIS — Z4509 Encounter for adjustment and management of other cardiac device: Secondary | ICD-10-CM | POA: Diagnosis not present

## 2018-07-01 DIAGNOSIS — Z4509 Encounter for adjustment and management of other cardiac device: Secondary | ICD-10-CM | POA: Diagnosis not present

## 2018-08-01 DIAGNOSIS — Z4509 Encounter for adjustment and management of other cardiac device: Secondary | ICD-10-CM | POA: Diagnosis not present

## 2018-08-31 DIAGNOSIS — H2513 Age-related nuclear cataract, bilateral: Secondary | ICD-10-CM | POA: Diagnosis not present

## 2018-08-31 DIAGNOSIS — H52203 Unspecified astigmatism, bilateral: Secondary | ICD-10-CM | POA: Diagnosis not present

## 2018-08-31 DIAGNOSIS — H5203 Hypermetropia, bilateral: Secondary | ICD-10-CM | POA: Diagnosis not present

## 2018-09-01 DIAGNOSIS — Z4509 Encounter for adjustment and management of other cardiac device: Secondary | ICD-10-CM | POA: Diagnosis not present

## 2018-09-10 DIAGNOSIS — R69 Illness, unspecified: Secondary | ICD-10-CM | POA: Diagnosis not present

## 2018-09-10 DIAGNOSIS — E782 Mixed hyperlipidemia: Secondary | ICD-10-CM | POA: Diagnosis not present

## 2018-09-10 DIAGNOSIS — I2510 Atherosclerotic heart disease of native coronary artery without angina pectoris: Secondary | ICD-10-CM | POA: Diagnosis not present

## 2018-09-10 DIAGNOSIS — J4520 Mild intermittent asthma, uncomplicated: Secondary | ICD-10-CM | POA: Diagnosis not present

## 2018-09-16 DIAGNOSIS — L237 Allergic contact dermatitis due to plants, except food: Secondary | ICD-10-CM | POA: Diagnosis not present

## 2018-10-02 DIAGNOSIS — Z4509 Encounter for adjustment and management of other cardiac device: Secondary | ICD-10-CM | POA: Diagnosis not present

## 2018-10-08 DIAGNOSIS — H532 Diplopia: Secondary | ICD-10-CM | POA: Diagnosis not present

## 2018-10-08 DIAGNOSIS — H2513 Age-related nuclear cataract, bilateral: Secondary | ICD-10-CM | POA: Diagnosis not present

## 2018-10-08 DIAGNOSIS — H1852 Epithelial (juvenile) corneal dystrophy: Secondary | ICD-10-CM | POA: Diagnosis not present

## 2018-11-02 DIAGNOSIS — M19041 Primary osteoarthritis, right hand: Secondary | ICD-10-CM | POA: Diagnosis not present

## 2018-11-22 DIAGNOSIS — G909 Disorder of the autonomic nervous system, unspecified: Secondary | ICD-10-CM | POA: Diagnosis not present

## 2018-11-22 DIAGNOSIS — N951 Menopausal and female climacteric states: Secondary | ICD-10-CM | POA: Diagnosis not present

## 2018-11-22 DIAGNOSIS — R7303 Prediabetes: Secondary | ICD-10-CM | POA: Diagnosis not present

## 2018-11-22 DIAGNOSIS — K219 Gastro-esophageal reflux disease without esophagitis: Secondary | ICD-10-CM | POA: Diagnosis not present

## 2018-11-22 DIAGNOSIS — G894 Chronic pain syndrome: Secondary | ICD-10-CM | POA: Diagnosis not present

## 2018-11-22 DIAGNOSIS — E782 Mixed hyperlipidemia: Secondary | ICD-10-CM | POA: Diagnosis not present

## 2018-12-28 DIAGNOSIS — Z4509 Encounter for adjustment and management of other cardiac device: Secondary | ICD-10-CM | POA: Diagnosis not present

## 2019-01-06 DIAGNOSIS — M1811 Unilateral primary osteoarthritis of first carpometacarpal joint, right hand: Secondary | ICD-10-CM | POA: Diagnosis not present

## 2019-01-06 DIAGNOSIS — M19041 Primary osteoarthritis, right hand: Secondary | ICD-10-CM | POA: Diagnosis not present

## 2019-01-24 DIAGNOSIS — I252 Old myocardial infarction: Secondary | ICD-10-CM | POA: Diagnosis not present

## 2019-01-24 DIAGNOSIS — Z7982 Long term (current) use of aspirin: Secondary | ICD-10-CM | POA: Diagnosis not present

## 2019-01-24 DIAGNOSIS — E785 Hyperlipidemia, unspecified: Secondary | ICD-10-CM | POA: Diagnosis not present

## 2019-01-24 DIAGNOSIS — I2510 Atherosclerotic heart disease of native coronary artery without angina pectoris: Secondary | ICD-10-CM | POA: Diagnosis not present

## 2019-01-24 DIAGNOSIS — I950 Idiopathic hypotension: Secondary | ICD-10-CM | POA: Diagnosis not present

## 2019-01-24 DIAGNOSIS — Z955 Presence of coronary angioplasty implant and graft: Secondary | ICD-10-CM | POA: Diagnosis not present

## 2019-02-03 DIAGNOSIS — Z4509 Encounter for adjustment and management of other cardiac device: Secondary | ICD-10-CM | POA: Diagnosis not present

## 2019-04-28 DIAGNOSIS — M19041 Primary osteoarthritis, right hand: Secondary | ICD-10-CM | POA: Diagnosis not present

## 2019-04-28 DIAGNOSIS — M1811 Unilateral primary osteoarthritis of first carpometacarpal joint, right hand: Secondary | ICD-10-CM | POA: Diagnosis not present

## 2019-05-10 DIAGNOSIS — R69 Illness, unspecified: Secondary | ICD-10-CM | POA: Diagnosis not present

## 2019-05-16 DIAGNOSIS — L570 Actinic keratosis: Secondary | ICD-10-CM | POA: Diagnosis not present

## 2019-05-16 DIAGNOSIS — L814 Other melanin hyperpigmentation: Secondary | ICD-10-CM | POA: Diagnosis not present

## 2019-05-16 DIAGNOSIS — D485 Neoplasm of uncertain behavior of skin: Secondary | ICD-10-CM | POA: Diagnosis not present

## 2019-05-16 DIAGNOSIS — D1801 Hemangioma of skin and subcutaneous tissue: Secondary | ICD-10-CM | POA: Diagnosis not present

## 2019-05-16 DIAGNOSIS — C44612 Basal cell carcinoma of skin of right upper limb, including shoulder: Secondary | ICD-10-CM | POA: Diagnosis not present

## 2019-05-16 DIAGNOSIS — L821 Other seborrheic keratosis: Secondary | ICD-10-CM | POA: Diagnosis not present

## 2019-05-16 DIAGNOSIS — L738 Other specified follicular disorders: Secondary | ICD-10-CM | POA: Diagnosis not present

## 2019-05-19 DIAGNOSIS — C44612 Basal cell carcinoma of skin of right upper limb, including shoulder: Secondary | ICD-10-CM | POA: Diagnosis not present

## 2019-05-19 DIAGNOSIS — D0471 Carcinoma in situ of skin of right lower limb, including hip: Secondary | ICD-10-CM | POA: Diagnosis not present

## 2019-05-23 DIAGNOSIS — E042 Nontoxic multinodular goiter: Secondary | ICD-10-CM | POA: Diagnosis not present

## 2019-05-23 DIAGNOSIS — G894 Chronic pain syndrome: Secondary | ICD-10-CM | POA: Diagnosis not present

## 2019-05-23 DIAGNOSIS — J4520 Mild intermittent asthma, uncomplicated: Secondary | ICD-10-CM | POA: Diagnosis not present

## 2019-05-23 DIAGNOSIS — E2839 Other primary ovarian failure: Secondary | ICD-10-CM | POA: Diagnosis not present

## 2019-05-23 DIAGNOSIS — R7303 Prediabetes: Secondary | ICD-10-CM | POA: Diagnosis not present

## 2019-05-23 DIAGNOSIS — Z1231 Encounter for screening mammogram for malignant neoplasm of breast: Secondary | ICD-10-CM | POA: Diagnosis not present

## 2019-05-23 DIAGNOSIS — E782 Mixed hyperlipidemia: Secondary | ICD-10-CM | POA: Diagnosis not present

## 2019-06-07 DIAGNOSIS — R002 Palpitations: Secondary | ICD-10-CM | POA: Diagnosis not present

## 2019-08-22 DIAGNOSIS — R002 Palpitations: Secondary | ICD-10-CM | POA: Diagnosis not present

## 2019-09-01 DIAGNOSIS — E782 Mixed hyperlipidemia: Secondary | ICD-10-CM | POA: Diagnosis not present

## 2019-09-01 DIAGNOSIS — I2510 Atherosclerotic heart disease of native coronary artery without angina pectoris: Secondary | ICD-10-CM | POA: Diagnosis not present

## 2019-09-01 DIAGNOSIS — J4520 Mild intermittent asthma, uncomplicated: Secondary | ICD-10-CM | POA: Diagnosis not present

## 2019-09-01 DIAGNOSIS — J45909 Unspecified asthma, uncomplicated: Secondary | ICD-10-CM | POA: Diagnosis not present

## 2019-09-01 DIAGNOSIS — R69 Illness, unspecified: Secondary | ICD-10-CM | POA: Diagnosis not present

## 2019-10-31 DIAGNOSIS — E782 Mixed hyperlipidemia: Secondary | ICD-10-CM | POA: Diagnosis not present

## 2019-10-31 DIAGNOSIS — I2510 Atherosclerotic heart disease of native coronary artery without angina pectoris: Secondary | ICD-10-CM | POA: Diagnosis not present

## 2019-10-31 DIAGNOSIS — J45909 Unspecified asthma, uncomplicated: Secondary | ICD-10-CM | POA: Diagnosis not present

## 2019-10-31 DIAGNOSIS — J4520 Mild intermittent asthma, uncomplicated: Secondary | ICD-10-CM | POA: Diagnosis not present

## 2019-10-31 DIAGNOSIS — R69 Illness, unspecified: Secondary | ICD-10-CM | POA: Diagnosis not present

## 2019-11-11 DIAGNOSIS — R002 Palpitations: Secondary | ICD-10-CM | POA: Diagnosis not present

## 2019-11-11 DIAGNOSIS — M1811 Unilateral primary osteoarthritis of first carpometacarpal joint, right hand: Secondary | ICD-10-CM | POA: Diagnosis not present

## 2019-11-11 DIAGNOSIS — M19041 Primary osteoarthritis, right hand: Secondary | ICD-10-CM | POA: Diagnosis not present

## 2019-11-21 DIAGNOSIS — R7303 Prediabetes: Secondary | ICD-10-CM | POA: Diagnosis not present

## 2019-11-21 DIAGNOSIS — R5383 Other fatigue: Secondary | ICD-10-CM | POA: Diagnosis not present

## 2019-11-21 DIAGNOSIS — J4520 Mild intermittent asthma, uncomplicated: Secondary | ICD-10-CM | POA: Diagnosis not present

## 2019-11-21 DIAGNOSIS — G894 Chronic pain syndrome: Secondary | ICD-10-CM | POA: Diagnosis not present

## 2019-11-21 DIAGNOSIS — E042 Nontoxic multinodular goiter: Secondary | ICD-10-CM | POA: Diagnosis not present

## 2019-11-21 DIAGNOSIS — R7309 Other abnormal glucose: Secondary | ICD-10-CM | POA: Diagnosis not present

## 2019-11-23 DIAGNOSIS — E782 Mixed hyperlipidemia: Secondary | ICD-10-CM | POA: Diagnosis not present

## 2019-11-23 DIAGNOSIS — J4520 Mild intermittent asthma, uncomplicated: Secondary | ICD-10-CM | POA: Diagnosis not present

## 2019-11-23 DIAGNOSIS — J45909 Unspecified asthma, uncomplicated: Secondary | ICD-10-CM | POA: Diagnosis not present

## 2019-11-23 DIAGNOSIS — R69 Illness, unspecified: Secondary | ICD-10-CM | POA: Diagnosis not present

## 2019-11-23 DIAGNOSIS — I2510 Atherosclerotic heart disease of native coronary artery without angina pectoris: Secondary | ICD-10-CM | POA: Diagnosis not present

## 2019-11-30 DIAGNOSIS — R69 Illness, unspecified: Secondary | ICD-10-CM | POA: Diagnosis not present

## 2019-12-16 DIAGNOSIS — R002 Palpitations: Secondary | ICD-10-CM | POA: Diagnosis not present

## 2020-02-09 DIAGNOSIS — M791 Myalgia, unspecified site: Secondary | ICD-10-CM | POA: Diagnosis not present

## 2020-02-09 DIAGNOSIS — Z6823 Body mass index (BMI) 23.0-23.9, adult: Secondary | ICD-10-CM | POA: Diagnosis not present

## 2020-02-09 DIAGNOSIS — M4326 Fusion of spine, lumbar region: Secondary | ICD-10-CM | POA: Diagnosis not present

## 2020-02-09 DIAGNOSIS — M47896 Other spondylosis, lumbar region: Secondary | ICD-10-CM | POA: Diagnosis not present

## 2020-02-14 DIAGNOSIS — Z4509 Encounter for adjustment and management of other cardiac device: Secondary | ICD-10-CM | POA: Diagnosis not present

## 2020-02-15 DIAGNOSIS — R69 Illness, unspecified: Secondary | ICD-10-CM | POA: Diagnosis not present

## 2020-02-15 DIAGNOSIS — E782 Mixed hyperlipidemia: Secondary | ICD-10-CM | POA: Diagnosis not present

## 2020-02-15 DIAGNOSIS — J45909 Unspecified asthma, uncomplicated: Secondary | ICD-10-CM | POA: Diagnosis not present

## 2020-02-15 DIAGNOSIS — K219 Gastro-esophageal reflux disease without esophagitis: Secondary | ICD-10-CM | POA: Diagnosis not present

## 2020-02-15 DIAGNOSIS — I2510 Atherosclerotic heart disease of native coronary artery without angina pectoris: Secondary | ICD-10-CM | POA: Diagnosis not present

## 2020-02-15 DIAGNOSIS — J4520 Mild intermittent asthma, uncomplicated: Secondary | ICD-10-CM | POA: Diagnosis not present

## 2020-02-23 DIAGNOSIS — S335XXD Sprain of ligaments of lumbar spine, subsequent encounter: Secondary | ICD-10-CM | POA: Diagnosis not present

## 2020-02-23 DIAGNOSIS — M5416 Radiculopathy, lumbar region: Secondary | ICD-10-CM | POA: Diagnosis not present

## 2020-02-26 ENCOUNTER — Encounter (HOSPITAL_COMMUNITY): Payer: Self-pay | Admitting: Emergency Medicine

## 2020-02-26 ENCOUNTER — Emergency Department (HOSPITAL_COMMUNITY): Payer: Medicare HMO

## 2020-02-26 ENCOUNTER — Other Ambulatory Visit: Payer: Self-pay

## 2020-02-26 ENCOUNTER — Emergency Department (HOSPITAL_COMMUNITY)
Admission: EM | Admit: 2020-02-26 | Discharge: 2020-02-27 | Disposition: A | Discharge status: Home / Self Care | Payer: Medicare HMO | Attending: Emergency Medicine | Admitting: Emergency Medicine

## 2020-02-26 DIAGNOSIS — W19XXXA Unspecified fall, initial encounter: Secondary | ICD-10-CM | POA: Diagnosis not present

## 2020-02-26 DIAGNOSIS — G544 Lumbosacral root disorders, not elsewhere classified: Secondary | ICD-10-CM | POA: Diagnosis not present

## 2020-02-26 DIAGNOSIS — M5416 Radiculopathy, lumbar region: Secondary | ICD-10-CM

## 2020-02-26 DIAGNOSIS — I2510 Atherosclerotic heart disease of native coronary artery without angina pectoris: Secondary | ICD-10-CM | POA: Diagnosis not present

## 2020-02-26 DIAGNOSIS — M5126 Other intervertebral disc displacement, lumbar region: Secondary | ICD-10-CM | POA: Diagnosis not present

## 2020-02-26 DIAGNOSIS — Z87891 Personal history of nicotine dependence: Secondary | ICD-10-CM | POA: Insufficient documentation

## 2020-02-26 DIAGNOSIS — Z7982 Long term (current) use of aspirin: Secondary | ICD-10-CM | POA: Diagnosis not present

## 2020-02-26 DIAGNOSIS — M545 Low back pain, unspecified: Secondary | ICD-10-CM | POA: Diagnosis not present

## 2020-02-26 DIAGNOSIS — I10 Essential (primary) hypertension: Secondary | ICD-10-CM | POA: Diagnosis not present

## 2020-02-26 DIAGNOSIS — R030 Elevated blood-pressure reading, without diagnosis of hypertension: Secondary | ICD-10-CM | POA: Diagnosis not present

## 2020-02-26 DIAGNOSIS — E876 Hypokalemia: Secondary | ICD-10-CM

## 2020-02-26 DIAGNOSIS — M25552 Pain in left hip: Secondary | ICD-10-CM | POA: Insufficient documentation

## 2020-02-26 DIAGNOSIS — I700 Atherosclerosis of aorta: Secondary | ICD-10-CM | POA: Diagnosis not present

## 2020-02-26 DIAGNOSIS — Z981 Arthrodesis status: Secondary | ICD-10-CM | POA: Diagnosis not present

## 2020-02-26 DIAGNOSIS — R52 Pain, unspecified: Secondary | ICD-10-CM | POA: Diagnosis not present

## 2020-02-26 LAB — BASIC METABOLIC PANEL
Anion gap: 9 (ref 5–15)
BUN: 8 mg/dL (ref 8–23)
CO2: 26 mmol/L (ref 22–32)
Calcium: 9 mg/dL (ref 8.9–10.3)
Chloride: 101 mmol/L (ref 98–111)
Creatinine, Ser: 0.59 mg/dL (ref 0.44–1.00)
GFR, Estimated: >60 mL/min (ref >60)
Glucose, Bld: 128 mg/dL — ABNORMAL HIGH (ref 70–99)
Potassium: 3.4 mmol/L — ABNORMAL LOW (ref 3.5–5.1)
Sodium: 136 mmol/L (ref 135–145)

## 2020-02-26 LAB — CBC
HCT: 43.3 % (ref 36.0–46.0)
Hemoglobin: 14.7 g/dL (ref 12.0–15.0)
MCH: 31 pg (ref 26.0–34.0)
MCHC: 33.9 g/dL (ref 30.0–36.0)
MCV: 91.4 fL (ref 80.0–100.0)
Platelets: 292 10*3/uL (ref 150–400)
RBC: 4.74 MIL/uL (ref 3.87–5.11)
RDW: 13 % (ref 11.5–15.5)
WBC: 10.7 10*3/uL — ABNORMAL HIGH (ref 4.0–10.5)
nRBC: 0 % (ref 0.0–0.2)

## 2020-02-26 MED ORDER — KETOROLAC TROMETHAMINE 15 MG/ML IJ SOLN
15.0000 mg | Freq: Once | INTRAMUSCULAR | Status: AC
  Administered 2020-02-27: 15 mg via INTRAVENOUS
  Filled 2020-02-26: qty 1

## 2020-02-26 MED ORDER — MORPHINE SULFATE (PF) 2 MG/ML IV SOLN
2.0000 mg | Freq: Once | INTRAVENOUS | Status: AC
  Administered 2020-02-27: 2 mg via INTRAVENOUS
  Filled 2020-02-26: qty 1

## 2020-02-26 MED ORDER — MORPHINE SULFATE (PF) 2 MG/ML IV SOLN
2.0000 mg | Freq: Once | INTRAVENOUS | Status: AC
  Administered 2020-02-26: 2 mg via INTRAVENOUS
  Filled 2020-02-26: qty 1

## 2020-02-26 MED ORDER — SODIUM CHLORIDE 0.9 % IV BOLUS
1000.0000 mL | Freq: Once | INTRAVENOUS | Status: AC
  Administered 2020-02-26: 1000 mL via INTRAVENOUS

## 2020-02-26 MED ORDER — POTASSIUM CHLORIDE CRYS ER 20 MEQ PO TBCR
40.0000 meq | EXTENDED_RELEASE_TABLET | Freq: Once | ORAL | Status: AC
  Administered 2020-02-27: 40 meq via ORAL
  Filled 2020-02-26: qty 2

## 2020-02-26 NOTE — ED Notes (Signed)
Patient transported to CT 

## 2020-02-26 NOTE — ED Provider Notes (Signed)
[facility] EMERGENCY DEPARTMENT Provider Note   CSN: [id number] Arrival date & time: 02/26/20  1556     History Chief Complaint  Patient presents with  . Hip Pain    Briana Bowers is a 69 y.o. female.  Patient c/o pain to left hip area. States pain constant, dull, left hip anterior, lateral, and posterior, occasionally radiating down leg as far as knee. Hx ddd, with remote hx lumbar fusion. No saddle area numbness. No urinary retention or bowel incontinence. Occasionally numb/tingly sensation left hip radiating to knee. No leg swelling. No skin changes, lesions, or rash in area of pain. States ~ 3 weeks ago did have trip on curb and fall - states pain seemed to get worse after that fall. No head injury w fall or loc. No headache. No neck or back pain. Denies other pain or injury. No fever or chills. Pt indicates due to pain, decreased appetite, decreased po intake in past couple weeks and feels as if may be dehydrated. Denies other symptoms. No abd pain or nvd. No dysuria or gu c/o. No cough or uri symptoms.   The history is provided by the patient and a relative.  Hip Pain Pertinent negatives include no chest pain, no abdominal pain, no headaches and no shortness of breath.       Past Medical History:  Diagnosis Date  . Anxiety disorder 12/27/2012  . Arthritis    Degenerative joint disease  . CAD (coronary artery disease), native coronary artery 12/27/2012   a. NSTEMI/RCA stent 1999. b. LAD stent 2014. c. Neg nuc 11/2014.  Marland Kitchen Chronic fatigue 12/27/2012  . Chronic lower back pain   . Gastroesophageal reflux disease 12/27/2012  . H/O hiatal hernia   . History of blood transfusion    w/tonsillectomy; w/spinal fusion  . Hyperlipidemia   . Hypotension    a. prompting holding of BB.  Marland Kitchen Myocardial infarction [facility]) 08/02/1997    Patient Active Problem List   Diagnosis Date Noted  . Chest pain, unspecified   . CAD (coronary artery disease), native coronary artery  12/27/2012    Class: Chronic  . Hyperlipidemia 12/27/2012    Class: Chronic  . Degenerative joint disease 12/27/2012  . Prediabetes 12/27/2012    Class: Chronic  . Gastroesophageal reflux disease 12/27/2012    Class: Chronic  . Anxiety disorder 12/27/2012    Class: Chronic  . Chronic fatigue 12/27/2012    Class: Chronic    Past Surgical History:  Procedure Laterality Date  . APPENDECTOMY  1965  . BACK SURGERY    . BILATERAL SALPINGOOPHORECTOMY Bilateral 1978   "2 separate ORs; months apart" (12/27/2012)  . CARDIAC CATHETERIZATION  2000's   "couple times" (12/27/2012)  . CHOLECYSTECTOMY  08/1989  . CORONARY ANGIOPLASTY WITH STENT PLACEMENT  08/03/1997; 12/28/2012   1 + 1" (01/19/2013)  . DILATION AND CURETTAGE OF UTERUS  09/1967   "after miscarriage" (12/27/2012)  . FINGER ARTHROPLASTY Right 12/232011   "ring finger; fused it; put pin in it; 2nd joint" (12/27/2012)  . FRACTIONAL FLOW RESERVE WIRE  12/28/2012   Procedure: FRACTIONAL FLOW RESERVE WIRE;  Surgeon: Sinclair Grooms, MD;  Location: [facility] CATH LAB;  Service: Cardiovascular;;  PROX LAD  . GANGLION CYST EXCISION Right 1990's  . 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 HERNIA REPAIR  02/2002   "ventral" (12/27/2012)  . LEFT HEART CATHETERIZATION WITH CORONARY ANGIOGRAM N/A  12/28/2012   Procedure: LEFT HEART CATHETERIZATION WITH CORONARY ANGIOGRAM;  Surgeon: Sinclair Grooms, MD;  Location: [facility] CATH LAB;  Service: Cardiovascular;  Laterality: N/A;  . LEFT HEART CATHETERIZATION WITH CORONARY ANGIOGRAM N/A 01/20/2013   Procedure: LEFT HEART CATHETERIZATION WITH CORONARY ANGIOGRAM;  Surgeon: Blane Ohara, MD;  Location: [facility] CATH LAB;  Service: Cardiovascular;  Laterality: N/A;  . LIGAMENT REPAIR Right 1990's   "radial collateral" (12/27/2012)  . [place]; 1990's   "twice" (12/27/2012)  . MASS EXCISION Left 1990's   "groin" (12/27/2012)  . NASAL SEPTUM  SURGERY  1969  . PARTIAL HYSTERECTOMY  1974  . PERCUTANEOUS CORONARY STENT INTERVENTION (PCI-S)  12/28/2012   Procedure: PERCUTANEOUS CORONARY STENT INTERVENTION (PCI-S);  Surgeon: Sinclair Grooms, MD;  Location: [facility] CATH LAB;  Service: Cardiovascular;;  Prox LAD  . POSTERIOR LUMBAR FUSION  04/2002  . TONSILLECTOMY  1964  . TYMPANOPLASTY Bilateral 1966; 2001   "left; right" (12/27/2012)     OB History   No obstetric history on file.     Family History  Problem Relation Age of Onset  . Coronary artery disease Mother   . Coronary artery disease Father   . Other Daughter        TBI  . Other Son        Suicide    Social History   Tobacco Use  . Smoking status: Former Smoker    Packs/day: 1.00    Years: 17.00    Pack years: 17.00    Types: Cigarettes    Quit date: 12/27/1981    Years since quitting: 38.1  . Smokeless tobacco: Never Used  Substance Use Topics  . Alcohol use: No    Alcohol/week: 0.0 standard drinks  . Drug use: No    Home Medications Prior to Admission medications   Medication Sig Start Date End Date Taking? Authorizing Provider  acidophilus (RISAQUAD) CAPS capsule Take 2 capsules by mouth daily.     [provider]  albuterol (PROAIR HFA) 108 (90 BASE) MCG/ACT inhaler Inhale 2 puffs into the lungs every 6 (six) hours as needed for wheezing or shortness of breath.    [provider]  aspirin EC 325 MG tablet Take 1 tablet (325 mg total) by mouth daily. 01/03/14   Jettie Booze, MD  b complex vitamins capsule Take 1 capsule by mouth daily.    [provider]  Budesonide-Formoterol Fumarate (SYMBICORT IN) Inhale 1 puff into the lungs 2 (two) times daily as needed (shortness of breath).    [provider]  Cholecalciferol (VITAMIN D3 PO) Take 1 tablet by mouth daily.    [provider]  Coenzyme Q10 (CO Q 10 PO) Take 1 tablet by mouth daily.    [provider]  estrogen-methylTESTOSTERone (ESTRATEST)  1.25-2.5 MG per tablet Take 1 tablet by mouth at bedtime.    [provider]  fish oil-omega-3 fatty acids 1000 MG capsule Take 1 g by mouth daily.    [provider]  fluticasone (FLONASE) 50 MCG/ACT nasal spray Place 2 sprays into both nostrils daily as needed for allergies or rhinitis.    [provider]  HYDROcodone-acetaminophen (NORCO/VICODIN) 5-325 MG per tablet Take 1 tablet by mouth every 6 (six) hours as needed for moderate pain. 03/31/14   Margarita Mail, PA-C  metaxalone (SKELAXIN) 800 MG tablet Take 400-800 mg by mouth 2 (two) times daily as needed for muscle spasms.     [provider]  nortriptyline (PAMELOR) 50 MG capsule Take 50 mg by mouth at bedtime.    [provider]  pantoprazole (PROTONIX) 40 MG tablet Take 40 mg by mouth 2 (two) times daily.    [provider]  rosuvastatin (CRESTOR) 20 MG tablet Take 20 mg by mouth.    [provider]    Allergies    Atorvastatin, Ciprofloxacin, Levaquin [levofloxacin in d5w], and Dilaudid [hydromorphone hcl]  Review of Systems   Review of Systems  Constitutional: Negative for chills and fever.  HENT: Negative for sore throat.   Eyes: Negative for redness.  Respiratory: Negative for cough and shortness of breath.   Cardiovascular: Negative for chest pain.  Gastrointestinal: Negative for abdominal pain, diarrhea and vomiting.  Genitourinary: Negative for dysuria and flank pain.  Musculoskeletal: Negative for back pain and neck pain.  Skin: Negative for rash.  Neurological: Negative for weakness and headaches.  Hematological: Does not bruise/bleed easily.  Psychiatric/Behavioral: Negative for confusion.    Physical Exam Updated Vital Signs BP (!) 170/76 (BP Location: Right Arm)   Pulse 100   Temp 98.3 F (36.8 C) (Oral)   Resp 13   Ht 1.575 m (5\' 2" )   Wt 60.3 kg   SpO2 96%   BMI 24.33 kg/m   Physical Exam Vitals and nursing note reviewed.    Constitutional:      Appearance: Normal appearance. She is well-developed.  HENT:     Head: Atraumatic.     Nose: Nose normal.     Mouth/Throat:     Mouth: Mucous membranes are moist.  Eyes:     General: No scleral icterus.    Conjunctiva/sclera: Conjunctivae normal.  Neck:     Trachea: No tracheal deviation.  Cardiovascular:     Rate and Rhythm: Normal rate and regular rhythm.     Pulses: Normal pulses.     Heart sounds: Normal heart sounds. No murmur heard.  No friction rub. No gallop.   Pulmonary:     Effort: Pulmonary effort is normal. No respiratory distress.     Breath sounds: Normal breath sounds.  Abdominal:     General: Bowel sounds are normal. There is no distension.     Palpations: Abdomen is soft.     Tenderness: There is no abdominal tenderness. There is no guarding.     Comments: No puls mass.   Genitourinary:    Comments: No cva tenderness.  Musculoskeletal:        General: No swelling.     Cervical back: Normal range of motion and neck supple. No rigidity. No muscular tenderness.     Comments: Tenderness left hip laterally and sciatic notch area. CTLS spine, non tender, aligned, no step off. Good passive rom left hip and knee without pain. Distal pulses palp. No leg swelling.   Skin:    General: Skin is warm and dry.     Findings: No rash.     Comments: No skin lesions, shingles or rash in area of pain.   Neurological:     Mental Status: She is alert.     Comments: Alert, speech normal. LLE motor/sens intact.   Psychiatric:        Mood and Affect: Mood normal.     ED Results / Procedures / Treatments   Labs (all labs ordered are listed, but only abnormal results are displayed) Results for orders placed or performed during the hospital encounter of 62/69/48  Basic metabolic panel  Result Value Ref Range  Sodium 136 135 - 145 mmol/L   Potassium 3.4 (L) 3.5 - 5.1 mmol/L   Chloride 101 98 - 111 mmol/L   CO2 26 22 - 32 mmol/L   Glucose, Bld 128 (H)  70 - 99 mg/dL   BUN 8 8 - 23 mg/dL   Creatinine, Ser 0.59 0.44 - 1.00 mg/dL   Calcium 9.0 8.9 - 10.3 mg/dL   GFR, Estimated >60 >60 mL/min   Anion gap 9 5 - 15  CBC  Result Value Ref Range   WBC 10.7 (H) 4.0 - 10.5 K/uL   RBC 4.74 3.87 - 5.11 MIL/uL   Hemoglobin 14.7 12.0 - 15.0 g/dL   HCT 43.3 36 - 46 %   MCV 91.4 80.0 - 100.0 fL   MCH 31.0 26.0 - 34.0 pg   MCHC 33.9 30.0 - 36.0 g/dL   RDW 13.0 11.5 - 15.5 %   Platelets 292 150 - 400 K/uL   nRBC 0.0 0.0 - 0.2 %   CT Lumbar Spine Wo Contrast  Result Date: 02/26/2020 CLINICAL DATA:  Left hip pain.  Recent fall. EXAM: CT LUMBAR SPINE WITHOUT CONTRAST TECHNIQUE: Multidetector CT imaging of the lumbar spine was performed without intravenous contrast administration. Multiplanar CT image reconstructions were also generated. COMPARISON:  03/19/2012 lumbar spine MRI FINDINGS: Segmentation: 5 lumbar type vertebrae. Alignment: Grade 1 retrolisthesis at L1-2 and L3-4 Vertebrae: L4-S1 PLIF. Paraspinal and other soft tissues: Calcific aortic atherosclerosis. Disc levels: T11-12: Disc space narrowing without spinal canal or neural foraminal stenosis. T12-L1: Unremarkable. L1-L2: Hyperdense material extending from the left subarticular zone inferiorly to the infrapedicle level at L2, likely a large disc extrusion (series 5, image 49). Severe narrowing of the left lateral recess with mild central spinal canal stenosis. No neural foraminal stenosis. L2-L3: Disc space narrowing with moderate facet hypertrophy. No spinal canal stenosis. No neural foraminal stenosis. L3-L4: Disc space narrowing and facet hypertrophy. No spinal canal stenosis. No neural foraminal stenosis. L4-L5: PLIF. No spinal canal stenosis. No neural foraminal stenosis. L5-S1: PLIF. No spinal canal stenosis. No neural foraminal stenosis. Visualized sacrum: Normal. IMPRESSION: 1. 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 MRI  recommended for better characterization. 2. L4-S1 PLIF without spinal canal stenosis or neural foraminal stenosis. Aortic Atherosclerosis (ICD10-I70.0). Electronically Signed   By: Ulyses Jarred M.D.   On: 02/26/2020 22:48   CT Hip Left Wo Contrast  Result Date: 02/26/2020 CLINICAL DATA:  Hip pain.  Stress fracture suspected. EXAM: CT OF THE LEFT HIP WITHOUT CONTRAST TECHNIQUE: Multidetector CT imaging of the left hip was performed according to the standard protocol. Multiplanar CT image reconstructions were also generated. COMPARISON:  February 26, 2020. FINDINGS: Bones/Joint/Cartilage There is no acute displaced fracture or dislocation. Posterior fusion hardware is noted involving the lumbar spine. Ligaments Suboptimally assessed by CT. Muscles and Tendons There is no muscular abnormality. Soft tissues There is scattered colonic diverticula involving the visualized portions of the colon without CT evidence for diverticulitis. There are atherosclerotic changes of the abdominal aorta. The visualized portions of the patient's urinary bladder is unremarkable. IMPRESSION: 1. No definite acute displaced fracture or dislocation. If there is high clinical suspicion for a stress fracture or insufficiency fracture, follow-up with MRI is recommended. 2.  Aortic Atherosclerosis (ICD10-I70.0). Electronically Signed   By: Constance Holster M.D.   On: 02/26/2020 22:46   DG Hip Unilat With Pelvis 2-3 Views Left  Result Date: 02/26/2020 CLINICAL DATA:  Fall, low back and left hip pain and weakness. EXAM: DG HIP (WITH OR WITHOUT PELVIS) 2-3V LEFT COMPARISON:  None. FINDINGS: Posterolateral rod and pedicle screw fixation at L4-L5-S1, with posterolateral graft material noted. There is no left pedicle screw at L5. The arcuate lines appear continuous. No SI joint widening. Mild spurring along the SI joints. No pelvic fracture or left hip fracture is identified. IMPRESSION: 1. No acute bony findings. 2. Posterolateral rod and  pedicle screw fixation at L4-L5-S1, with posterolateral graft material noted. Electronically Signed   By: Van Clines M.D.   On: 02/26/2020 17:07     EKG None  Radiology CT Lumbar Spine Wo Contrast  Result Date: 02/26/2020 CLINICAL DATA:  Left hip pain.  Recent fall. EXAM: CT LUMBAR SPINE WITHOUT CONTRAST TECHNIQUE: Multidetector CT imaging of the lumbar spine was performed without intravenous contrast administration. Multiplanar CT image reconstructions were also generated. COMPARISON:  03/19/2012 lumbar spine MRI FINDINGS: Segmentation: 5 lumbar type vertebrae. Alignment: Grade 1 retrolisthesis at L1-2 and L3-4 Vertebrae: L4-S1 PLIF. Paraspinal and other soft tissues: Calcific aortic atherosclerosis. Disc levels: T11-12: Disc space narrowing without spinal canal or neural foraminal stenosis. T12-L1: Unremarkable. L1-L2: Hyperdense material extending from the left subarticular zone inferiorly to the infrapedicle level at L2, likely a large disc extrusion (series 5, image 49). Severe narrowing of the left lateral recess with mild central spinal canal stenosis. No neural foraminal stenosis. L2-L3: Disc space narrowing with moderate facet hypertrophy. No spinal canal stenosis. No neural foraminal stenosis. L3-L4: Disc space narrowing and facet hypertrophy. No spinal canal stenosis. No neural foraminal stenosis. L4-L5: PLIF. No spinal canal stenosis. No neural foraminal stenosis. L5-S1: PLIF. No spinal canal stenosis. No neural foraminal stenosis. Visualized sacrum: Normal. IMPRESSION: 1. 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 MRI recommended for better characterization. 2. L4-S1 PLIF without spinal canal stenosis or neural foraminal stenosis. Aortic Atherosclerosis (ICD10-I70.0). Electronically Signed   By: Ulyses Jarred M.D.   On: 02/26/2020 22:48   CT Hip Left Wo Contrast  Result Date: 02/26/2020 CLINICAL DATA:  Hip pain.   Stress fracture suspected. EXAM: CT OF THE LEFT HIP WITHOUT CONTRAST TECHNIQUE: Multidetector CT imaging of the left hip was performed according to the standard protocol. Multiplanar CT image reconstructions were also generated. COMPARISON:  February 26, 2020. FINDINGS: Bones/Joint/Cartilage There is no acute displaced fracture or dislocation. Posterior fusion hardware is noted involving the lumbar spine. Ligaments Suboptimally assessed by CT. Muscles and Tendons There is no muscular abnormality. Soft tissues There is scattered colonic diverticula involving the visualized portions of the colon without CT evidence for diverticulitis. There are atherosclerotic changes of the abdominal aorta. The visualized portions of the patient's urinary bladder is unremarkable. IMPRESSION: 1. No definite acute displaced fracture or dislocation. If there is high clinical suspicion for a stress fracture or insufficiency fracture, follow-up with MRI is recommended. 2.  Aortic Atherosclerosis (ICD10-I70.0). Electronically Signed   By: Constance Holster M.D.   On: 02/26/2020 22:46   DG Hip Unilat With Pelvis 2-3 Views Left  Result Date: 02/26/2020 CLINICAL DATA:  Fall, low back and left hip pain and weakness. EXAM: DG HIP (WITH OR WITHOUT PELVIS) 2-3V LEFT COMPARISON:  None. FINDINGS: Posterolateral rod and pedicle screw fixation at L4-L5-S1, with posterolateral graft material noted. There is no left pedicle screw at L5. The arcuate lines appear continuous. No SI joint widening. Mild spurring along the SI joints. No pelvic fracture or  left hip fracture is identified. IMPRESSION: 1. No acute bony findings. 2. Posterolateral rod and pedicle screw fixation at L4-L5-S1, with posterolateral graft material noted. Electronically Signed   By: Van Clines M.D.   On: 02/26/2020 17:07    Procedures Procedures (including critical care time)  Medications Ordered in ED Medications  sodium chloride 0.9 % bolus 1,000 mL (has no  administration in time range)  morphine 2 MG/ML injection 2 mg (has no administration in time range)    ED Course  I have reviewed the triage vital signs and the nursing notes.  Pertinent labs & imaging results that were available during my care of the patient were reviewed by me and considered in my medical decision making (see chart for details).    MDM Rules/Calculators/A&P                          Iv ns  Bolus. Labs. Morphine iv. Xrays.   Reviewed nursing notes and prior charts for additional history.   xrays reviewed/interpreted by me - no fx.   CT ordered as persistent pain, numbness.   CT reviewed/interpreted by me - degen changes. No fx. +disc extrusion L1/L2, w compression L2 nerve root. Discussed possible MRI tonight w pt - pt indicates has implanted cardiac device and that she cannot have mri.  Pt indicates her neurosurgeon is Dr Patrice Paradise, and can/will f/u with him. Pt indicates has new rx for prednisone and hydrocodone at home - will continue.   Pain improved w meds. No current numbness/weakness.   Pt up to bedside commode/urinates without difficulty.   Labs reviewed/interpreted by me - chem normal, except k sl low. kcl po.   Pt currently appears stable for d/c.   Rec close NS f/u.  Return precautions provided.     Final Clinical Impression(s) / ED Diagnoses Final diagnoses:  None    Rx / DC Orders ED Discharge Orders    None       Lajean Saver, MD 02/26/20 2348

## 2020-02-26 NOTE — Discharge Instructions (Addendum)
It was our pleasure to provide your ER care today - we hope that you feel better.  Your CT scan shows disc extrusion at L1/L2 with compression of the L2 nerve. Follow up with Dr Patrice Paradise - have him review your imaging.   Your potassium level is slightly low - eat plenty of fruits and vegetables, and follow up with your doctor. Your blood pressure is high tonight - follow up with primary care doctor in the next 1-2 weeks.   Continue prednisone. Take your pain medication as need.   Avoid bending at waist, or heavy lifting > 10 lbs.   Follow up with Dr Patrice Paradise this week - call office tomorrow AM to arrange appointment.   Return to ER if worse, new symptoms, fevers, severe or intractable pain, numbness/weakness, problems with bowel or bladder function, or other concern.

## 2020-02-26 NOTE — ED Notes (Signed)
Pt given ice back. Daughter advised patient does have a Hydrologist.

## 2020-02-26 NOTE — ED Triage Notes (Signed)
Pt BIB [facility] from home, c/o left hip pain following a fall on 10/22. Pt reports increasing pain and difficulty getting around at home. Seen by her physician who prescribed her tylenol.

## 2020-02-27 NOTE — ED Notes (Signed)
Patient verbalizes understanding of discharge instructions. Opportunity for questioning and answers were provided. Armband removed by staff, pt discharged from ED via wheelchair with family member.  

## 2020-02-29 DIAGNOSIS — M47896 Other spondylosis, lumbar region: Secondary | ICD-10-CM | POA: Diagnosis not present

## 2020-02-29 DIAGNOSIS — M4326 Fusion of spine, lumbar region: Secondary | ICD-10-CM | POA: Diagnosis not present

## 2020-03-12 DIAGNOSIS — Z4509 Encounter for adjustment and management of other cardiac device: Secondary | ICD-10-CM | POA: Diagnosis not present

## 2020-03-22 DIAGNOSIS — M5416 Radiculopathy, lumbar region: Secondary | ICD-10-CM | POA: Diagnosis not present

## 2020-03-22 DIAGNOSIS — Z6824 Body mass index (BMI) 24.0-24.9, adult: Secondary | ICD-10-CM | POA: Diagnosis not present

## 2020-03-22 DIAGNOSIS — I10 Essential (primary) hypertension: Secondary | ICD-10-CM | POA: Diagnosis not present

## 2020-03-22 DIAGNOSIS — M4326 Fusion of spine, lumbar region: Secondary | ICD-10-CM | POA: Diagnosis not present

## 2020-04-03 DIAGNOSIS — M5416 Radiculopathy, lumbar region: Secondary | ICD-10-CM | POA: Diagnosis not present

## 2020-04-12 DIAGNOSIS — R002 Palpitations: Secondary | ICD-10-CM | POA: Diagnosis not present

## 2020-04-19 DIAGNOSIS — M5416 Radiculopathy, lumbar region: Secondary | ICD-10-CM | POA: Diagnosis not present

## 2020-04-19 DIAGNOSIS — M4326 Fusion of spine, lumbar region: Secondary | ICD-10-CM | POA: Diagnosis not present

## 2020-04-19 DIAGNOSIS — M1811 Unilateral primary osteoarthritis of first carpometacarpal joint, right hand: Secondary | ICD-10-CM | POA: Diagnosis not present

## 2020-05-08 DIAGNOSIS — M5416 Radiculopathy, lumbar region: Secondary | ICD-10-CM | POA: Diagnosis not present

## 2020-05-13 DIAGNOSIS — Z452 Encounter for adjustment and management of vascular access device: Secondary | ICD-10-CM | POA: Diagnosis not present

## 2020-05-28 DIAGNOSIS — J45909 Unspecified asthma, uncomplicated: Secondary | ICD-10-CM | POA: Diagnosis not present

## 2020-05-28 DIAGNOSIS — G894 Chronic pain syndrome: Secondary | ICD-10-CM | POA: Diagnosis not present

## 2020-05-28 DIAGNOSIS — I2510 Atherosclerotic heart disease of native coronary artery without angina pectoris: Secondary | ICD-10-CM | POA: Diagnosis not present

## 2020-05-28 DIAGNOSIS — E2839 Other primary ovarian failure: Secondary | ICD-10-CM | POA: Diagnosis not present

## 2020-05-28 DIAGNOSIS — K219 Gastro-esophageal reflux disease without esophagitis: Secondary | ICD-10-CM | POA: Diagnosis not present

## 2020-05-28 DIAGNOSIS — G909 Disorder of the autonomic nervous system, unspecified: Secondary | ICD-10-CM | POA: Diagnosis not present

## 2020-05-28 DIAGNOSIS — Z0000 Encounter for general adult medical examination without abnormal findings: Secondary | ICD-10-CM | POA: Diagnosis not present

## 2020-05-28 DIAGNOSIS — Z1231 Encounter for screening mammogram for malignant neoplasm of breast: Secondary | ICD-10-CM | POA: Diagnosis not present

## 2020-05-28 DIAGNOSIS — N951 Menopausal and female climacteric states: Secondary | ICD-10-CM | POA: Diagnosis not present

## 2020-05-28 DIAGNOSIS — R946 Abnormal results of thyroid function studies: Secondary | ICD-10-CM | POA: Diagnosis not present

## 2020-05-28 DIAGNOSIS — R7303 Prediabetes: Secondary | ICD-10-CM | POA: Diagnosis not present

## 2020-05-28 DIAGNOSIS — E782 Mixed hyperlipidemia: Secondary | ICD-10-CM | POA: Diagnosis not present

## 2020-05-30 DIAGNOSIS — G894 Chronic pain syndrome: Secondary | ICD-10-CM | POA: Diagnosis not present

## 2020-05-30 DIAGNOSIS — Z79891 Long term (current) use of opiate analgesic: Secondary | ICD-10-CM | POA: Diagnosis not present

## 2020-05-30 DIAGNOSIS — Z79899 Other long term (current) drug therapy: Secondary | ICD-10-CM | POA: Diagnosis not present

## 2020-05-30 DIAGNOSIS — M5416 Radiculopathy, lumbar region: Secondary | ICD-10-CM | POA: Diagnosis not present

## 2020-05-30 DIAGNOSIS — M4326 Fusion of spine, lumbar region: Secondary | ICD-10-CM | POA: Diagnosis not present

## 2020-06-11 DIAGNOSIS — H04123 Dry eye syndrome of bilateral lacrimal glands: Secondary | ICD-10-CM | POA: Diagnosis not present

## 2020-06-11 DIAGNOSIS — H25813 Combined forms of age-related cataract, bilateral: Secondary | ICD-10-CM | POA: Diagnosis not present

## 2020-06-11 DIAGNOSIS — H1789 Other corneal scars and opacities: Secondary | ICD-10-CM | POA: Diagnosis not present

## 2020-06-11 DIAGNOSIS — H52203 Unspecified astigmatism, bilateral: Secondary | ICD-10-CM | POA: Diagnosis not present

## 2020-06-13 DIAGNOSIS — M5416 Radiculopathy, lumbar region: Secondary | ICD-10-CM | POA: Diagnosis not present

## 2020-06-18 ENCOUNTER — Other Ambulatory Visit: Payer: Self-pay | Admitting: Family Medicine

## 2020-06-18 DIAGNOSIS — E2839 Other primary ovarian failure: Secondary | ICD-10-CM

## 2020-06-18 DIAGNOSIS — Z1231 Encounter for screening mammogram for malignant neoplasm of breast: Secondary | ICD-10-CM

## 2020-07-03 DIAGNOSIS — Z955 Presence of coronary angioplasty implant and graft: Secondary | ICD-10-CM | POA: Diagnosis not present

## 2020-07-03 DIAGNOSIS — I951 Orthostatic hypotension: Secondary | ICD-10-CM | POA: Diagnosis not present

## 2020-07-03 DIAGNOSIS — Z4509 Encounter for adjustment and management of other cardiac device: Secondary | ICD-10-CM | POA: Diagnosis not present

## 2020-07-03 DIAGNOSIS — I950 Idiopathic hypotension: Secondary | ICD-10-CM | POA: Diagnosis not present

## 2020-07-03 DIAGNOSIS — I2510 Atherosclerotic heart disease of native coronary artery without angina pectoris: Secondary | ICD-10-CM | POA: Diagnosis not present

## 2020-07-12 DIAGNOSIS — T829XXA Unspecified complication of cardiac and vascular prosthetic device, implant and graft, initial encounter: Secondary | ICD-10-CM | POA: Diagnosis not present

## 2020-07-12 DIAGNOSIS — I950 Idiopathic hypotension: Secondary | ICD-10-CM | POA: Diagnosis not present

## 2020-07-12 DIAGNOSIS — Z4509 Encounter for adjustment and management of other cardiac device: Secondary | ICD-10-CM | POA: Diagnosis not present

## 2020-07-12 DIAGNOSIS — I2510 Atherosclerotic heart disease of native coronary artery without angina pectoris: Secondary | ICD-10-CM | POA: Diagnosis not present

## 2020-07-17 DIAGNOSIS — Z8616 Personal history of COVID-19: Secondary | ICD-10-CM

## 2020-07-17 DIAGNOSIS — J019 Acute sinusitis, unspecified: Secondary | ICD-10-CM | POA: Diagnosis not present

## 2020-07-17 DIAGNOSIS — J4521 Mild intermittent asthma with (acute) exacerbation: Secondary | ICD-10-CM | POA: Diagnosis not present

## 2020-07-17 DIAGNOSIS — U071 COVID-19: Secondary | ICD-10-CM | POA: Diagnosis not present

## 2020-07-17 HISTORY — DX: COVID-19: U07.1

## 2020-07-17 HISTORY — DX: Personal history of COVID-19: Z86.16

## 2020-07-30 ENCOUNTER — Other Ambulatory Visit: Payer: Self-pay | Admitting: Orthopaedic Surgery

## 2020-07-30 DIAGNOSIS — M5416 Radiculopathy, lumbar region: Secondary | ICD-10-CM

## 2020-08-10 ENCOUNTER — Other Ambulatory Visit: Payer: Self-pay

## 2020-08-10 ENCOUNTER — Ambulatory Visit
Admission: RE | Admit: 2020-08-10 | Discharge: 2020-08-10 | Disposition: A | Discharge status: Home / Self Care | Payer: Medicare HMO | Source: Ambulatory Visit | Attending: Family Medicine | Admitting: Family Medicine

## 2020-08-10 ENCOUNTER — Other Ambulatory Visit: Payer: Self-pay | Admitting: Family Medicine

## 2020-08-10 DIAGNOSIS — R059 Cough, unspecified: Secondary | ICD-10-CM

## 2020-08-10 DIAGNOSIS — R0602 Shortness of breath: Secondary | ICD-10-CM | POA: Diagnosis not present

## 2020-08-14 ENCOUNTER — Other Ambulatory Visit: Payer: Self-pay | Admitting: Orthopaedic Surgery

## 2020-08-14 DIAGNOSIS — M79644 Pain in right finger(s): Secondary | ICD-10-CM | POA: Diagnosis not present

## 2020-08-14 DIAGNOSIS — M1811 Unilateral primary osteoarthritis of first carpometacarpal joint, right hand: Secondary | ICD-10-CM | POA: Diagnosis not present

## 2020-08-14 DIAGNOSIS — M5416 Radiculopathy, lumbar region: Secondary | ICD-10-CM

## 2020-08-17 ENCOUNTER — Other Ambulatory Visit: Payer: Self-pay

## 2020-08-17 ENCOUNTER — Other Ambulatory Visit: Payer: Medicare HMO

## 2020-08-17 ENCOUNTER — Ambulatory Visit
Admission: RE | Admit: 2020-08-17 | Discharge: 2020-08-17 | Disposition: A | Discharge status: Home / Self Care | Payer: Medicare HMO | Source: Ambulatory Visit | Attending: Orthopaedic Surgery | Admitting: Orthopaedic Surgery

## 2020-08-17 DIAGNOSIS — M5126 Other intervertebral disc displacement, lumbar region: Secondary | ICD-10-CM | POA: Diagnosis not present

## 2020-08-17 DIAGNOSIS — M5416 Radiculopathy, lumbar region: Secondary | ICD-10-CM

## 2020-08-17 MED ORDER — GADOBENATE DIMEGLUMINE 529 MG/ML IV SOLN
112.0000 mL | Freq: Once | INTRAVENOUS | Status: AC | PRN
  Administered 2020-08-17: 12 mL via INTRAVENOUS

## 2020-08-21 ENCOUNTER — Other Ambulatory Visit: Payer: Self-pay

## 2020-08-21 ENCOUNTER — Ambulatory Visit: Payer: Medicare HMO

## 2020-08-21 ENCOUNTER — Ambulatory Visit
Admission: RE | Admit: 2020-08-21 | Discharge: 2020-08-21 | Disposition: A | Discharge status: Home / Self Care | Payer: Medicare HMO | Source: Ambulatory Visit | Attending: Family Medicine | Admitting: Family Medicine

## 2020-08-21 DIAGNOSIS — Z1231 Encounter for screening mammogram for malignant neoplasm of breast: Secondary | ICD-10-CM

## 2020-08-22 ENCOUNTER — Other Ambulatory Visit: Payer: Self-pay | Admitting: Family Medicine

## 2020-08-22 DIAGNOSIS — M4716 Other spondylosis with myelopathy, lumbar region: Secondary | ICD-10-CM | POA: Diagnosis not present

## 2020-08-22 DIAGNOSIS — M5416 Radiculopathy, lumbar region: Secondary | ICD-10-CM | POA: Diagnosis not present

## 2020-08-22 DIAGNOSIS — Z6824 Body mass index (BMI) 24.0-24.9, adult: Secondary | ICD-10-CM | POA: Diagnosis not present

## 2020-08-22 DIAGNOSIS — Z981 Arthrodesis status: Secondary | ICD-10-CM | POA: Diagnosis not present

## 2020-08-22 DIAGNOSIS — N6459 Other signs and symptoms in breast: Secondary | ICD-10-CM

## 2020-08-22 DIAGNOSIS — N6452 Nipple discharge: Secondary | ICD-10-CM

## 2020-08-30 DIAGNOSIS — E2839 Other primary ovarian failure: Secondary | ICD-10-CM | POA: Diagnosis not present

## 2020-08-30 DIAGNOSIS — G894 Chronic pain syndrome: Secondary | ICD-10-CM | POA: Diagnosis not present

## 2020-08-30 DIAGNOSIS — E782 Mixed hyperlipidemia: Secondary | ICD-10-CM | POA: Diagnosis not present

## 2020-08-30 DIAGNOSIS — J45909 Unspecified asthma, uncomplicated: Secondary | ICD-10-CM | POA: Diagnosis not present

## 2020-08-30 DIAGNOSIS — R7309 Other abnormal glucose: Secondary | ICD-10-CM | POA: Diagnosis not present

## 2020-09-03 ENCOUNTER — Ambulatory Visit
Admission: RE | Admit: 2020-09-03 | Discharge: 2020-09-03 | Disposition: A | Discharge status: Home / Self Care | Payer: Medicare HMO | Source: Ambulatory Visit | Attending: Family Medicine | Admitting: Family Medicine

## 2020-09-03 ENCOUNTER — Other Ambulatory Visit: Payer: Self-pay

## 2020-09-03 DIAGNOSIS — N6452 Nipple discharge: Secondary | ICD-10-CM

## 2020-09-03 DIAGNOSIS — N6459 Other signs and symptoms in breast: Secondary | ICD-10-CM

## 2020-09-03 DIAGNOSIS — R922 Inconclusive mammogram: Secondary | ICD-10-CM | POA: Diagnosis not present

## 2020-09-05 DIAGNOSIS — M4316 Spondylolisthesis, lumbar region: Secondary | ICD-10-CM | POA: Diagnosis not present

## 2020-09-05 DIAGNOSIS — M5416 Radiculopathy, lumbar region: Secondary | ICD-10-CM | POA: Diagnosis not present

## 2020-09-17 ENCOUNTER — Other Ambulatory Visit: Payer: Medicare HMO

## 2020-09-26 DIAGNOSIS — M4316 Spondylolisthesis, lumbar region: Secondary | ICD-10-CM | POA: Diagnosis not present

## 2020-09-26 DIAGNOSIS — M4716 Other spondylosis with myelopathy, lumbar region: Secondary | ICD-10-CM | POA: Diagnosis not present

## 2020-10-17 DIAGNOSIS — M4716 Other spondylosis with myelopathy, lumbar region: Secondary | ICD-10-CM | POA: Diagnosis not present

## 2020-10-17 DIAGNOSIS — Z6824 Body mass index (BMI) 24.0-24.9, adult: Secondary | ICD-10-CM | POA: Diagnosis not present

## 2020-10-17 DIAGNOSIS — M4316 Spondylolisthesis, lumbar region: Secondary | ICD-10-CM | POA: Diagnosis not present

## 2020-10-22 DIAGNOSIS — G894 Chronic pain syndrome: Secondary | ICD-10-CM | POA: Diagnosis not present

## 2020-10-22 DIAGNOSIS — K439 Ventral hernia without obstruction or gangrene: Secondary | ICD-10-CM | POA: Diagnosis not present

## 2020-11-09 DIAGNOSIS — M4716 Other spondylosis with myelopathy, lumbar region: Secondary | ICD-10-CM | POA: Diagnosis not present

## 2020-11-09 DIAGNOSIS — Z6824 Body mass index (BMI) 24.0-24.9, adult: Secondary | ICD-10-CM | POA: Diagnosis not present

## 2020-11-09 DIAGNOSIS — M4316 Spondylolisthesis, lumbar region: Secondary | ICD-10-CM | POA: Diagnosis not present

## 2020-11-09 DIAGNOSIS — I10 Essential (primary) hypertension: Secondary | ICD-10-CM | POA: Diagnosis not present

## 2020-11-13 DIAGNOSIS — M48061 Spinal stenosis, lumbar region without neurogenic claudication: Secondary | ICD-10-CM | POA: Diagnosis not present

## 2020-11-13 DIAGNOSIS — Z01812 Encounter for preprocedural laboratory examination: Secondary | ICD-10-CM | POA: Diagnosis not present

## 2020-11-15 DIAGNOSIS — M4716 Other spondylosis with myelopathy, lumbar region: Secondary | ICD-10-CM | POA: Diagnosis not present

## 2020-11-15 DIAGNOSIS — M48062 Spinal stenosis, lumbar region with neurogenic claudication: Secondary | ICD-10-CM | POA: Diagnosis not present

## 2020-11-15 DIAGNOSIS — Z4689 Encounter for fitting and adjustment of other specified devices: Secondary | ICD-10-CM | POA: Diagnosis not present

## 2020-11-15 DIAGNOSIS — M4316 Spondylolisthesis, lumbar region: Secondary | ICD-10-CM | POA: Diagnosis not present

## 2020-11-20 DIAGNOSIS — I252 Old myocardial infarction: Secondary | ICD-10-CM | POA: Diagnosis not present

## 2020-11-20 DIAGNOSIS — J45909 Unspecified asthma, uncomplicated: Secondary | ICD-10-CM | POA: Diagnosis not present

## 2020-11-20 DIAGNOSIS — M5106 Intervertebral disc disorders with myelopathy, lumbar region: Secondary | ICD-10-CM | POA: Diagnosis not present

## 2020-11-20 DIAGNOSIS — M48061 Spinal stenosis, lumbar region without neurogenic claudication: Secondary | ICD-10-CM | POA: Diagnosis not present

## 2020-11-20 DIAGNOSIS — M7138 Other bursal cyst, other site: Secondary | ICD-10-CM | POA: Diagnosis not present

## 2020-11-20 DIAGNOSIS — M4716 Other spondylosis with myelopathy, lumbar region: Secondary | ICD-10-CM | POA: Diagnosis not present

## 2020-11-20 DIAGNOSIS — M5116 Intervertebral disc disorders with radiculopathy, lumbar region: Secondary | ICD-10-CM | POA: Diagnosis not present

## 2020-11-20 DIAGNOSIS — M532X6 Spinal instabilities, lumbar region: Secondary | ICD-10-CM | POA: Diagnosis not present

## 2020-11-20 DIAGNOSIS — M4316 Spondylolisthesis, lumbar region: Secondary | ICD-10-CM | POA: Diagnosis not present

## 2020-11-20 DIAGNOSIS — Z981 Arthrodesis status: Secondary | ICD-10-CM | POA: Diagnosis not present

## 2020-11-20 DIAGNOSIS — Z7982 Long term (current) use of aspirin: Secondary | ICD-10-CM | POA: Diagnosis not present

## 2020-11-20 DIAGNOSIS — M48062 Spinal stenosis, lumbar region with neurogenic claudication: Secondary | ICD-10-CM | POA: Diagnosis not present

## 2020-11-20 DIAGNOSIS — M4326 Fusion of spine, lumbar region: Secondary | ICD-10-CM | POA: Diagnosis not present

## 2020-11-21 DIAGNOSIS — Z981 Arthrodesis status: Secondary | ICD-10-CM | POA: Diagnosis not present

## 2020-11-21 DIAGNOSIS — M47816 Spondylosis without myelopathy or radiculopathy, lumbar region: Secondary | ICD-10-CM | POA: Diagnosis not present

## 2020-11-21 DIAGNOSIS — M4326 Fusion of spine, lumbar region: Secondary | ICD-10-CM | POA: Diagnosis not present

## 2020-11-21 DIAGNOSIS — J329 Chronic sinusitis, unspecified: Secondary | ICD-10-CM | POA: Diagnosis not present

## 2020-11-21 DIAGNOSIS — M4316 Spondylolisthesis, lumbar region: Secondary | ICD-10-CM | POA: Diagnosis not present

## 2020-11-21 DIAGNOSIS — R430 Anosmia: Secondary | ICD-10-CM | POA: Diagnosis not present

## 2020-11-21 DIAGNOSIS — Z419 Encounter for procedure for purposes other than remedying health state, unspecified: Secondary | ICD-10-CM | POA: Diagnosis not present

## 2020-12-06 ENCOUNTER — Other Ambulatory Visit: Payer: Medicare HMO

## 2020-12-07 DIAGNOSIS — M4716 Other spondylosis with myelopathy, lumbar region: Secondary | ICD-10-CM | POA: Diagnosis not present

## 2020-12-28 DIAGNOSIS — D508 Other iron deficiency anemias: Secondary | ICD-10-CM | POA: Diagnosis not present

## 2021-01-10 DIAGNOSIS — F324 Major depressive disorder, single episode, in partial remission: Secondary | ICD-10-CM | POA: Diagnosis not present

## 2021-01-10 DIAGNOSIS — I2510 Atherosclerotic heart disease of native coronary artery without angina pectoris: Secondary | ICD-10-CM | POA: Diagnosis not present

## 2021-01-10 DIAGNOSIS — E785 Hyperlipidemia, unspecified: Secondary | ICD-10-CM | POA: Diagnosis not present

## 2021-01-10 DIAGNOSIS — J45909 Unspecified asthma, uncomplicated: Secondary | ICD-10-CM | POA: Diagnosis not present

## 2021-01-10 DIAGNOSIS — G909 Disorder of the autonomic nervous system, unspecified: Secondary | ICD-10-CM | POA: Diagnosis not present

## 2021-01-10 DIAGNOSIS — G8929 Other chronic pain: Secondary | ICD-10-CM | POA: Diagnosis not present

## 2021-01-10 DIAGNOSIS — M1990 Unspecified osteoarthritis, unspecified site: Secondary | ICD-10-CM | POA: Diagnosis not present

## 2021-01-10 DIAGNOSIS — F439 Reaction to severe stress, unspecified: Secondary | ICD-10-CM | POA: Diagnosis not present

## 2021-01-10 DIAGNOSIS — I252 Old myocardial infarction: Secondary | ICD-10-CM | POA: Diagnosis not present

## 2021-01-10 DIAGNOSIS — K5900 Constipation, unspecified: Secondary | ICD-10-CM | POA: Diagnosis not present

## 2021-01-10 DIAGNOSIS — K219 Gastro-esophageal reflux disease without esophagitis: Secondary | ICD-10-CM | POA: Diagnosis not present

## 2021-01-10 DIAGNOSIS — D84821 Immunodeficiency due to drugs: Secondary | ICD-10-CM | POA: Diagnosis not present

## 2021-01-10 DIAGNOSIS — R69 Illness, unspecified: Secondary | ICD-10-CM | POA: Diagnosis not present

## 2021-02-06 DIAGNOSIS — M4716 Other spondylosis with myelopathy, lumbar region: Secondary | ICD-10-CM | POA: Diagnosis not present

## 2021-02-06 DIAGNOSIS — M4326 Fusion of spine, lumbar region: Secondary | ICD-10-CM | POA: Diagnosis not present

## 2021-03-19 DIAGNOSIS — G894 Chronic pain syndrome: Secondary | ICD-10-CM | POA: Diagnosis not present

## 2021-03-19 DIAGNOSIS — R251 Tremor, unspecified: Secondary | ICD-10-CM | POA: Diagnosis not present

## 2021-03-19 DIAGNOSIS — E782 Mixed hyperlipidemia: Secondary | ICD-10-CM | POA: Diagnosis not present

## 2021-03-19 DIAGNOSIS — R7303 Prediabetes: Secondary | ICD-10-CM | POA: Diagnosis not present

## 2021-03-21 ENCOUNTER — Encounter: Payer: Self-pay | Admitting: Neurology

## 2021-03-21 DIAGNOSIS — Z6824 Body mass index (BMI) 24.0-24.9, adult: Secondary | ICD-10-CM | POA: Diagnosis not present

## 2021-03-21 DIAGNOSIS — G894 Chronic pain syndrome: Secondary | ICD-10-CM | POA: Diagnosis not present

## 2021-03-21 DIAGNOSIS — M4716 Other spondylosis with myelopathy, lumbar region: Secondary | ICD-10-CM | POA: Diagnosis not present

## 2021-03-21 DIAGNOSIS — Z79899 Other long term (current) drug therapy: Secondary | ICD-10-CM | POA: Diagnosis not present

## 2021-03-21 DIAGNOSIS — M4326 Fusion of spine, lumbar region: Secondary | ICD-10-CM | POA: Diagnosis not present

## 2021-03-21 DIAGNOSIS — Z79891 Long term (current) use of opiate analgesic: Secondary | ICD-10-CM | POA: Diagnosis not present

## 2021-03-27 NOTE — Progress Notes (Deleted)
Assessment/Plan:   ***  Subjective:   Briana Bowers was seen today in the movement disorders clinic for neurologic consultation at the request of Briana Frees, MD.  The consultation is for the evaluation of right hand intention tremor.  Medical records made available to me have been reviewed.  Patient is a 70 year old female with history of chronic pain, previously having seen Dr. Posey Bowers for complaints of memory change and arm pain, who presents today with right arm intention tremor.  Tremor: {yes no:314532}   How long has it been going on? ***  At rest or with activation?  ***  When is it noted the most?  ***  Fam hx of tremor?  {yes TK:[id number]}  Located where?  ***  Affected by caffeine:  {yes no:314532}  Affected by alcohol:  {yes no:314532}  Affected by stress:  {yes no:314532}  Affected by fatigue:  {yes no:314532}  Spills soup if on spoon:  {yes no:314532}  Spills glass of liquid if full:  {yes no:314532}  Affects ADL's (tying shoes, brushing teeth, etc):  {yes no:314532}  Tremor inducing meds:  {yes no:314532} albuterol  Other Specific Symptoms:  Voice: *** Sleep: ***  Vivid Dreams:  {yes no:314532}  Acting out dreams:  {yes no:314532} Wet Pillows: {yes no:314532} Postural symptoms:  {yes no:314532}  Falls?  {yes no:314532} Bradykinesia symptoms: {parkinson brady:18041} Loss of smell:  {yes no:314532} Loss of taste:  {yes no:314532} Urinary Incontinence:  {yes no:314532} Difficulty Swallowing:  {yes no:314532} Handwriting, micrographia: {yes no:314532} Trouble with ADL's:  {yes no:314532}  Trouble buttoning clothing: {yes no:314532} Depression:  {yes no:314532} Memory changes:  {yes no:314532} Hallucinations:  {yes no:314532}  visual distortions: {yes no:314532} N/V:  {yes no:314532} Lightheaded:  {yes no:314532}  Syncope: {yes no:314532} Diplopia:  {yes no:314532} Dyskinesia:  {yes no:314532}  Neuroimaging of the brain has not previously been performed.    PREVIOUS MEDICATIONS: {Parkinson's RX:18200}  ALLERGIES:   Allergies  Allergen Reactions   Atorvastatin     Muscle soreness   Ciprofloxacin Other (See Comments)    "makes me feel very weak"   Levaquin [Levofloxacin In D5w] Other (See Comments)    "makes me feel very weak"   Dilaudid [Hydromorphone Hcl] Other (See Comments)    "makes me really sleepy"    CURRENT MEDICATIONS:  Current Outpatient Medications  Medication Instructions   acidophilus (RISAQUAD) CAPS capsule 2 capsules, Oral, Daily   albuterol (PROAIR HFA) 108 (90 BASE) MCG/ACT inhaler 2 puffs, Every 6 hours PRN   aspirin EC 325 mg, Oral, Daily   b complex vitamins capsule 1 capsule, Daily   Budesonide-Formoterol Fumarate (SYMBICORT IN) 1 puff, 2 times daily PRN   Cholecalciferol (VITAMIN D3 PO) 1 tablet, Daily   Coenzyme Q10 (CO Q 10 PO) 1 tablet, Daily   estrogen-methylTESTOSTERone (ESTRATEST) 1.25-2.5 MG per tablet 1 tablet, Daily at bedtime   fish oil-omega-3 fatty acids 1 g, Daily   fluticasone (FLONASE) 50 MCG/ACT nasal spray 2 sprays, Daily PRN   HYDROcodone-acetaminophen (NORCO/VICODIN) 5-325 MG per tablet 1 tablet, Oral, Every 6 hours PRN   metaxalone (SKELAXIN) 400-800 mg, 2 times daily PRN   nortriptyline (PAMELOR) 50 mg, Oral, Daily at bedtime   pantoprazole (PROTONIX) 40 mg, Oral, 2 times daily   rosuvastatin (CRESTOR) 20 mg, Oral    Objective:   PHYSICAL EXAMINATION:    VITALS:  There were no vitals filed for this visit.  GEN:  The patient appears stated age and is in NAD. HEENT:  Normocephalic, atraumatic.  The mucous membranes are moist. The superficial temporal arteries are without ropiness or tenderness. CV:  RRR Lungs:  CTAB Neck/HEME:  There are no carotid bruits bilaterally.  Neurological examination:  Orientation: The patient is alert and oriented x3.  Cranial nerves: There is good facial symmetry.  Extraocular muscles are intact. The visual fields are full to confrontational  testing. The speech is fluent and clear. Soft palate rises symmetrically and there is no tongue deviation. Hearing is intact to conversational tone. Sensation: Sensation is intact to light touch throughout (facial, trunk, extremities). Vibration is intact at the bilateral big toe. There is no extinction with double simultaneous stimulation.  Motor: Strength is 5/5 in the bilateral upper and lower extremities.   Shoulder shrug is equal and symmetric.  There is no pronator drift. Deep tendon reflexes: Deep tendon reflexes are 2/4 at the bilateral biceps, triceps, brachioradialis, patella and achilles. Plantar responses are downgoing bilaterally.  Movement examination: Tone: There is ***tone in the bilateral upper extremities.  The tone in the lower extremities is ***.  Abnormal movements: *** Coordination:  There is *** decremation with RAM's, *** Gait and Station: The patient has *** difficulty arising out of a deep-seated chair without the use of the hands. The patient's stride length is ***.  The patient has a *** pull test.     I have reviewed and interpreted the following labs independently   Chemistry      Component Value Date/Time   NA 136 02/26/2020 2115   K 3.4 (L) 02/26/2020 2115   CL 101 02/26/2020 2115   CO2 26 02/26/2020 2115   BUN 8 02/26/2020 2115   CREATININE 0.59 02/26/2020 2115   CREATININE 0.73 01/03/2016 0803      Component Value Date/Time   CALCIUM 9.0 02/26/2020 2115   ALKPHOS 39 01/03/2016 0803   AST 21 01/03/2016 0803   ALT 21 01/03/2016 0803   BILITOT 0.5 01/03/2016 0803      Lab Results  Component Value Date   TSH 4.44 01/03/2016   Lab Results  Component Value Date   WBC 10.7 (H) 02/26/2020   HGB 14.7 02/26/2020   HCT 43.3 02/26/2020   MCV 91.4 02/26/2020   PLT 292 02/26/2020   Patient had more recent labs with primary care on December 28, 2020.  White blood cells were 5.0, hemoglobin 12.3, hematocrit 35.9 and platelets 314.  Ferritin was  39.   Total time spent on today's visit was ***greater than 60 minutes, including both face-to-face time and nonface-to-face time.  Time included that spent on review of records (prior notes available to me/labs/imaging if pertinent), discussing treatment and goals, answering patient's questions and coordinating care.  Cc:  Briana Frees, MD

## 2021-04-01 ENCOUNTER — Ambulatory Visit: Payer: Medicare HMO | Admitting: Neurology

## 2021-04-02 DIAGNOSIS — R040 Epistaxis: Secondary | ICD-10-CM | POA: Diagnosis not present

## 2021-04-03 ENCOUNTER — Other Ambulatory Visit: Payer: Self-pay

## 2021-04-03 ENCOUNTER — Emergency Department (HOSPITAL_COMMUNITY)
Admission: EM | Admit: 2021-04-03 | Discharge: 2021-04-03 | Disposition: A | Discharge status: Home / Self Care | Payer: Medicare HMO | Attending: Emergency Medicine | Admitting: Emergency Medicine

## 2021-04-03 DIAGNOSIS — I2510 Atherosclerotic heart disease of native coronary artery without angina pectoris: Secondary | ICD-10-CM | POA: Insufficient documentation

## 2021-04-03 DIAGNOSIS — Z7982 Long term (current) use of aspirin: Secondary | ICD-10-CM | POA: Diagnosis not present

## 2021-04-03 DIAGNOSIS — R110 Nausea: Secondary | ICD-10-CM | POA: Insufficient documentation

## 2021-04-03 DIAGNOSIS — R040 Epistaxis: Secondary | ICD-10-CM | POA: Diagnosis not present

## 2021-04-03 DIAGNOSIS — Z87891 Personal history of nicotine dependence: Secondary | ICD-10-CM | POA: Insufficient documentation

## 2021-04-03 DIAGNOSIS — I10 Essential (primary) hypertension: Secondary | ICD-10-CM | POA: Diagnosis not present

## 2021-04-03 DIAGNOSIS — Z955 Presence of coronary angioplasty implant and graft: Secondary | ICD-10-CM | POA: Diagnosis not present

## 2021-04-03 LAB — CBC WITH DIFFERENTIAL/PLATELET
Abs Immature Granulocytes: 0.01 10*3/uL (ref 0.00–0.07)
Basophils Absolute: 0 10*3/uL (ref 0.0–0.1)
Basophils Relative: 1 %
Eosinophils Absolute: 0.2 10*3/uL (ref 0.0–0.5)
Eosinophils Relative: 2 %
HCT: 39.7 % (ref 36.0–46.0)
Hemoglobin: 13 g/dL (ref 12.0–15.0)
Immature Granulocytes: 0 %
Lymphocytes Relative: 31 %
Lymphs Abs: 2 10*3/uL (ref 0.7–4.0)
MCH: 30.5 pg (ref 26.0–34.0)
MCHC: 32.7 g/dL (ref 30.0–36.0)
MCV: 93.2 fL (ref 80.0–100.0)
Monocytes Absolute: 0.4 10*3/uL (ref 0.1–1.0)
Monocytes Relative: 7 %
Neutro Abs: 3.8 10*3/uL (ref 1.7–7.7)
Neutrophils Relative %: 59 %
Platelets: 262 10*3/uL (ref 150–400)
RBC: 4.26 MIL/uL (ref 3.87–5.11)
RDW: 13.7 % (ref 11.5–15.5)
WBC: 6.4 10*3/uL (ref 4.0–10.5)
nRBC: 0 % (ref 0.0–0.2)

## 2021-04-03 MED ORDER — OXYMETAZOLINE HCL 0.05 % NA SOLN
2.0000 | Freq: Once | NASAL | Status: AC
  Administered 2021-04-03: 20:00:00 2 via NASAL
  Filled 2021-04-03: qty 30

## 2021-04-03 NOTE — ED Provider Notes (Signed)
[facility] EMERGENCY DEPARTMENT Provider Note   CSN: [id number] Arrival date & time: 04/03/21  1427     History Chief Complaint  Patient presents with   Epistaxis    Briana Bowers is a 70 y.o. female.  This is a 70 y.o. female with significant medical history as below, including anxiety, arthritis, CAD who presents to the ED with complaint of epistaxis.  Patient reports recurrent nosebleeds over the past 3 days. Nosebleeds have resolved spontaneously.  She feels that the bleeding has been coming from her left naris.  She has not been vomiting.  She is tolerant oral intake without difficulty.  She has some chronic nausea which is not acutely worsened at this time.  No thinners.  Last time she experienced epistaxis approximately 1 to 2 hours ago while waiting for evaluation emergency department.  Bleeding resolved spontaneously.  She denies digital or facial trauma.  No headaches, vision changes, numbness, tingling, abdominal pain, chest pain, rashes or increased bruising.  No thinners.  No history of bleeding disorder.  She was seen in urgent care recommended she come to the ER for evaluation.     The history is provided by the patient and a relative. No language interpreter was used.  Epistaxis Associated symptoms: no cough, no fever and no headaches       Past Medical History:  Diagnosis Date   Anxiety disorder 12/27/2012   Arthritis    Degenerative joint disease   CAD (coronary artery disease), native coronary artery 12/27/2012   a. NSTEMI/RCA stent 1999. b. LAD stent 2014. c. Neg nuc 11/2014.   Chronic fatigue 12/27/2012   Chronic lower back pain    Gastroesophageal reflux disease 12/27/2012   H/O hiatal hernia    History of blood transfusion    w/tonsillectomy; w/spinal fusion   Hyperlipidemia    Hypotension    a. prompting holding of BB.   Myocardial infarction ([place]) 08/02/1997    Patient Active Problem List   Diagnosis Date Noted   Chest pain,  unspecified    CAD (coronary artery disease), native coronary artery 12/27/2012    Class: Chronic   Hyperlipidemia 12/27/2012    Class: Chronic   Degenerative joint disease 12/27/2012   Prediabetes 12/27/2012    Class: Chronic   Gastroesophageal reflux disease 12/27/2012    Class: Chronic   Anxiety disorder 12/27/2012    Class: Chronic   Chronic fatigue 12/27/2012    Class: Chronic    Past Surgical History:  Procedure Laterality Date   APPENDECTOMY  1965   BACK SURGERY     BILATERAL SALPINGOOPHORECTOMY Bilateral 1978   "2 separate ORs; months apart" (12/27/2012)   CARDIAC CATHETERIZATION  2000's   "couple times" (12/27/2012)   CHOLECYSTECTOMY  08/1989   CORONARY ANGIOPLASTY WITH STENT PLACEMENT  08/03/1997; 12/28/2012   1 + 1" (01/19/2013)   DILATION AND CURETTAGE OF UTERUS  09/1967   "after miscarriage" (12/27/2012)   FINGER ARTHROPLASTY Right 12/232011   "ring finger; fused it; put pin in it; 2nd joint" (12/27/2012)   FRACTIONAL FLOW RESERVE WIRE  12/28/2012   Procedure: [place];  Surgeon: Sinclair Grooms, MD;  Location: [facility] CATH LAB;  Service: Cardiovascular;;  PROX LAD   GANGLION CYST EXCISION Right 1990's   GUM SURGERY  2011   "tissue transplant; 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" (12/27/2012)   HERNIA REPAIR  02/2002   "ventral" (12/27/2012)   LEFT HEART CATHETERIZATION WITH CORONARY  ANGIOGRAM N/A 12/28/2012   Procedure: LEFT HEART CATHETERIZATION WITH CORONARY ANGIOGRAM;  Surgeon: Sinclair Grooms, MD;  Location: [facility] CATH LAB;  Service: Cardiovascular;  Laterality: N/A;   LEFT HEART CATHETERIZATION WITH CORONARY ANGIOGRAM N/A 01/20/2013   Procedure: LEFT HEART CATHETERIZATION WITH CORONARY ANGIOGRAM;  Surgeon: Blane Ohara, MD;  Location: [facility] CATH LAB;  Service: Cardiovascular;  Laterality: N/A;   LIGAMENT REPAIR Right 1990's   "radial collateral" (12/27/2012)   [place]; 1990's   "twice" (12/27/2012)   MASS EXCISION Left  1990's   "groin" (12/27/2012)   [place]   PARTIAL HYSTERECTOMY  1974   PERCUTANEOUS CORONARY STENT INTERVENTION (PCI-S)  12/28/2012   Procedure: PERCUTANEOUS CORONARY STENT INTERVENTION (PCI-S);  Surgeon: Sinclair Grooms, MD;  Location: [facility] CATH LAB;  Service: Cardiovascular;;  Prox LAD   POSTERIOR LUMBAR FUSION  04/2002   TONSILLECTOMY  1964   TYMPANOPLASTY Bilateral 1966; 2001   "left; right" (12/27/2012)     OB History   No obstetric history on file.     Family History  Problem Relation Age of Onset   Coronary artery disease Mother    Coronary artery disease Father    Other Daughter        TBI   Other Son        Suicide    Social History   Tobacco Use   Smoking status: Former    Packs/day: 1.00    Years: 17.00    Pack years: 17.00    Types: Cigarettes    Quit date: 12/27/1981    Years since quitting: 39.2   Smokeless tobacco: Never  Substance Use Topics   Alcohol use: No    Alcohol/week: 0.0 standard drinks   Drug use: No    Home Medications Prior to Admission medications   Medication Sig Start Date End Date Taking? Authorizing Provider  acidophilus (RISAQUAD) CAPS capsule Take 1 capsule by mouth daily.   Yes [provider]  albuterol (VENTOLIN HFA) 108 (90 Base) MCG/ACT inhaler Inhale 2 puffs into the lungs every 6 (six) hours as needed for wheezing or shortness of breath.   Yes [provider]  aspirin EC 81 MG tablet Take 81 mg by mouth daily. Swallow whole.   Yes [provider]  b complex vitamins capsule Take 1 capsule by mouth daily.   Yes [provider]  budesonide-formoterol (SYMBICORT) 160-4.5 MCG/ACT inhaler Inhale 2 puffs into the lungs 2 (two) times daily as needed (sob/wheezing).   Yes [provider]  cetirizine (ZYRTEC) 10 MG tablet Take 10 mg by mouth at bedtime.   Yes [provider]  Cholecalciferol (VITAMIN D3 PO) Take 1 tablet by mouth daily.   Yes [provider]  Coenzyme Q10 (CO Q 10 PO) Take 1 tablet by mouth daily.   Yes [provider]  estrogen-methylTESTOSTERone (ESTRATEST) 1.25-2.5 MG per tablet Take 1 tablet by mouth at bedtime.   Yes [provider]  fish oil-omega-3 fatty acids 1000 MG capsule Take 1 g by mouth daily.   Yes [provider]  fluticasone (FLONASE) 50 MCG/ACT nasal spray Place 2 sprays into both nostrils daily.   Yes [provider]  HYDROcodone-acetaminophen (NORCO) 10-325 MG tablet Take 1 tablet by mouth every 6 (six) hours as needed for moderate pain or severe pain. 04/01/21  Yes [provider]  meloxicam (MOBIC) 15 MG tablet Take 15 mg by mouth daily as needed for pain.  02/18/21  Yes [provider]  methocarbamol (ROBAXIN) 500 MG tablet Take 250 mg by mouth 6 (six) times daily. 03/01/21  Yes [provider]  nortriptyline (PAMELOR) 50 MG capsule Take 50 mg by mouth at bedtime.   Yes [provider]  pantoprazole (PROTONIX) 40 MG tablet Take 40 mg by mouth 2 (two) times daily as needed (indigestion).   Yes [provider]  rosuvastatin (CRESTOR) 10 MG tablet Take 10 mg by mouth at bedtime. 03/27/21  Yes [provider]  aspirin EC 325 MG tablet Take 1 tablet (325 mg total) by mouth daily. Patient not taking: Reported on 04/03/2021 01/03/14   Jettie Booze, MD  HYDROcodone-acetaminophen (NORCO/VICODIN) 5-325 MG per tablet Take 1 tablet by mouth every 6 (six) hours as needed for moderate pain. Patient not taking: Reported on 04/03/2021 03/31/14   Margarita Mail, PA-C    Allergies    Atorvastatin, Ciprofloxacin, Levaquin [levofloxacin in d5w], and Dilaudid [hydromorphone hcl]  Review of Systems   Review of Systems  Constitutional:  Negative for activity change and fever.  HENT:  Positive for nosebleeds. Negative for facial swelling and trouble swallowing.   Eyes:  Negative for discharge and redness.  Respiratory:  Negative for  cough and shortness of breath.   Cardiovascular:  Negative for chest pain and palpitations.  Gastrointestinal:  Positive for nausea. Negative for abdominal pain and vomiting.  Genitourinary:  Negative for dysuria and flank pain.  Musculoskeletal:  Negative for back pain and gait problem.  Skin:  Negative for pallor and rash.  Neurological:  Negative for syncope and headaches.   Physical Exam Updated Vital Signs BP 136/78 (BP Location: Right Arm)    Pulse 78    Temp 98.1 F (36.7 C) (Oral)    Resp 16    SpO2 98%   Physical Exam Vitals and nursing note reviewed.  Constitutional:      General: She is not in acute distress.    Appearance: Normal appearance.  HENT:     Head: Normocephalic and atraumatic.     Right Ear: External ear normal.     Left Ear: External ear normal.     Nose: Nose normal.     Comments: No visible bleeding appreciated to either naris.  No appreciable vascular abnormality to the naris.  There is minimal blood to the posterior oropharynx which appears brown.  No frank bleeding.  No blood on the tongue.    Mouth/Throat:     Mouth: Mucous membranes are moist.  Eyes:     General: No scleral icterus.       Right eye: No discharge.        Left eye: No discharge.  Cardiovascular:     Rate and Rhythm: Normal rate and regular rhythm.     Pulses: Normal pulses.     Heart sounds: Normal heart sounds.  Pulmonary:     Effort: Pulmonary effort is normal. No respiratory distress.     Breath sounds: Normal breath sounds.  Abdominal:     General: Abdomen is flat.     Tenderness: There is no abdominal tenderness.  Musculoskeletal:        General: Normal range of motion.     Cervical back: Normal range of motion.     Right lower leg: No edema.     Left lower leg: No edema.  Skin:    General: Skin is warm and dry.     Capillary Refill: Capillary refill takes less than 2  seconds.  Neurological:     Mental Status: She is alert.  Psychiatric:        Mood and Affect: Mood  normal.        Behavior: Behavior normal.    ED Results / Procedures / Treatments   Labs (all labs ordered are listed, but only abnormal results are displayed) Labs Reviewed  CBC WITH DIFFERENTIAL/PLATELET    EKG None  Radiology No results found.  Procedures Procedures   Medications Ordered in ED Medications  oxymetazoline (AFRIN) 0.05 % nasal spray 2 spray (2 sprays Left Nare Given 04/03/21 1953)    ED Course  I have reviewed the triage vital signs and the nursing notes.  Pertinent labs & imaging results that were available during my care of the patient were reviewed by me and considered in my medical decision making (see chart for details).    MDM Rules/Calculators/A&P                          CC: epistaxis  This patient complains of above; this involves an extensive number of treatment options and is a complaint that carries with it a high risk of complications and morbidity. Vital signs were reviewed. Serious etiologies considered.  Record review:  Previous records obtained and reviewed   Additional history obtained from daughter  Work up as above, notable for:  Lab results that were available during my care of the patient were reviewed by me and considered in my medical decision making.  CBC obtained in triage and is stable, hbg is 12, PLT 262  Management: Apply afrin   Reassessment:  No ongoing bleeding appreciated, no emesis. She is tolerating PO. No n/v   Discussed supportive care at home regarding preventing nosebleeds. Humidifier in bedroom, avoiding digital trauma, keeping naris moist. Follow-up w/ pcp.  The patient improved significantly and was discharged in stable condition. Detailed discussions were had with the patient regarding current findings, and need for close f/u with PCP or on call doctor. The patient has been instructed to return immediately if the symptoms worsen in any way for re-evaluation. Patient verbalized understanding and is in  agreement with current care plan. All questions answered prior to discharge.           This chart was dictated using voice recognition software.  Despite best efforts to proofread,  errors can occur which can change the documentation meaning.    Final Clinical Impression(s) / ED Diagnoses Final diagnoses:  Epistaxis    Rx / DC Orders ED Discharge Orders     None        Jeanell Sparrow, DO 04/03/21 2147

## 2021-04-03 NOTE — ED Provider Notes (Signed)
Emergency Medicine Provider Triage Evaluation Note  Briana Bowers , a 70 y.o. female  was evaluated in triage.  Pt complains of nosebleeds intermittently for 3 days.  Bleeding from the left nare.  Last blood this morning.  No lightheadedness or dizziness.  She thinks that her nose was dry, causing bleeding.  Patient has been putting a cotton ball in her nose.  Review of Systems  Positive: Nosebleed Negative: Lightheadedness  Physical Exam  There were no vitals taken for this visit. Gen:   Awake, no distress   Resp:  Normal effort  MSK:   Moves extremities without difficulty  Other:  Nose appears to be some dried blood on the anterior septum on the left, no active bleeding.  Medical Decision Making  Medically screening exam initiated at 2:42 PM.  Appropriate orders placed.  Briana Bowers was informed that the remainder of the evaluation will be completed by another provider, this initial triage assessment does not replace that evaluation, and the importance of remaining in the ED until their evaluation is complete.     Carlisle Cater, PA-C 04/03/21 1443    Godfrey Pick, MD 04/03/21 2214

## 2021-04-03 NOTE — ED Notes (Signed)
Patient alerts and oriented x4, ambulatory with steady gait. Patient taken all belongings with her and denies missing anything upon leaving the ED.

## 2021-04-03 NOTE — Discharge Instructions (Signed)
Please use humidifier in your bedroom at nighttime  Please avoid sticking objects in your nose  Return to urgency department for any worsening / worrisome symptoms

## 2021-04-03 NOTE — ED Triage Notes (Signed)
Pt c/o off increase epistasis x 3 days lasting several hours. Pt seen by PCP and told to come here.  PMH: spinal fusion

## 2021-04-17 DIAGNOSIS — M4716 Other spondylosis with myelopathy, lumbar region: Secondary | ICD-10-CM | POA: Diagnosis not present

## 2021-04-17 DIAGNOSIS — M4326 Fusion of spine, lumbar region: Secondary | ICD-10-CM | POA: Diagnosis not present

## 2021-04-17 DIAGNOSIS — M545 Low back pain, unspecified: Secondary | ICD-10-CM | POA: Diagnosis not present

## 2021-04-18 ENCOUNTER — Other Ambulatory Visit: Payer: Self-pay | Admitting: Orthopaedic Surgery

## 2021-04-18 DIAGNOSIS — M5416 Radiculopathy, lumbar region: Secondary | ICD-10-CM

## 2021-04-23 NOTE — Progress Notes (Deleted)
Assessment/Plan:   ***  Subjective:   Briana Bowers was seen today in the movement disorders clinic for neurologic consultation at the request of [name], MD.  The consultation is for the evaluation of tremor.  Medical records made available to me have been reviewed, including primary care records and visit Dr. [name].  Patient did see Dr. [name] back in 2016.  Patient is a 71 year old female with a history of CAD s/p PCI stent (12/2012), hyperlipidemia, chronic back pain with disc herniation s/p lumbar fusion L4-S1 (2004), prediabetes, GERD, and anxiety.  She saw Dr. [name] back in 2016 for low blood pressure (not felt to represent POTS) and memory change.  Records from Dr. [name] indicate that there was no evidence of bradykinesia.  She is referred back today to discuss intention tremor.  Tremor: {yes no:314532}   How long has it been going on? ***  At rest or with activation?  ***  When is it noted the most?  ***  Fam hx of tremor?  {yes NW:295621}  Located where?  ***  Affected by caffeine:  {yes no:314532}  Affected by alcohol:  {yes no:314532}  Affected by stress:  {yes no:314532}  Affected by fatigue:  {yes no:314532}  Spills soup if on spoon:  {yes no:314532}  Spills glass of liquid if full:  {yes no:314532}  Affects ADL's (tying shoes, brushing teeth, etc):  {yes no:314532}  Tremor inducing meds:  {yes no:314532}  Other Specific Symptoms:  Voice: *** Sleep: ***  Vivid Dreams:  {yes no:314532}  Acting out dreams:  {yes no:314532} Wet Pillows: {yes no:314532} Postural symptoms:  {yes no:314532}  Falls?  {yes no:314532} Bradykinesia symptoms: {parkinson brady:18041} Loss of smell:  {yes no:314532} Loss of taste:  {yes no:314532} Urinary Incontinence:  {yes no:314532} Difficulty Swallowing:  {yes no:314532} Handwriting, micrographia: {yes no:314532} Trouble with ADL's:  {yes no:314532}  Trouble buttoning clothing: {yes no:314532} Depression:  {yes no:314532} Memory  changes:  {yes no:314532} Hallucinations:  {yes no:314532}  visual distortions: {yes no:314532} N/V:  {yes no:314532} Lightheaded:  {yes no:314532}  Syncope: {yes no:314532} Diplopia:  {yes no:314532} Dyskinesia:  {yes no:314532}  Neuroimaging of the brain has *** previously been performed.  It *** available for my review today.  MRI brain was completed in 2016 demonstrating mild to moderate small vessel disease.  PREVIOUS MEDICATIONS: {Parkinson's RX:18200}  ALLERGIES:   Allergies  Allergen Reactions   Atorvastatin     Muscle soreness   Ciprofloxacin Other (See Comments)    "makes me feel very weak"   Levaquin [Levofloxacin In D5w] Other (See Comments)    "makes me feel very weak"   Dilaudid [Hydromorphone Hcl] Other (See Comments)    "makes me really sleepy"    CURRENT MEDICATIONS:  Current Outpatient Medications  Medication Instructions   acidophilus (RISAQUAD) CAPS capsule 1 capsule, Oral, Daily   albuterol (VENTOLIN HFA) 108 (90 Base) MCG/ACT inhaler 2 puffs, Every 6 hours PRN   aspirin EC 325 mg, Oral, Daily   aspirin EC 81 mg, Oral, Daily, Swallow whole.   b complex vitamins capsule 1 capsule, Daily   budesonide-formoterol (SYMBICORT) 160-4.5 MCG/ACT inhaler 2 puffs, Inhalation, 2 times daily PRN   cetirizine (ZYRTEC) 10 mg, Oral, Daily at bedtime   Cholecalciferol (VITAMIN D3 PO) 1 tablet, Daily   Coenzyme Q10 (CO Q 10 PO) 1 tablet, Daily   estrogen-methylTESTOSTERone (ESTRATEST) 1.25-2.5 MG per tablet 1 tablet, Daily at bedtime   fish oil-omega-3 fatty acids 1 g, Daily   fluticasone (  FLONASE) 50 MCG/ACT nasal spray 2 sprays, Each Nare, Daily   HYDROcodone-acetaminophen (NORCO) 10-325 MG tablet 1 tablet, Oral, Every 6 hours PRN   HYDROcodone-acetaminophen (NORCO/VICODIN) 5-325 MG per tablet 1 tablet, Oral, Every 6 hours PRN   meloxicam (MOBIC) 15 mg, Oral, Daily PRN   methocarbamol (ROBAXIN) 250 mg, Oral, 6 times daily   nortriptyline (PAMELOR) 50 mg, Oral, Daily  at bedtime   pantoprazole (PROTONIX) 40 mg, Oral, 2 times daily PRN   rosuvastatin (CRESTOR) 10 mg, Oral, Daily at bedtime    Objective:   PHYSICAL EXAMINATION:    VITALS:  There were no vitals filed for this visit.  GEN:  The patient appears stated age and is in NAD. HEENT:  Normocephalic, atraumatic.  The mucous membranes are moist. The superficial temporal arteries are without ropiness or tenderness. CV:  RRR Lungs:  CTAB Neck/HEME:  There are no carotid bruits bilaterally.  Neurological examination:  Orientation: The patient is alert and oriented x3.  Cranial nerves: There is good facial symmetry.  Extraocular muscles are intact. The visual fields are full to confrontational testing. The speech is fluent and clear. Soft palate rises symmetrically and there is no tongue deviation. Hearing is intact to conversational tone. Sensation: Sensation is intact to light touch throughout (facial, trunk, extremities). Vibration is intact at the bilateral big toe. There is no extinction with double simultaneous stimulation.  Motor: Strength is 5/5 in the bilateral upper and lower extremities.   Shoulder shrug is equal and symmetric.  There is no pronator drift. Deep tendon reflexes: Deep tendon reflexes are 2/4 at the bilateral biceps, triceps, brachioradialis, patella and achilles. Plantar responses are downgoing bilaterally.  Movement examination: Tone: There is ***tone in the bilateral upper extremities.  The tone in the lower extremities is ***.  Abnormal movements: *** Coordination:  There is *** decremation with RAM's, *** Gait and Station: The patient has *** difficulty arising out of a deep-seated chair without the use of the hands. The patient's stride length is ***.  The patient has a *** pull test.     I have reviewed and interpreted the following labs independently   Chemistry      Component Value Date/Time   NA 136 02/26/2020 2115   K 3.4 (L) 02/26/2020 2115   CL 101  02/26/2020 2115   CO2 26 02/26/2020 2115   BUN 8 02/26/2020 2115   CREATININE 0.59 02/26/2020 2115   CREATININE 0.73 01/03/2016 0803      Component Value Date/Time   CALCIUM 9.0 02/26/2020 2115   ALKPHOS 39 01/03/2016 0803   AST 21 01/03/2016 0803   ALT 21 01/03/2016 0803   BILITOT 0.5 01/03/2016 0803      Lab Results  Component Value Date   TSH 4.44 01/03/2016   Lab Results  Component Value Date   WBC 6.4 04/03/2021   HGB 13.0 04/03/2021   HCT 39.7 04/03/2021   MCV 93.2 04/03/2021   PLT 262 04/03/2021      Total time spent on today's visit was ***greater than 60 minutes, including both face-to-face time and nonface-to-face time.  Time included that spent on review of records (prior notes available to me/labs/imaging if pertinent), discussing treatment and goals, answering patient's questions and coordinating care.  Cc:  [name], MD

## 2021-04-24 ENCOUNTER — Other Ambulatory Visit: Payer: Self-pay

## 2021-04-24 ENCOUNTER — Ambulatory Visit
Admission: RE | Admit: 2021-04-24 | Discharge: 2021-04-24 | Disposition: A | Discharge status: Home / Self Care | Payer: Medicare HMO | Source: Ambulatory Visit | Attending: Orthopaedic Surgery | Admitting: Orthopaedic Surgery

## 2021-04-24 DIAGNOSIS — M5416 Radiculopathy, lumbar region: Secondary | ICD-10-CM

## 2021-04-24 DIAGNOSIS — M545 Low back pain, unspecified: Secondary | ICD-10-CM | POA: Diagnosis not present

## 2021-04-24 DIAGNOSIS — M48061 Spinal stenosis, lumbar region without neurogenic claudication: Secondary | ICD-10-CM | POA: Diagnosis not present

## 2021-04-26 ENCOUNTER — Ambulatory Visit: Payer: Medicare HMO | Admitting: Neurology

## 2021-04-28 ENCOUNTER — Other Ambulatory Visit: Payer: Medicare HMO

## 2021-05-15 DIAGNOSIS — Z13820 Encounter for screening for osteoporosis: Secondary | ICD-10-CM | POA: Diagnosis not present

## 2021-05-15 DIAGNOSIS — M4326 Fusion of spine, lumbar region: Secondary | ICD-10-CM | POA: Diagnosis not present

## 2021-05-15 DIAGNOSIS — N1330 Unspecified hydronephrosis: Secondary | ICD-10-CM | POA: Diagnosis not present

## 2021-05-15 DIAGNOSIS — M4716 Other spondylosis with myelopathy, lumbar region: Secondary | ICD-10-CM | POA: Diagnosis not present

## 2021-05-17 ENCOUNTER — Other Ambulatory Visit: Payer: Self-pay | Admitting: Orthopaedic Surgery

## 2021-05-17 DIAGNOSIS — Z13820 Encounter for screening for osteoporosis: Secondary | ICD-10-CM

## 2021-05-21 ENCOUNTER — Encounter: Payer: Self-pay | Admitting: Pharmacist

## 2021-05-21 NOTE — Progress Notes (Signed)
This encounter was created in error - please disregard.

## 2021-05-23 DIAGNOSIS — R040 Epistaxis: Secondary | ICD-10-CM | POA: Diagnosis not present

## 2021-05-23 DIAGNOSIS — J340 Abscess, furuncle and carbuncle of nose: Secondary | ICD-10-CM | POA: Diagnosis not present

## 2021-05-27 DIAGNOSIS — M5116 Intervertebral disc disorders with radiculopathy, lumbar region: Secondary | ICD-10-CM | POA: Diagnosis not present

## 2021-05-28 DIAGNOSIS — Z13820 Encounter for screening for osteoporosis: Secondary | ICD-10-CM | POA: Diagnosis not present

## 2021-05-31 ENCOUNTER — Other Ambulatory Visit: Payer: Self-pay

## 2021-05-31 ENCOUNTER — Ambulatory Visit: Payer: Medicare HMO | Admitting: Podiatry

## 2021-05-31 DIAGNOSIS — S90212A Contusion of left great toe with damage to nail, initial encounter: Secondary | ICD-10-CM

## 2021-05-31 NOTE — Progress Notes (Signed)
Subjective:  Patient ID: Briana Bowers, female    DOB: 13-Mar-1951,  MRN: [id number]  Chief Complaint  Patient presents with   Nail Problem    Left hallux nail causing pain and discolored     71 y.o. female presents with the above complaint.  Patient presents with: Concern for left hallux discoloration of the nail.  Patient states causing her pain she is notices soreness about 2 weeks has progressed gotten worse.  She does not recall any trauma to the area.  She states it just came out of nowhere and has been hurting her since.  She wanted to get it evaluated.  She denies trying anything for it.  She states she had it feels better not open toed [employer] shoes.  Pain scale is 7 out of 10 hurts with ambulation.  Dull achy in nature   Review of Systems: Negative except as noted in the HPI. Denies N/V/F/Ch.  Past Medical History:  Diagnosis Date   Anxiety disorder 12/27/2012   Arthritis    Degenerative joint disease   CAD (coronary artery disease), native coronary artery 12/27/2012   a. NSTEMI/RCA stent 1999. b. LAD stent 2014. c. Neg nuc 11/2014.   Chronic fatigue 12/27/2012   Chronic lower back pain    Gastroesophageal reflux disease 12/27/2012   H/O hiatal hernia    History of blood transfusion    w/tonsillectomy; w/spinal fusion   Hyperlipidemia    Hypotension    a. prompting holding of BB.   Myocardial infarction ([place]) 08/02/1997    Current Outpatient Medications:    acidophilus (RISAQUAD) CAPS capsule, Take 1 capsule by mouth daily., Disp: , Rfl:    albuterol (VENTOLIN HFA) 108 (90 Base) MCG/ACT inhaler, Inhale 2 puffs into the lungs every 6 (six) hours as needed for wheezing or shortness of breath., Disp: , Rfl:    aspirin EC 325 MG tablet, Take 1 tablet (325 mg total) by mouth daily. (Patient not taking: Reported on 04/03/2021), Disp: 30 tablet, Rfl: 0   aspirin EC 81 MG tablet, Take 81 mg by mouth daily. Swallow whole., Disp: , Rfl:    b complex vitamins capsule, Take 1  capsule by mouth daily., Disp: , Rfl:    budesonide-formoterol (SYMBICORT) 160-4.5 MCG/ACT inhaler, Inhale 2 puffs into the lungs 2 (two) times daily as needed (sob/wheezing)., Disp: , Rfl:    cetirizine (ZYRTEC) 10 MG tablet, Take 10 mg by mouth at bedtime., Disp: , Rfl:    Cholecalciferol (VITAMIN D3 PO), Take 1 tablet by mouth daily., Disp: , Rfl:    Coenzyme Q10 (CO Q 10 PO), Take 1 tablet by mouth daily., Disp: , Rfl:    estrogen-methylTESTOSTERone (ESTRATEST) 1.25-2.5 MG per tablet, Take 1 tablet by mouth at bedtime., Disp: , Rfl:    fish oil-omega-3 fatty acids 1000 MG capsule, Take 1 g by mouth daily., Disp: , Rfl:    fluticasone (FLONASE) 50 MCG/ACT nasal spray, Place 2 sprays into both nostrils daily., Disp: , Rfl:    HYDROcodone-acetaminophen (NORCO) 10-325 MG tablet, Take 1 tablet by mouth every 6 (six) hours as needed for moderate pain or severe pain., Disp: , Rfl:    HYDROcodone-acetaminophen (NORCO/VICODIN) 5-325 MG per tablet, Take 1 tablet by mouth every 6 (six) hours as needed for moderate pain. (Patient not taking: Reported on 04/03/2021), Disp: 15 tablet, Rfl: 0   meloxicam (MOBIC) 15 MG tablet, Take 15 mg by mouth daily as needed for pain., Disp: , Rfl:    methocarbamol (ROBAXIN) 500  MG tablet, Take 250 mg by mouth 6 (six) times daily., Disp: , Rfl:    nortriptyline (PAMELOR) 50 MG capsule, Take 50 mg by mouth at bedtime., Disp: , Rfl:    pantoprazole (PROTONIX) 40 MG tablet, Take 40 mg by mouth 2 (two) times daily as needed (indigestion)., Disp: , Rfl:    rosuvastatin (CRESTOR) 10 MG tablet, Take 10 mg by mouth at bedtime., Disp: , Rfl:   Social History   Tobacco Use  Smoking Status Former   Packs/day: 1.00   Years: 17.00   Pack years: 17.00   Types: Cigarettes   Quit date: 12/27/1981   Years since quitting: 39.4  Smokeless Tobacco Never    Allergies  Allergen Reactions   Atorvastatin     Muscle soreness   Ciprofloxacin Other (See Comments)    "makes me feel  very weak"   Levaquin [Levofloxacin In D5w] Other (See Comments)    "makes me feel very weak"   Dilaudid [Hydromorphone Hcl] Other (See Comments)    "makes me really sleepy"   Objective:  There were no vitals filed for this visit. There is no height or weight on file to calculate BMI. Constitutional Well developed. Well nourished.  Vascular Dorsalis pedis pulses palpable bilaterally. Posterior tibial pulses palpable bilaterally. Capillary refill normal to all digits.  No cyanosis or clubbing noted. Pedal hair growth normal.  Neurologic Normal speech. Oriented to person, place, and time. Epicritic sensation to light touch grossly present bilaterally.  Dermatologic Discoloration and dystrophic left hallux nail noted.  Mild pain on palpation.  Signs of contusion noted.  Nail is well adhered to the underlying nailbed.  No signs of loosening noted.  Orthopedic: Normal joint ROM without pain or crepitus bilaterally. No visible deformities. No bony tenderness.   Radiographs: None Assessment:   1. Contusion of left great toe with damage to nail, initial encounter    Plan:  Patient was evaluated and treated and all questions answered.  Left hallux nail contusion with mild damage to the nail -All questions and concerns were discussed with the patient in extensive detail -At this time the nail is very well adhered to the underlying nailbed if I discussed with her if it comes back and is causing it to be looser to come back and see me right away.  She states understanding will do so immediately. -It is not clinically infected.  Less than 15% hematoma noted.  No follow-ups on file.

## 2021-06-13 DIAGNOSIS — R040 Epistaxis: Secondary | ICD-10-CM | POA: Diagnosis not present

## 2021-06-13 DIAGNOSIS — R0982 Postnasal drip: Secondary | ICD-10-CM | POA: Diagnosis not present

## 2021-06-13 DIAGNOSIS — J019 Acute sinusitis, unspecified: Secondary | ICD-10-CM | POA: Diagnosis not present

## 2021-06-14 DIAGNOSIS — N131 Hydronephrosis with ureteral stricture, not elsewhere classified: Secondary | ICD-10-CM | POA: Diagnosis not present

## 2021-06-20 ENCOUNTER — Other Ambulatory Visit (HOSPITAL_COMMUNITY): Payer: Self-pay | Admitting: Urology

## 2021-06-20 DIAGNOSIS — N131 Hydronephrosis with ureteral stricture, not elsewhere classified: Secondary | ICD-10-CM

## 2021-06-21 DIAGNOSIS — E782 Mixed hyperlipidemia: Secondary | ICD-10-CM | POA: Diagnosis not present

## 2021-06-21 DIAGNOSIS — R7303 Prediabetes: Secondary | ICD-10-CM | POA: Diagnosis not present

## 2021-06-21 DIAGNOSIS — G894 Chronic pain syndrome: Secondary | ICD-10-CM | POA: Diagnosis not present

## 2021-06-21 DIAGNOSIS — R251 Tremor, unspecified: Secondary | ICD-10-CM | POA: Diagnosis not present

## 2021-06-25 DIAGNOSIS — N131 Hydronephrosis with ureteral stricture, not elsewhere classified: Secondary | ICD-10-CM | POA: Diagnosis not present

## 2021-06-25 DIAGNOSIS — N1330 Unspecified hydronephrosis: Secondary | ICD-10-CM | POA: Diagnosis not present

## 2021-06-25 DIAGNOSIS — K5900 Constipation, unspecified: Secondary | ICD-10-CM | POA: Diagnosis not present

## 2021-06-28 ENCOUNTER — Other Ambulatory Visit: Payer: Self-pay

## 2021-06-28 ENCOUNTER — Ambulatory Visit (HOSPITAL_COMMUNITY)
Admission: RE | Admit: 2021-06-28 | Discharge: 2021-06-28 | Disposition: A | Discharge status: Home / Self Care | Payer: Medicare HMO | Source: Ambulatory Visit | Attending: Urology | Admitting: Urology

## 2021-06-28 DIAGNOSIS — N131 Hydronephrosis with ureteral stricture, not elsewhere classified: Secondary | ICD-10-CM | POA: Insufficient documentation

## 2021-06-28 MED ORDER — FUROSEMIDE 10 MG/ML IJ SOLN
30.0000 mg | Freq: Once | INTRAMUSCULAR | Status: AC
  Administered 2021-06-28: 30 mg via INTRAVENOUS

## 2021-06-28 MED ORDER — FUROSEMIDE 10 MG/ML IJ SOLN
INTRAMUSCULAR | Status: AC
  Filled 2021-06-28: qty 4

## 2021-07-03 DIAGNOSIS — I2510 Atherosclerotic heart disease of native coronary artery without angina pectoris: Secondary | ICD-10-CM | POA: Diagnosis not present

## 2021-07-03 DIAGNOSIS — M5116 Intervertebral disc disorders with radiculopathy, lumbar region: Secondary | ICD-10-CM | POA: Diagnosis not present

## 2021-07-03 DIAGNOSIS — R000 Tachycardia, unspecified: Secondary | ICD-10-CM | POA: Diagnosis not present

## 2021-07-03 DIAGNOSIS — I252 Old myocardial infarction: Secondary | ICD-10-CM | POA: Diagnosis not present

## 2021-07-03 DIAGNOSIS — M4326 Fusion of spine, lumbar region: Secondary | ICD-10-CM | POA: Diagnosis not present

## 2021-07-03 DIAGNOSIS — Z6824 Body mass index (BMI) 24.0-24.9, adult: Secondary | ICD-10-CM | POA: Diagnosis not present

## 2021-07-03 DIAGNOSIS — Z955 Presence of coronary angioplasty implant and graft: Secondary | ICD-10-CM | POA: Diagnosis not present

## 2021-07-04 DIAGNOSIS — M545 Low back pain, unspecified: Secondary | ICD-10-CM | POA: Diagnosis not present

## 2021-07-04 DIAGNOSIS — N131 Hydronephrosis with ureteral stricture, not elsewhere classified: Secondary | ICD-10-CM | POA: Diagnosis not present

## 2021-07-09 DIAGNOSIS — M4714 Other spondylosis with myelopathy, thoracic region: Secondary | ICD-10-CM | POA: Diagnosis not present

## 2021-07-30 DIAGNOSIS — J329 Chronic sinusitis, unspecified: Secondary | ICD-10-CM | POA: Diagnosis not present

## 2021-07-31 DIAGNOSIS — M4326 Fusion of spine, lumbar region: Secondary | ICD-10-CM | POA: Diagnosis not present

## 2021-07-31 DIAGNOSIS — Z6824 Body mass index (BMI) 24.0-24.9, adult: Secondary | ICD-10-CM | POA: Diagnosis not present

## 2021-07-31 DIAGNOSIS — M5116 Intervertebral disc disorders with radiculopathy, lumbar region: Secondary | ICD-10-CM | POA: Diagnosis not present

## 2021-08-14 DIAGNOSIS — J301 Allergic rhinitis due to pollen: Secondary | ICD-10-CM | POA: Diagnosis not present

## 2021-08-14 DIAGNOSIS — J329 Chronic sinusitis, unspecified: Secondary | ICD-10-CM | POA: Diagnosis not present

## 2021-08-14 DIAGNOSIS — H6123 Impacted cerumen, bilateral: Secondary | ICD-10-CM | POA: Diagnosis not present

## 2021-08-20 DIAGNOSIS — I2510 Atherosclerotic heart disease of native coronary artery without angina pectoris: Secondary | ICD-10-CM | POA: Diagnosis not present

## 2021-08-20 DIAGNOSIS — Z01810 Encounter for preprocedural cardiovascular examination: Secondary | ICD-10-CM | POA: Diagnosis not present

## 2021-08-20 DIAGNOSIS — Z955 Presence of coronary angioplasty implant and graft: Secondary | ICD-10-CM | POA: Diagnosis not present

## 2021-08-20 DIAGNOSIS — I10 Essential (primary) hypertension: Secondary | ICD-10-CM | POA: Insufficient documentation

## 2021-08-20 DIAGNOSIS — G909 Disorder of the autonomic nervous system, unspecified: Secondary | ICD-10-CM | POA: Diagnosis not present

## 2021-08-20 DIAGNOSIS — I455 Other specified heart block: Secondary | ICD-10-CM | POA: Diagnosis not present

## 2021-08-20 DIAGNOSIS — E782 Mixed hyperlipidemia: Secondary | ICD-10-CM | POA: Diagnosis not present

## 2021-08-20 DIAGNOSIS — I252 Old myocardial infarction: Secondary | ICD-10-CM | POA: Diagnosis not present

## 2021-08-20 HISTORY — DX: Essential (primary) hypertension: I10

## 2021-08-22 ENCOUNTER — Encounter: Payer: Self-pay | Admitting: Neurology

## 2021-08-22 NOTE — Progress Notes (Signed)
? ?Assessment/Plan:  ? ?1.  Tremor, by history ? -didn't see a lot of tremor on examination today ? -reassurred no evidence of a neurodegenerative disorder such as Parkinson's ? -Discussed with patient that I do think that the hydrocodone could be contributing, mostly the wearing off of the hydrocodone.  She goes through about 150 hydrocodone every month to month and a half (PDMP is reviewed).  She disagrees and states that tremor started before the addition of hydrocodone.  I again reiterated that it could just be a contributing factor, although I just did not see a lot of it on examination today. ? -Discussed with her that I think that the risks of medications would greatly outweigh benefits, and she does not disagree.  She wanted no further medications. ? ?2.  Cog change ? -discussed potential contribution from her meds (hydrocodone, nortripytline, robaxin) but patient insists that these are not the issue ? -neurocog testing ordered in 2016 and was not completed ? -pt wants to do neurocog testing now.  Explained that we are booked out extensively for this but there have been c/o this for years (saw Dr. [name] in 2016).  She would like to proceed with the testing.  We will get her scheduled.  She can then follow-up with my PA afterwards. ? -Depending on the results of the cognitive testing, we will decide whether or not to repeat MRI brain. ? ?Subjective:  ? ?Briana Bowers was seen today in the movement disorders clinic for neurologic consultation at the request of [name], MD.  The consultation is for the evaluation of tremor.  Medical records made available to me have been reviewed, including primary care records and visit Dr. [name].  Patient did see Dr. [name] back in 2016.  Patient is a 71 year old female with a history of CAD s/p PCI stent (12/2012), hyperlipidemia, chronic back pain with disc herniation s/p lumbar fusion L4-S1 (2004), prediabetes, GERD, and anxiety.  She saw Dr. [name] back in 2016 for  low blood pressure (not felt to represent POTS) and memory change.  Records from Dr. [name] indicate that there was no evidence of bradykinesia.  She is referred back today to discuss intention tremor.  "Im not hear really for that but for the cognitive" ? ?Tremor: Yes.    ? How long has it been going on? She has trouble telling me how long, but at least a couple of years ? At rest or with activation?  activation ? When is it noted the most?  Putting laundry away ? Fam hx of tremor?  Yes.  , mother has little bit of tremor ? Located where?  Bilateral UE ? Affected by caffeine:  doesn't drink caffeine ? Affected by alcohol:  doesn't drink alcohol ? Affected by stress:  No. ? Affected by fatigue:  Yes.   ? Spills glass of liquid if full:  No. ? Affects ADL's (tying shoes, brushing teeth, etc):  No., but has trouble due to "Pain in ligaments in my arms" ? Tremor inducing meds:  Yes.  , symbicort (rarely takes), albuterol (rarely takes) ? ?Other Specific Symptoms:  ?Voice: little softer ?Postural symptoms:  No. ? Falls?  No. - none since 01/2020 ?Bradykinesia symptoms: difficulty getting out of a chair due to pain but able to do without hands ?Loss of smell:  Yes.  , attributes to a virus she had at end of 2019 and bad since then ?Loss of taste:  Yes.   ?Urinary Incontinence: has some urgency  and wears pad b/c of it ?Difficulty Swallowing:  No., only with very large capsules ?Handwriting, micrographia: No. ?Memory changes:  Yes.  , does own finances and "I have no problems with this except the words don't come out like I want."  She lives with husband whom she caregives for.  Pt does cooking.  "I do the driving and I have no issues with the driving."  She prepares her own pillbox and her husbands.  States that her memory is not due to "the drugs, as it has been going on for years."  States that "if it were the drugs it would be more intermittent."  States that years ago, after an MI, she put ice cream in cabinet.   ?N/V:   Yes.  , chronic nausea due to pain and meds - takes meclizine daily for the nausea ?Lightheaded:  rare ? Syncope: No. ?Diplopia:  No. ? ?Neuroimaging of the brain has  previously been performed.  It was done in 2016, also for memory change.  There is mild to moderate small vessel disease.  The largest T2 hyperintensities in the left frontotemporal region.  I personally reviewed it. ? ? ? ?ALLERGIES:   ?Allergies  ?Allergen Reactions  ? Atorvastatin   ?  Muscle soreness  ? Ciprofloxacin Other (See Comments)  ?  "makes me feel very weak"  ? Levaquin [Levofloxacin In D5w] Other (See Comments)  ?  "makes me feel very weak"  ? Dilaudid [Hydromorphone Hcl] Other (See Comments)  ?  "makes me really sleepy"  ? ? ?CURRENT MEDICATIONS:  ?Current Outpatient Medications  ?Medication Instructions  ? acidophilus (RISAQUAD) CAPS capsule 1 capsule, Oral, Daily  ? albuterol (VENTOLIN HFA) 108 (90 Base) MCG/ACT inhaler 2 puffs, Every 6 hours PRN  ? aspirin EC 81 mg, Oral, Daily, Swallow whole.  ? b complex vitamins capsule 1 capsule, Daily  ? budesonide-formoterol (SYMBICORT) 160-4.5 MCG/ACT inhaler 2 puffs, Inhalation, 2 times daily PRN  ? cetirizine (ZYRTEC) 10 mg, Oral, Daily at bedtime  ? Cholecalciferol (VITAMIN D3 PO) 1 tablet, Daily  ? Coenzyme Q10 (CO Q 10 PO) 1 tablet, Daily  ? estrogen-methylTESTOSTERone (ESTRATEST) 1.25-2.5 MG per tablet 1 tablet, Daily at bedtime  ? fish oil-omega-3 fatty acids 1 g, Daily  ? fluticasone (FLONASE) 50 MCG/ACT nasal spray 2 sprays, Daily  ? HYDROcodone-acetaminophen (NORCO) 10-325 MG tablet 1 tablet, Oral, Every 6 hours PRN  ? HYDROcodone-acetaminophen (NORCO/VICODIN) 5-325 MG per tablet 1 tablet, Oral, Every 6 hours PRN  ? meloxicam (MOBIC) 15 mg, Oral, Daily PRN  ? methocarbamol (ROBAXIN) 250 mg, Oral, 6 times daily  ? nortriptyline (PAMELOR) 50 mg, Oral, Daily at bedtime  ? pantoprazole (PROTONIX) 40 mg, Oral, 2 times daily PRN  ? rosuvastatin (CRESTOR) 10 mg, Oral, Daily at bedtime   ? ? ?Objective:  ? ?PHYSICAL EXAMINATION:   ? ?VITALS:   ?Vitals:  ? 08/26/21 1008  ?BP: 122/72  ?Pulse: 62  ?SpO2: 97%  ?Weight: 133 lb 6.4 oz (60.5 kg)  ?Height: '5\' 2"'$  (1.575 m)  ? ? ?GEN:  The patient appears stated age and is in NAD. ?HEENT:  Normocephalic, atraumatic.  The mucous membranes are moist. The superficial temporal arteries are without ropiness or tenderness. ?CV:  RRR ?Lungs:  CTAB ?Neck/HEME:  There are no carotid bruits bilaterally. ? ?Neurological examination: ? ?Orientation: The patient is alert and oriented x3.  ?Cranial nerves: There is good facial symmetry.  Extraocular muscles are intact. The visual fields are full to confrontational testing.  The speech is fluent and clear. Soft palate rises symmetrically and there is no tongue deviation. Hearing is intact to conversational tone. ?Sensation: Sensation is intact to light touch throughout (facial, trunk, extremities). Vibration is intact at the bilateral big toe. There is no extinction with double simultaneous stimulation.  ?Motor: Strength is 5/5 in the bilateral upper and lower extremities.   Shoulder shrug is equal and symmetric.  There is no pronator drift. ?Deep tendon reflexes: Deep tendon reflexes are 3/4 at the bilateral biceps, triceps, brachioradialis, 2+ at the bilateral patella and achilles. Plantar responses are downgoing bilaterally. ? ?Movement examination: ?Tone: There is normal tone in the bilateral upper extremities.  The tone in the lower extremities is normal.  ?Abnormal movements: There is no rest tremor.  There is no postural tremor.  She has no evidence of tremor with Archimedes spirals bilaterally. ?Coordination:  There is no decremation with RAM's, with any form of RAMS, including alternating supination and pronation of the forearm, hand opening and closing, finger taps, heel taps and toe taps. ?Gait and Station: The patient ambulates stiffly.  She is standing tall with ambulation.  She is tenuous.  She is careful  (reports because of back pain).  She is wide based.   ?I have reviewed and interpreted the following labs independently ?  Chemistry   ?   ?Component Value Date/Time  ? NA 136 02/26/2020 2115  ? K 3.4 (L) 11

## 2021-08-23 DIAGNOSIS — B085 Enteroviral vesicular pharyngitis: Secondary | ICD-10-CM | POA: Diagnosis not present

## 2021-08-26 ENCOUNTER — Encounter: Payer: Self-pay | Admitting: Neurology

## 2021-08-26 ENCOUNTER — Ambulatory Visit: Payer: Medicare HMO | Admitting: Neurology

## 2021-08-26 VITALS — BP 122/72 | HR 62 | Ht 62.0 in | Wt 133.4 lb

## 2021-08-26 DIAGNOSIS — I455 Other specified heart block: Secondary | ICD-10-CM | POA: Diagnosis not present

## 2021-08-26 DIAGNOSIS — Z955 Presence of coronary angioplasty implant and graft: Secondary | ICD-10-CM | POA: Diagnosis not present

## 2021-08-26 DIAGNOSIS — I2510 Atherosclerotic heart disease of native coronary artery without angina pectoris: Secondary | ICD-10-CM | POA: Diagnosis not present

## 2021-08-26 DIAGNOSIS — I252 Old myocardial infarction: Secondary | ICD-10-CM | POA: Diagnosis not present

## 2021-08-26 DIAGNOSIS — R413 Other amnesia: Secondary | ICD-10-CM | POA: Diagnosis not present

## 2021-08-26 DIAGNOSIS — E782 Mixed hyperlipidemia: Secondary | ICD-10-CM | POA: Diagnosis not present

## 2021-08-26 DIAGNOSIS — I10 Essential (primary) hypertension: Secondary | ICD-10-CM | POA: Diagnosis not present

## 2021-08-26 DIAGNOSIS — R251 Tremor, unspecified: Secondary | ICD-10-CM

## 2021-08-26 NOTE — Patient Instructions (Signed)
You have been referred for a neurocognitive evaluation (i.e., evaluation of memory and thinking abilities). Please bring someone with you to this appointment if possible, as it is helpful for the neuropsychologist to hear from both you and another adult who knows you well. Please bring eyeglasses and hearing aids if you wear them and take any medications as you normally would.    The evaluation will take approximately 2-3 hours and has two parts:   The first part is a clinical interview with the neuropsychologist, Dr. Merz.  During the interview, the neuropsychologist will speak with you and the individual you brought to the appointment.    The second part of the evaluation is testing with the doctor's technician, aka psychometrician, Dana or Kim. During the testing, the technician will ask you to remember different types of material, solve problems, and answer some questionnaires. Your family member will not be present for this portion of the evaluation.   Please note: We have to reserve several hours of the neuropsychologist's time and the psychometrician's time for your evaluation appointment. As such, there is a No-Show fee of $100. If you are unable to attend any of your appointments, please contact our office as soon as possible to reschedule.  

## 2021-08-28 DIAGNOSIS — Z01810 Encounter for preprocedural cardiovascular examination: Secondary | ICD-10-CM | POA: Diagnosis not present

## 2021-08-28 DIAGNOSIS — I2510 Atherosclerotic heart disease of native coronary artery without angina pectoris: Secondary | ICD-10-CM | POA: Diagnosis not present

## 2021-09-04 ENCOUNTER — Ambulatory Visit: Payer: Medicare HMO | Admitting: Neurology

## 2021-09-10 DIAGNOSIS — Z955 Presence of coronary angioplasty implant and graft: Secondary | ICD-10-CM | POA: Diagnosis not present

## 2021-09-10 DIAGNOSIS — I2510 Atherosclerotic heart disease of native coronary artery without angina pectoris: Secondary | ICD-10-CM | POA: Diagnosis not present

## 2021-09-10 DIAGNOSIS — I10 Essential (primary) hypertension: Secondary | ICD-10-CM | POA: Diagnosis not present

## 2021-09-10 DIAGNOSIS — I252 Old myocardial infarction: Secondary | ICD-10-CM | POA: Diagnosis not present

## 2021-09-18 DIAGNOSIS — M48062 Spinal stenosis, lumbar region with neurogenic claudication: Secondary | ICD-10-CM | POA: Insufficient documentation

## 2021-09-18 HISTORY — DX: Spinal stenosis, lumbar region with neurogenic claudication: M48.062

## 2021-09-24 ENCOUNTER — Other Ambulatory Visit: Payer: Self-pay | Admitting: Urology

## 2021-10-01 DIAGNOSIS — R413 Other amnesia: Secondary | ICD-10-CM | POA: Diagnosis not present

## 2021-10-02 DIAGNOSIS — L820 Inflamed seborrheic keratosis: Secondary | ICD-10-CM | POA: Diagnosis not present

## 2021-10-02 DIAGNOSIS — L570 Actinic keratosis: Secondary | ICD-10-CM | POA: Diagnosis not present

## 2021-10-02 DIAGNOSIS — D0472 Carcinoma in situ of skin of left lower limb, including hip: Secondary | ICD-10-CM | POA: Diagnosis not present

## 2021-10-08 NOTE — Progress Notes (Signed)
Spoke with patient via telephone, stated she thought surgery had been rescheduled for 11/26/21. Contacted Coni at [facility] to confirm, awaiting return call.

## 2021-10-16 DIAGNOSIS — R350 Frequency of micturition: Secondary | ICD-10-CM | POA: Diagnosis not present

## 2021-10-30 DIAGNOSIS — I2510 Atherosclerotic heart disease of native coronary artery without angina pectoris: Secondary | ICD-10-CM | POA: Diagnosis not present

## 2021-10-30 DIAGNOSIS — G894 Chronic pain syndrome: Secondary | ICD-10-CM | POA: Diagnosis not present

## 2021-10-30 DIAGNOSIS — R7303 Prediabetes: Secondary | ICD-10-CM | POA: Diagnosis not present

## 2021-10-30 DIAGNOSIS — E2839 Other primary ovarian failure: Secondary | ICD-10-CM | POA: Diagnosis not present

## 2021-10-30 DIAGNOSIS — E782 Mixed hyperlipidemia: Secondary | ICD-10-CM | POA: Diagnosis not present

## 2021-11-14 DIAGNOSIS — H6063 Unspecified chronic otitis externa, bilateral: Secondary | ICD-10-CM | POA: Diagnosis not present

## 2021-11-14 DIAGNOSIS — J320 Chronic maxillary sinusitis: Secondary | ICD-10-CM | POA: Diagnosis not present

## 2021-11-14 DIAGNOSIS — J301 Allergic rhinitis due to pollen: Secondary | ICD-10-CM | POA: Diagnosis not present

## 2021-11-15 ENCOUNTER — Ambulatory Visit (HOSPITAL_BASED_OUTPATIENT_CLINIC_OR_DEPARTMENT_OTHER): Admission: RE | Admit: 2021-11-15 | Payer: Medicare HMO | Source: Ambulatory Visit | Admitting: Urology

## 2021-11-15 SURGERY — CYSTOSCOPY, WITH RETROGRADE PYELOGRAM AND URETERAL STENT INSERTION
Anesthesia: General | Laterality: Left

## 2021-11-19 IMAGING — CR DG CHEST 2V
2 series · 2 of 2 positions shown · non-contrast
Comparison: 05/20/2017

CLINICAL DATA: History of COVID 3 weeks ago with persistent
shortness of breath, initial encounter

EXAM:
CHEST - 2 VIEW

[w chest pa]
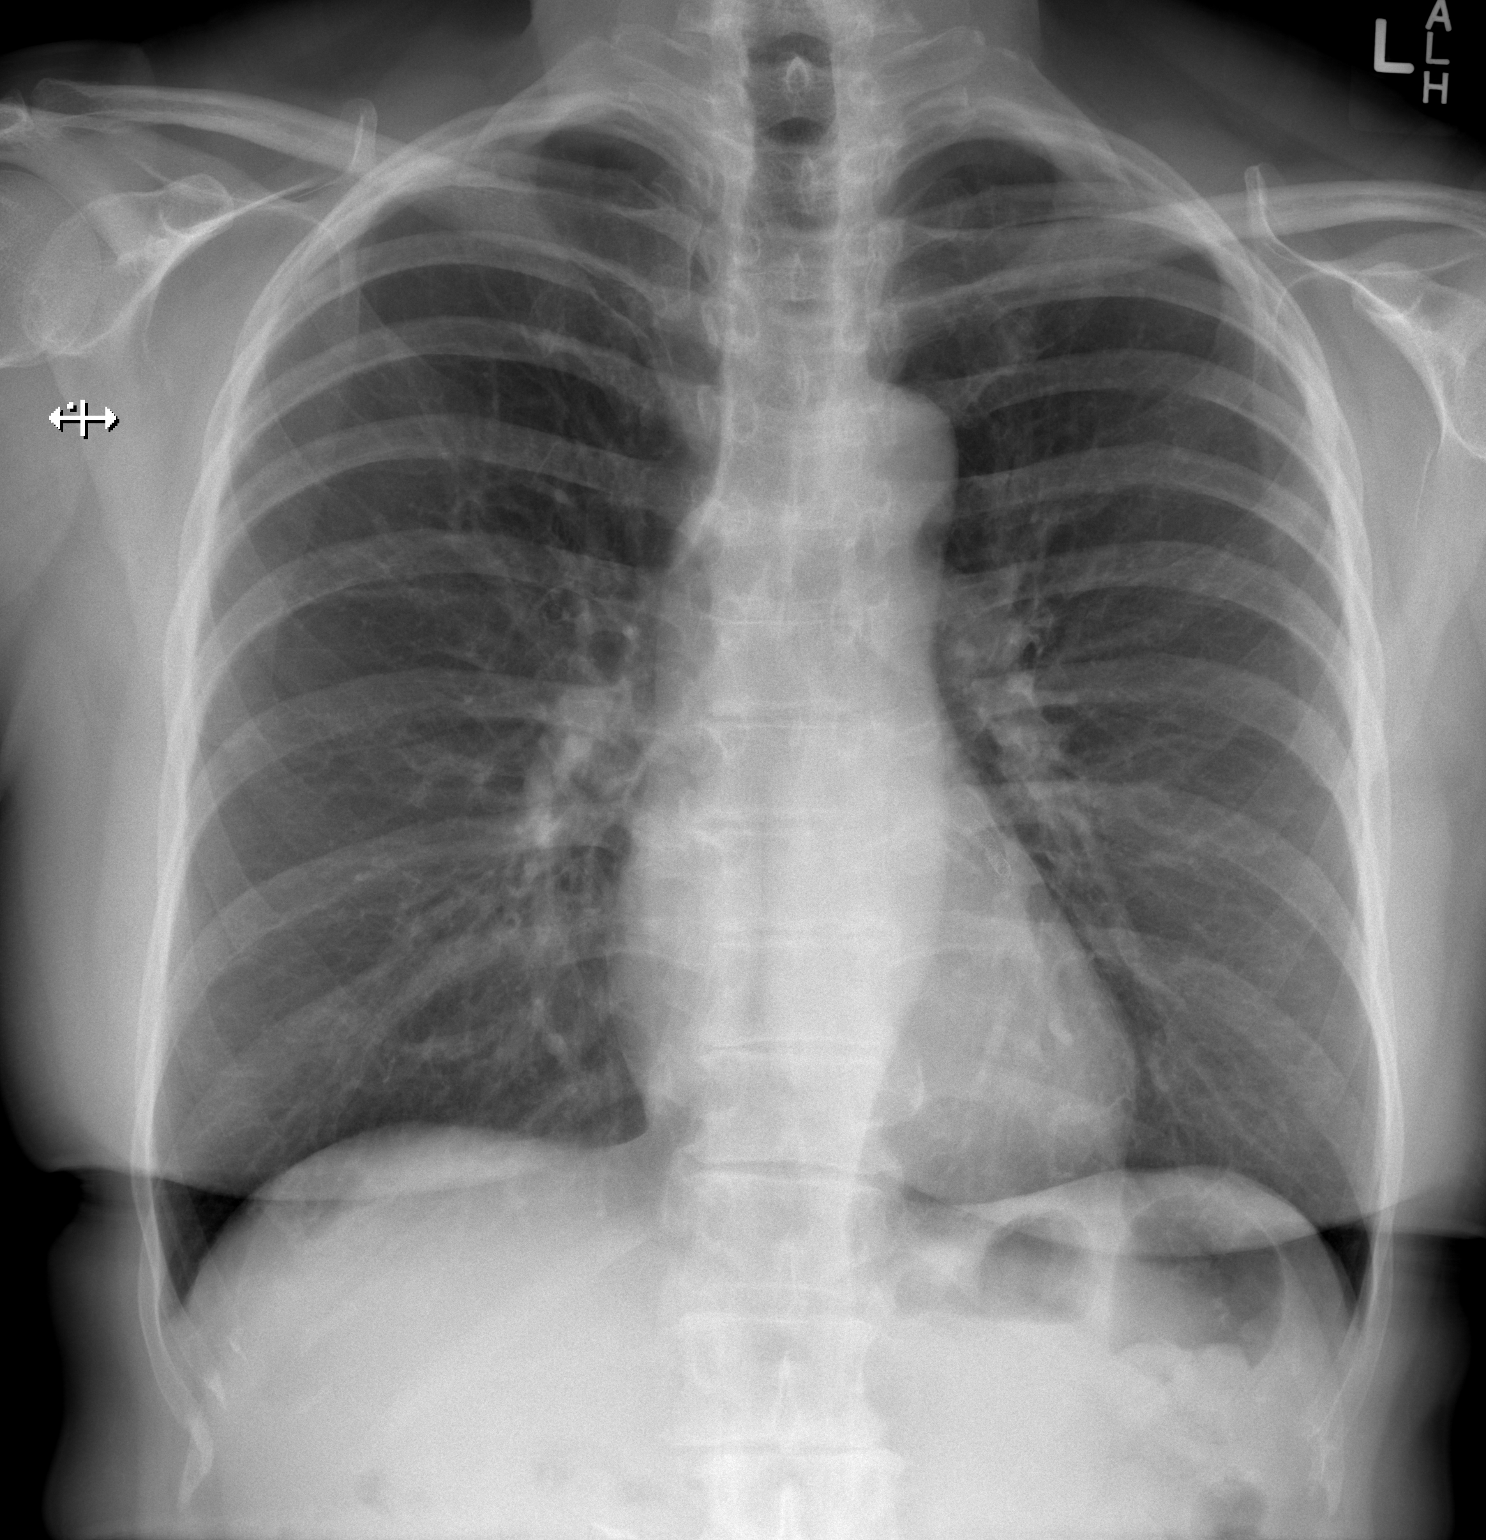

[w chest lat]
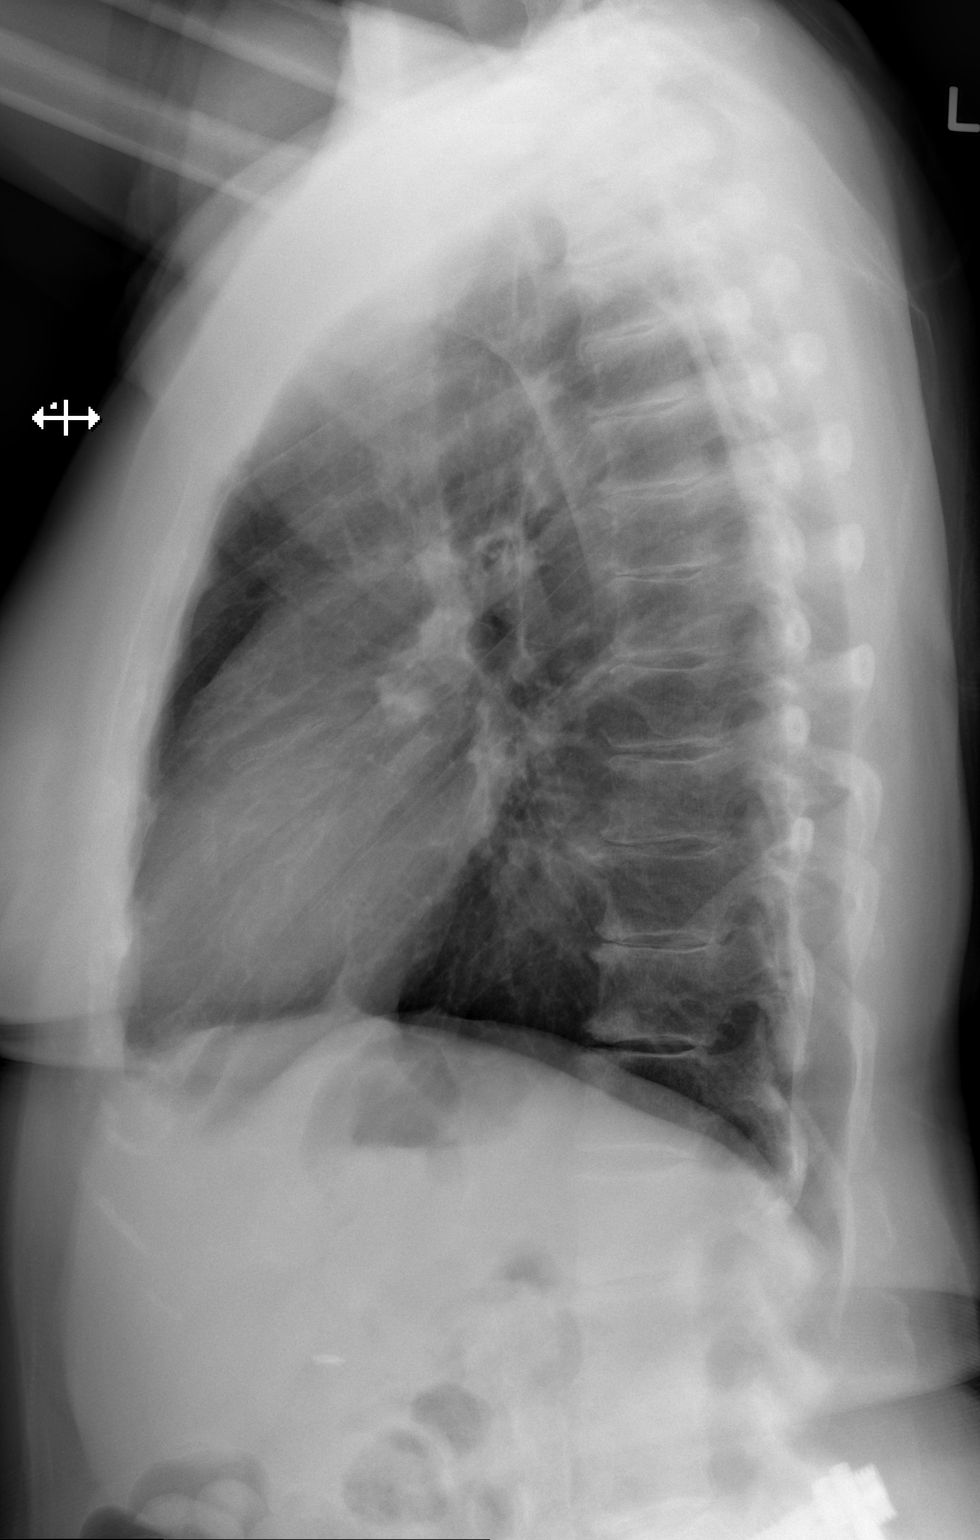

[2 of 2 positions shown; findings below may reference images not displayed]

FINDINGS: The heart size and mediastinal contours are within normal limits.
Both lungs are clear. The visualized skeletal structures are
unremarkable.
IMPRESSION: No active cardiopulmonary disease.

## 2021-11-25 DIAGNOSIS — G894 Chronic pain syndrome: Secondary | ICD-10-CM | POA: Diagnosis not present

## 2021-11-25 DIAGNOSIS — M549 Dorsalgia, unspecified: Secondary | ICD-10-CM | POA: Diagnosis not present

## 2021-11-25 DIAGNOSIS — M79642 Pain in left hand: Secondary | ICD-10-CM | POA: Diagnosis not present

## 2021-11-25 DIAGNOSIS — M25562 Pain in left knee: Secondary | ICD-10-CM | POA: Diagnosis not present

## 2021-11-25 DIAGNOSIS — M5136 Other intervertebral disc degeneration, lumbar region: Secondary | ICD-10-CM | POA: Diagnosis not present

## 2021-11-25 DIAGNOSIS — M25561 Pain in right knee: Secondary | ICD-10-CM | POA: Diagnosis not present

## 2021-11-25 DIAGNOSIS — M25511 Pain in right shoulder: Secondary | ICD-10-CM | POA: Diagnosis not present

## 2021-11-25 DIAGNOSIS — M79643 Pain in unspecified hand: Secondary | ICD-10-CM | POA: Diagnosis not present

## 2021-11-25 DIAGNOSIS — G909 Disorder of the autonomic nervous system, unspecified: Secondary | ICD-10-CM | POA: Diagnosis not present

## 2021-11-25 DIAGNOSIS — M1990 Unspecified osteoarthritis, unspecified site: Secondary | ICD-10-CM | POA: Diagnosis not present

## 2021-11-25 DIAGNOSIS — M48061 Spinal stenosis, lumbar region without neurogenic claudication: Secondary | ICD-10-CM | POA: Diagnosis not present

## 2021-11-25 DIAGNOSIS — M79641 Pain in right hand: Secondary | ICD-10-CM | POA: Diagnosis not present

## 2021-11-25 DIAGNOSIS — M25512 Pain in left shoulder: Secondary | ICD-10-CM | POA: Diagnosis not present

## 2021-11-26 DIAGNOSIS — M48062 Spinal stenosis, lumbar region with neurogenic claudication: Secondary | ICD-10-CM | POA: Diagnosis not present

## 2021-11-26 DIAGNOSIS — Z79899 Other long term (current) drug therapy: Secondary | ICD-10-CM | POA: Diagnosis not present

## 2021-11-26 DIAGNOSIS — Z79891 Long term (current) use of opiate analgesic: Secondary | ICD-10-CM | POA: Diagnosis not present

## 2021-11-26 DIAGNOSIS — G894 Chronic pain syndrome: Secondary | ICD-10-CM | POA: Diagnosis not present

## 2021-11-29 ENCOUNTER — Other Ambulatory Visit: Payer: Self-pay | Admitting: Urology

## 2021-12-02 ENCOUNTER — Other Ambulatory Visit: Payer: Self-pay

## 2021-12-02 ENCOUNTER — Encounter (HOSPITAL_BASED_OUTPATIENT_CLINIC_OR_DEPARTMENT_OTHER): Payer: Self-pay | Admitting: Urology

## 2021-12-02 DIAGNOSIS — N131 Hydronephrosis with ureteral stricture, not elsewhere classified: Secondary | ICD-10-CM | POA: Diagnosis not present

## 2021-12-02 NOTE — Progress Notes (Signed)
Spoke w/ via phone for pre-op interview---Briana Bowers needs dos----  ISTAT, EKG per anesthesia             Bowers results------08/28/21 NM Perfusion in chart & Care Everywhere, 08/10/20 chest xray in [place] test -----patient states asymptomatic no test needed Arrive at -------1230 on Friday, 12/06/21 NPO after MN NO Solid Food.  Clear liquids from MN until---1130 Med rec completed Medications to take morning of surgery -----Hydrocodone-acetaminophen prn, Robaxin prn, Protonix prn Diabetic medication -----n/a Patient instructed no nail polish to be worn day of surgery Patient instructed to bring photo id and insurance card day of surgery Patient aware to have Driver (ride ) / caregiver    for 24 hours after surgery - daughter, Briana Bowers Patient Special Instructions -----none Pre-Op special Istructions -----none Patient verbalized understanding of instructions that were given at this phone interview. Patient denies shortness of breath, chest pain, fever, cough at this phone interview.   Patient has a hx of NSTEMI in 2019, RCA stent in 1999, LAD stent in 2014, hyperlipidemia and prediabetes. Recent NM Perfusion on 08/28/21 was normal, EF 72% (in chart.)  Patient follows with cardiologist Briana Bowers, LOV 08/28/21 in Epic. She was given cardiac clearance for spinal surgery on 12/23/21 by Eliezer Mccoy, NP cardiology (in chart.) Per phone call with Dr. Lissa Hoard on 12/02/21, it is okay to use clearance for spinal surgery for cystoscopy also.

## 2021-12-06 ENCOUNTER — Encounter (HOSPITAL_BASED_OUTPATIENT_CLINIC_OR_DEPARTMENT_OTHER)
Admission: RE | Disposition: A | Discharge status: Home / Self Care | Payer: Self-pay | Source: Home / Self Care | Attending: Urology

## 2021-12-06 ENCOUNTER — Ambulatory Visit (HOSPITAL_BASED_OUTPATIENT_CLINIC_OR_DEPARTMENT_OTHER)
Admission: RE | Admit: 2021-12-06 | Discharge: 2021-12-06 | Disposition: A | Discharge status: Home / Self Care | Payer: Medicare HMO | Attending: Urology | Admitting: Urology

## 2021-12-06 ENCOUNTER — Encounter (HOSPITAL_BASED_OUTPATIENT_CLINIC_OR_DEPARTMENT_OTHER): Payer: Self-pay | Admitting: Urology

## 2021-12-06 ENCOUNTER — Ambulatory Visit (HOSPITAL_BASED_OUTPATIENT_CLINIC_OR_DEPARTMENT_OTHER): Payer: Medicare HMO | Admitting: Anesthesiology

## 2021-12-06 ENCOUNTER — Other Ambulatory Visit: Payer: Self-pay

## 2021-12-06 DIAGNOSIS — M545 Low back pain, unspecified: Secondary | ICD-10-CM | POA: Insufficient documentation

## 2021-12-06 DIAGNOSIS — Z87891 Personal history of nicotine dependence: Secondary | ICD-10-CM

## 2021-12-06 DIAGNOSIS — N131 Hydronephrosis with ureteral stricture, not elsewhere classified: Secondary | ICD-10-CM | POA: Diagnosis not present

## 2021-12-06 DIAGNOSIS — M1990 Unspecified osteoarthritis, unspecified site: Secondary | ICD-10-CM | POA: Diagnosis not present

## 2021-12-06 DIAGNOSIS — Z79899 Other long term (current) drug therapy: Secondary | ICD-10-CM | POA: Insufficient documentation

## 2021-12-06 DIAGNOSIS — N1330 Unspecified hydronephrosis: Secondary | ICD-10-CM

## 2021-12-06 DIAGNOSIS — I2510 Atherosclerotic heart disease of native coronary artery without angina pectoris: Secondary | ICD-10-CM

## 2021-12-06 DIAGNOSIS — Z79891 Long term (current) use of opiate analgesic: Secondary | ICD-10-CM | POA: Diagnosis not present

## 2021-12-06 DIAGNOSIS — Z981 Arthrodesis status: Secondary | ICD-10-CM | POA: Diagnosis not present

## 2021-12-06 DIAGNOSIS — R69 Illness, unspecified: Secondary | ICD-10-CM | POA: Diagnosis not present

## 2021-12-06 DIAGNOSIS — I252 Old myocardial infarction: Secondary | ICD-10-CM | POA: Diagnosis not present

## 2021-12-06 DIAGNOSIS — F419 Anxiety disorder, unspecified: Secondary | ICD-10-CM | POA: Diagnosis not present

## 2021-12-06 DIAGNOSIS — I10 Essential (primary) hypertension: Secondary | ICD-10-CM | POA: Diagnosis not present

## 2021-12-06 DIAGNOSIS — Z955 Presence of coronary angioplasty implant and graft: Secondary | ICD-10-CM | POA: Diagnosis not present

## 2021-12-06 DIAGNOSIS — G8929 Other chronic pain: Secondary | ICD-10-CM | POA: Insufficient documentation

## 2021-12-06 DIAGNOSIS — Z01818 Encounter for other preprocedural examination: Secondary | ICD-10-CM

## 2021-12-06 HISTORY — DX: Presence of spectacles and contact lenses: Z97.3

## 2021-12-06 HISTORY — DX: Disorder of the autonomic nervous system, unspecified: G90.9

## 2021-12-06 HISTORY — PX: CYSTOSCOPY W/ URETERAL STENT PLACEMENT: SHX1429

## 2021-12-06 HISTORY — DX: Prediabetes: R73.03

## 2021-12-06 LAB — POCT I-STAT, CHEM 8
BUN: 13 mg/dL (ref 8–23)
Calcium, Ion: 1.05 mmol/L — ABNORMAL LOW (ref 1.15–1.40)
Chloride: 100 mmol/L (ref 98–111)
Creatinine, Ser: 0.6 mg/dL (ref 0.44–1.00)
Glucose, Bld: 88 mg/dL (ref 70–99)
HCT: 40 % (ref 36.0–46.0)
Hemoglobin: 13.6 g/dL (ref 12.0–15.0)
Potassium: 3.9 mmol/L (ref 3.5–5.1)
Sodium: 136 mmol/L (ref 135–145)
TCO2: 24 mmol/L (ref 22–32)

## 2021-12-06 SURGERY — CYSTOSCOPY, WITH RETROGRADE PYELOGRAM AND URETERAL STENT INSERTION
Anesthesia: General | Site: Ureter | Laterality: Left

## 2021-12-06 MED ORDER — FENTANYL CITRATE (PF) 100 MCG/2ML IJ SOLN
INTRAMUSCULAR | Status: AC
  Filled 2021-12-06: qty 2

## 2021-12-06 MED ORDER — DEXAMETHASONE SODIUM PHOSPHATE 4 MG/ML IJ SOLN
INTRAMUSCULAR | Status: DC | PRN
  Administered 2021-12-06: 4 mg via INTRAVENOUS

## 2021-12-06 MED ORDER — PHENAZOPYRIDINE HCL 200 MG PO TABS: 200.0000 mg | ORAL_TABLET | Freq: Three times a day (TID) | ORAL | 0 refills | Status: DC | PRN

## 2021-12-06 MED ORDER — CEFAZOLIN SODIUM-DEXTROSE 2-4 GM/100ML-% IV SOLN
INTRAVENOUS | Status: AC
  Filled 2021-12-06: qty 100

## 2021-12-06 MED ORDER — FENTANYL CITRATE (PF) 100 MCG/2ML IJ SOLN
25.0000 ug | INTRAMUSCULAR | Status: DC | PRN
  Administered 2021-12-06: 25 ug via INTRAVENOUS

## 2021-12-06 MED ORDER — ONDANSETRON HCL 4 MG/2ML IJ SOLN
INTRAMUSCULAR | Status: AC
  Filled 2021-12-06: qty 10

## 2021-12-06 MED ORDER — SODIUM CHLORIDE 0.9 % IR SOLN
Status: DC | PRN
  Administered 2021-12-06: 3000 mL

## 2021-12-06 MED ORDER — EPHEDRINE 5 MG/ML INJ
INTRAVENOUS | Status: AC
  Filled 2021-12-06: qty 10

## 2021-12-06 MED ORDER — 0.9 % SODIUM CHLORIDE (POUR BTL) OPTIME
TOPICAL | Status: DC | PRN
  Administered 2021-12-06: 500 mL

## 2021-12-06 MED ORDER — LIDOCAINE HCL (CARDIAC) PF 100 MG/5ML IV SOSY
PREFILLED_SYRINGE | INTRAVENOUS | Status: DC | PRN
  Administered 2021-12-06: 40 mg via INTRAVENOUS

## 2021-12-06 MED ORDER — LACTATED RINGERS IV SOLN
INTRAVENOUS | Status: DC
  Administered 2021-12-06: 1000 mL via INTRAVENOUS

## 2021-12-06 MED ORDER — ACETAMINOPHEN 10 MG/ML IV SOLN: 1000.0000 mg | Freq: Once | INTRAVENOUS | Status: DC | PRN

## 2021-12-06 MED ORDER — FENTANYL CITRATE (PF) 100 MCG/2ML IJ SOLN
INTRAMUSCULAR | Status: DC | PRN
Start: 2021-12-06 — End: 2021-12-06
  Administered 2021-12-06 (×2): 25 ug via INTRAVENOUS

## 2021-12-06 MED ORDER — PROPOFOL 10 MG/ML IV BOLUS
INTRAVENOUS | Status: DC | PRN
  Administered 2021-12-06: 80 mg via INTRAVENOUS

## 2021-12-06 MED ORDER — IOHEXOL 300 MG/ML  SOLN
INTRAMUSCULAR | Status: DC | PRN
  Administered 2021-12-06: 20 mL via URETHRAL

## 2021-12-06 MED ORDER — ONDANSETRON HCL 4 MG/2ML IJ SOLN
INTRAMUSCULAR | Status: DC | PRN
  Administered 2021-12-06: 4 mg via INTRAVENOUS

## 2021-12-06 MED ORDER — OXYBUTYNIN CHLORIDE 5 MG PO TABS: 5.0000 mg | ORAL_TABLET | Freq: Three times a day (TID) | ORAL | 1 refills | Status: DC | PRN

## 2021-12-06 MED ORDER — CEFAZOLIN SODIUM-DEXTROSE 2-4 GM/100ML-% IV SOLN
2.0000 g | Freq: Once | INTRAVENOUS | Status: AC
  Administered 2021-12-06: 2 g via INTRAVENOUS

## 2021-12-06 MED ORDER — DEXAMETHASONE SODIUM PHOSPHATE 10 MG/ML IJ SOLN
INTRAMUSCULAR | Status: AC
  Filled 2021-12-06: qty 4

## 2021-12-06 MED ORDER — PHENYLEPHRINE 80 MCG/ML (10ML) SYRINGE FOR IV PUSH (FOR BLOOD PRESSURE SUPPORT)
PREFILLED_SYRINGE | INTRAVENOUS | Status: AC
  Filled 2021-12-06: qty 20

## 2021-12-06 SURGICAL SUPPLY — 19 items
BAG DRAIN URO-CYSTO SKYTR STRL (DRAIN) ×1 IMPLANT
BAG DRN UROCATH (DRAIN) ×1
CATH URETL OPEN 5X70 (CATHETERS) IMPLANT
CLOTH BEACON ORANGE TIMEOUT ST (SAFETY) ×1 IMPLANT
GLOVE BIO SURGEON STRL SZ7 (GLOVE) IMPLANT
GLOVE BIO SURGEON STRL SZ7.5 (GLOVE) ×1 IMPLANT
GLOVE BIOGEL PI IND STRL 7.0 (GLOVE) IMPLANT
GLOVE BIOGEL PI INDICATOR 7.0 (GLOVE) ×1
GOWN STRL REUS W/TWL LRG LVL3 (GOWN DISPOSABLE) IMPLANT
GOWN STRL REUS W/TWL XL LVL3 (GOWN DISPOSABLE) ×1 IMPLANT
GUIDEWIRE ZIPWRE .038 STRAIGHT (WIRE) ×1 IMPLANT
IV NS IRRIG 3000ML ARTHROMATIC (IV SOLUTION) ×2 IMPLANT
KIT TURNOVER CYSTO (KITS) ×1 IMPLANT
MANIFOLD NEPTUNE II (INSTRUMENTS) ×1 IMPLANT
NS IRRIG 500ML POUR BTL (IV SOLUTION) ×1 IMPLANT
PACK CYSTO (CUSTOM PROCEDURE TRAY) ×1 IMPLANT
STENT URET 6FRX24 CONTOUR (STENTS) IMPLANT
SYR 10ML LL (SYRINGE) ×1 IMPLANT
TUBE CONNECTING 12X1/4 (SUCTIONS) IMPLANT

## 2021-12-06 NOTE — Discharge Instructions (Signed)

## 2021-12-06 NOTE — H&P (Signed)
Office Visit Report     12/02/2021   --------------------------------------------------------------------------------   Briana [place]. [place]  MRN: [id number]  DOB: 09-28-1950, 71 year [name]    PRIMARY CARE:  Minette Brine, MD  REFERRING:  Glena Norfolk. Lovena Neighbours, MD  PROVIDER:  Ellison Hughs, M.D.  TREATING:  Jiles Crocker, NP  LOCATION:  [facility] [phone]     --------------------------------------------------------------------------------   CC/HPI: Left hydronephrosis   Briana Bowers is a 71 year [name] who was found to have left-sided hydronephrosis on MRI lumbar spine during evaluation for ongoing back pain. She has a long history of chronic back pain requiring opiate pain medication and multiple surgeries. She is s/p L3-4 OLIF on 11/20/2020.   No left-sided hydronephrosis is seen or documented on MRI L-spine from 08/18/2020 CT L-spine from 02/26/2020.   MRI L-spine from 04/24/2021 reveals moderate left-sided hydroureteronephrosis with a transition point in the vicinity of her L3-L4 fusion. 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   07/04/2021: The patient is here today for a routine follow-up. Recent CT abdomen shows moderate left-sided hydronephrosis with signs of tapering of the ureter at the level of her spinal fusion hardware. The radiologist noted that these findings likely reflect ureteral foreshortening and tortuosity with mild extrinsic compression from the lumbar fusion hardware. Both kidneys excrete contrast readily. Her Lasix renogram showed a 50/50 split renal function with a 14-minute delay of excretion involving the left kidney but no obvious or concerning signs of obstruction. The patient denies interval left-sided flank pain, nausea/vomiting, dysuria or hematuria.   12/02/2021: Patient was to undergo cystoscopy with left retrograde pyelogram and stent placement with Dr. Lovena Neighbours to occur approximately 2 weeks  prior to pending spine surgery back in the spring of this year. This was canceled due to uncertainty of timing of when her back surgery was supposed to occur. Ureteral stent placement now scheduled for 8/25 with Dr. Lovena Neighbours. Her back surgery is now scheduled for 9/11. Patient here today for preoperative appointment. Patient denies any interval recurrence of left-sided pain or discomfort suggestive of obstructive uropathy. She denies any interval dysuria, gross hematuria or interval treatment for UTI. Her voiding symptoms at baseline remain stable with non-bothersome daytime frequency/urgency, occasional urge incontinence and nocturia. She has had no changes in past medical history, prescription medications taken on daily basis, no interval surgical or procedural interventions. She denies any recent chest pain, shortness of breath, lightheadedness or dizziness.     ALLERGIES: Cipro Levaquin    MEDICATIONS: Amoxicillin 875 mg tablet  Aspirin Regimen 81 mg tablet, delayed release  Cetirizine Hcl 10 mg tablet,chewable  Flonase Allergy Relief  Hydrocodone-Acetaminophen 10 mg-325 mg tablet  Meloxicam PRN  Methotrexate  Nortriptyline Hcl 50 mg capsule  Prednisone  Rosuvastatin Calcium  Testosterone     GU PSH: Locm 300-'399Mg'$ /Ml Iodine,1Ml - 06/25/2021       PSH Notes: tonsillectomy, hysterectomy, appendectomy, lap cholecystectomy, 4 spinal surgeries, ganglion cyst removed, tuba ligation   NON-GU PSH: No Non-GU PSH    GU PMH: Low back pain - 07/04/2021 Ureteral obstruction - 07/04/2021, - 06/25/2021, - 06/14/2021    NON-GU PMH: Hypercholesterolemia    FAMILY HISTORY: 1 Daughter - Daughter   SOCIAL HISTORY: Marital Status: Married Preferred Language: English; Ethnicity: Not Hispanic Or Latino; Race: White Current Smoking Status: Patient has never smoked.   Tobacco Use Assessment Completed: Used Tobacco in last 30 days? Has never drank.  Does not drink caffeine.  REVIEW OF SYSTEMS:     GU Review Female:   Patient reports frequent urination, hard to postpone urination, get up at night to urinate, and leakage of urine. Patient denies burning /pain with urination, stream starts and stops, trouble starting your stream, have to strain to urinate, and being pregnant.  Gastrointestinal (Upper):   Patient denies nausea, vomiting, and indigestion/ heartburn.  Gastrointestinal (Lower):   Patient denies diarrhea and constipation.  Constitutional:   Patient denies fever, night sweats, weight loss, and fatigue.  Skin:   Patient denies skin rash/ lesion and itching.  Eyes:   Patient denies blurred vision and double vision.  Ears/ Nose/ Throat:   Patient denies sore throat and sinus problems.  Hematologic/Lymphatic:   Patient denies easy bruising and swollen glands.  Cardiovascular:   Patient denies leg swelling and chest pains.  Respiratory:   Patient denies cough and shortness of breath.  Endocrine:   Patient denies excessive thirst.  Musculoskeletal:   Patient reports back pain. Patient denies joint pain.  Neurological:   Patient denies headaches and dizziness.  Psychologic:   Patient denies depression and anxiety.   Notes: Updated from previous visit 06/14/2021 with review from patient as noted above.   VITAL SIGNS:      12/02/2021 02:45 PM  BP 128/73 mmHg  Pulse 91 /min  Temperature 97.5 F / 36.3 C   MULTI-SYSTEM PHYSICAL EXAMINATION:    Constitutional: Well-nourished. No physical deformities. Normally developed. Good grooming.  Neck: Neck symmetrical, not swollen. Normal tracheal position.  Respiratory: No labored breathing, no use of accessory muscles.   Cardiovascular: Normal temperature, normal extremity pulses, no swelling, no varicosities.  Skin: No paleness, no jaundice, no cyanosis. No lesion, no ulcer, no rash.  Neurologic / Psychiatric: Oriented to time, oriented to place, oriented to person. No depression, no anxiety, no agitation.  Gastrointestinal: No mass, no  tenderness, no rigidity, non obese abdomen.  Musculoskeletal: Normal gait and station of head and neck.     Complexity of Data:  Source Of History:  Patient, Medical Record Summary  Lab Test Review:   BMP  Records Review:   Previous Doctor Records, Previous Hospital Records, Previous Patient Records  Urine Test Review:   Urinalysis, Urine Culture  X-Ray Review: C.T. Abdomen/Pelvis: Reviewed Films. Reviewed Report.  Lasix Renogram: Reviewed Report.     12/02/21  Urinalysis  Urine Appearance Clear   Urine Color Yellow   Urine Glucose Neg mg/dL  Urine Bilirubin Neg mg/dL  Urine Ketones Neg mg/dL  Urine Specific Gravity <=1.005   Urine Blood Neg ery/uL  Urine pH 7.5   Urine Protein Neg mg/dL  Urine Urobilinogen 0.2 mg/dL  Urine Nitrites Neg   Urine Leukocyte Esterase Neg leu/uL   PROCEDURES:          Urinalysis Dipstick Dipstick Cont'd  Color: Yellow Bilirubin: Neg mg/dL  Appearance: Clear Ketones: Neg mg/dL  Specific Gravity: <=1.005 Blood: Neg ery/uL  pH: 7.5 Protein: Neg mg/dL  Glucose: Neg mg/dL Urobilinogen: 0.2 mg/dL    Nitrites: Neg    Leukocyte Esterase: Neg leu/uL    ASSESSMENT:      ICD-10 Details  1 GU:   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 Urine culture sent today to serve as a baseline. She will proceed with previously scheduled cystoscopy with left retrograde pyelogram and ureteral stent placement on 8/25 with Dr. Lovena Neighbours.   -The risks, benefits and alternatives of cystoscopy with LEFT JJ stent placement  was discussed with the patient. Risks include, but are not limited to: bleeding, urinary tract infection, ureteral injury, ureteral stricture disease, chronic pain, urinary symptoms, bladder injury, stent migration, the need for nephrostomy tube placement, MI, CVA, DVT, PE and the inherent risks with general anesthesia. The patient voices understanding and wishes to proceed.

## 2021-12-06 NOTE — Op Note (Signed)
Operative Note  Preoperative diagnosis:  1.  Left hydronephrosis  Postoperative diagnosis: 1.  Left hydronephrosis  Procedure(s): 1.  Cystoscopy with left ureteral stent placement 2.  Left retrograde pyelogram with intraoperative interpretation fluoroscopic imaging  Surgeon: Ellison Hughs, MD  Assistants:  None  Anesthesia:  General  Complications:  None  EBL: Less than 5 mL  Specimens: 1.  None  Drains/Catheters: 1.  Left 6 French, 24 cm JJ stent without tether  Intraoperative findings:   Left retrograde pyelogram revealed stenosis of the mid to proximal region of the left ureter as it passed through her lumbar spine hardware.  There was medial deviation of her left ureter along with mild to moderate dilation of the left renal pelvis.  No other filling defects were identified. No intravesical abnormalities were seen  Indication:  Briana Bowers is a 71 y.o. female with left-sided hydronephrosis likely secondary to lumbar spine hardware causing extrinsic compression of the mid to proximal portion of the ureter.  She is scheduled to undergo revision of her lumbar fusion in the coming weeks and is here today for left ureteral stent placement to aid in identification during her back surgery.  She has been consented for the above procedures, voices understanding wish to proceed.  Description of procedure:  After informed consent was obtained, the patient was brought to the operating room and general LMA anesthesia was administered. The patient was then placed in the dorsolithotomy position and prepped and draped in the usual sterile fashion. A timeout was performed. A 23 French rigid cystoscope was then inserted into the urethral meatus and advanced into the bladder under direct vision. A complete bladder survey revealed no intravesical pathology.  A 5 French ureteral catheter was then inserted into the left ureteral orifice and a retrograde pyelogram was obtained, with the  findings listed above.  A Glidewire was then used to intubate the lumen of the ureteral catheter and was advanced up to the left renal pelvis, under fluoroscopic guidance.  The catheter was then removed, leaving the wire in place.  A 6 French, 24 cm JJ stent was then advanced over the wire and into position within the left collecting system, confirming placement via fluoroscopy.  The patient's bladder was drained.  She tolerated the procedure well and was transferred to the postanesthesia in stable condition.  Plan: Postop appointment on 01/06/2022

## 2021-12-06 NOTE — Anesthesia Procedure Notes (Addendum)
Procedure Name: LMA Insertion Date/Time: 12/06/2021 1:51 PM  Performed by: Georgeanne Nim, CRNAPre-anesthesia Checklist: Patient identified, Emergency Drugs available, Suction available, Patient being monitored and Timeout performed Patient Re-evaluated:Patient Re-evaluated prior to induction Oxygen Delivery Method: Circle system utilized Preoxygenation: Pre-oxygenation with 100% oxygen Induction Type: IV induction Ventilation: Mask ventilation without difficulty LMA: LMA inserted LMA Size: 3.0 Number of attempts: 1 Placement Confirmation: positive ETCO2, CO2 detector and breath sounds checked- equal and bilateral Tube secured with: Tape Dental Injury: Teeth and Oropharynx as per pre-operative assessment

## 2021-12-06 NOTE — Transfer of Care (Signed)
Immediate Anesthesia Transfer of Care Note  Patient: Briana Bowers  Procedure(s) Performed: CYSTOSCOPY WITH RETROGRADE PYELOGRAM/URETERAL STENT PLACEMENT (Left: Ureter)  Patient Location: PACU  Anesthesia Type:General  Level of Consciousness: awake, alert , oriented and patient cooperative  Airway & Oxygen Therapy: Patient Spontanous Breathing and Patient connected to nasal cannula oxygen  Post-op Assessment: Report given to RN and Post -op Vital signs reviewed and stable  Post vital signs: Reviewed and stable  Last Vitals:  Vitals Value Taken Time  BP 136/67 12/06/21 1422  Temp    Pulse 78 12/06/21 1423  Resp    SpO2 100 % 12/06/21 1423  Vitals shown include unvalidated device data.  Last Pain:  Vitals:   12/06/21 1310  TempSrc: Oral  PainSc: 4       Patients Stated Pain Goal: 8 (83/50/75 [id number])  Complications: No notable events documented.

## 2021-12-06 NOTE — Anesthesia Preprocedure Evaluation (Signed)
Anesthesia Evaluation  Patient identified by MRN, date of birth, ID band Patient awake    Reviewed: Allergy & Precautions, NPO status , Patient's Chart, lab work & pertinent test results  Airway Mallampati: II  TM Distance: >3 FB Neck ROM: Full    Dental no notable dental hx.    Pulmonary neg pulmonary ROS, former smoker,    Pulmonary exam normal        Cardiovascular hypertension, Pt. on medications and Pt. on home beta blockers + CAD, + Past MI and + Cardiac Stents   Rhythm:Regular Rate:Normal     Neuro/Psych Anxiety negative neurological ROS     GI/Hepatic Neg liver ROS, hiatal hernia, GERD  Medicated,  Endo/Other  negative endocrine ROS  Renal/GU negative Renal ROS  negative genitourinary   Musculoskeletal  (+) Arthritis , Osteoarthritis,    Abdominal Normal abdominal exam  (+)   Peds  Hematology negative hematology ROS (+)   Anesthesia Other Findings   Reproductive/Obstetrics                             Anesthesia Physical Anesthesia Plan  ASA: 3  Anesthesia Plan: General   Post-op Pain Management:    Induction: Intravenous  PONV Risk Score and Plan: 3 and Ondansetron, Dexamethasone and Treatment may vary due to age or medical condition  Airway Management Planned: Mask and LMA  Additional Equipment: None  Intra-op Plan:   Post-operative Plan: Extubation in OR  Informed Consent: I have reviewed the patients History and Physical, chart, labs and discussed the procedure including the risks, benefits and alternatives for the proposed anesthesia with the patient or authorized representative who has indicated his/her understanding and acceptance.     Dental advisory given  Plan Discussed with:   Anesthesia Plan Comments:         Anesthesia Quick Evaluation

## 2021-12-07 NOTE — Anesthesia Postprocedure Evaluation (Signed)
Anesthesia Post Note  Patient: Briana Bowers  Procedure(s) Performed: CYSTOSCOPY WITH RETROGRADE PYELOGRAM/URETERAL STENT PLACEMENT (Left: Ureter)     Patient location during evaluation: PACU Anesthesia Type: General Level of consciousness: awake and alert Pain management: pain level controlled Vital Signs Assessment: post-procedure vital signs reviewed and stable Respiratory status: spontaneous breathing, nonlabored ventilation, respiratory function stable and patient connected to nasal cannula oxygen Cardiovascular status: blood pressure returned to baseline and stable Postop Assessment: no apparent nausea or vomiting Anesthetic complications: no   No notable events documented.  Last Vitals:  Vitals:   12/06/21 1500 12/06/21 1549  BP: (!) 123/59 128/65  Pulse: 71 70  Resp: (!) 9 14  Temp:  36.4 C  SpO2: 97% 97%    Last Pain:  Vitals:   12/06/21 1549  TempSrc:   PainSc: 4                  Donelda Mailhot P Kiyaan Haq

## 2021-12-09 ENCOUNTER — Encounter (HOSPITAL_BASED_OUTPATIENT_CLINIC_OR_DEPARTMENT_OTHER): Payer: Self-pay | Admitting: Urology

## 2021-12-10 DIAGNOSIS — M1990 Unspecified osteoarthritis, unspecified site: Secondary | ICD-10-CM | POA: Diagnosis not present

## 2021-12-10 DIAGNOSIS — Z79890 Hormone replacement therapy (postmenopausal): Secondary | ICD-10-CM | POA: Diagnosis not present

## 2021-12-10 DIAGNOSIS — G4700 Insomnia, unspecified: Secondary | ICD-10-CM | POA: Diagnosis not present

## 2021-12-10 DIAGNOSIS — M4800 Spinal stenosis, site unspecified: Secondary | ICD-10-CM | POA: Diagnosis not present

## 2021-12-10 DIAGNOSIS — R69 Illness, unspecified: Secondary | ICD-10-CM | POA: Diagnosis not present

## 2021-12-10 DIAGNOSIS — R0602 Shortness of breath: Secondary | ICD-10-CM | POA: Diagnosis not present

## 2021-12-10 DIAGNOSIS — K589 Irritable bowel syndrome without diarrhea: Secondary | ICD-10-CM | POA: Diagnosis not present

## 2021-12-10 DIAGNOSIS — E785 Hyperlipidemia, unspecified: Secondary | ICD-10-CM | POA: Diagnosis not present

## 2021-12-10 DIAGNOSIS — J301 Allergic rhinitis due to pollen: Secondary | ICD-10-CM | POA: Diagnosis not present

## 2021-12-10 DIAGNOSIS — I2510 Atherosclerotic heart disease of native coronary artery without angina pectoris: Secondary | ICD-10-CM | POA: Diagnosis not present

## 2021-12-10 DIAGNOSIS — Z7982 Long term (current) use of aspirin: Secondary | ICD-10-CM | POA: Diagnosis not present

## 2021-12-10 DIAGNOSIS — Z791 Long term (current) use of non-steroidal anti-inflammatories (NSAID): Secondary | ICD-10-CM | POA: Diagnosis not present

## 2021-12-12 DIAGNOSIS — Z6824 Body mass index (BMI) 24.0-24.9, adult: Secondary | ICD-10-CM | POA: Diagnosis not present

## 2021-12-12 DIAGNOSIS — M5116 Intervertebral disc disorders with radiculopathy, lumbar region: Secondary | ICD-10-CM | POA: Diagnosis not present

## 2021-12-12 DIAGNOSIS — M532X6 Spinal instabilities, lumbar region: Secondary | ICD-10-CM | POA: Diagnosis not present

## 2021-12-12 DIAGNOSIS — M48062 Spinal stenosis, lumbar region with neurogenic claudication: Secondary | ICD-10-CM | POA: Diagnosis not present

## 2021-12-13 IMAGING — MG DIGITAL DIAGNOSTIC BILAT W/ TOMO W/ CAD
6 of 10 series · 6 of 30 positions shown · non-contrast
Comparison: Previous exam(s).

CLINICAL DATA: 69-year-old female who states her right nipple has
always been inverted, complains of thick white nipple discharge for
many years most when expressed.

EXAM:
DIGITAL DIAGNOSTIC BILATERAL MAMMOGRAM WITH TOMOSYNTHESIS AND CAD;
ULTRASOUND RIGHT BREAST LIMITED
TECHNIQUE: Bilateral digital diagnostic mammography and breast tomosynthesis
was performed. The images were evaluated with computer-aided
detection.; Targeted ultrasound examination of the right breast was
performed

[L CC synth-2D]
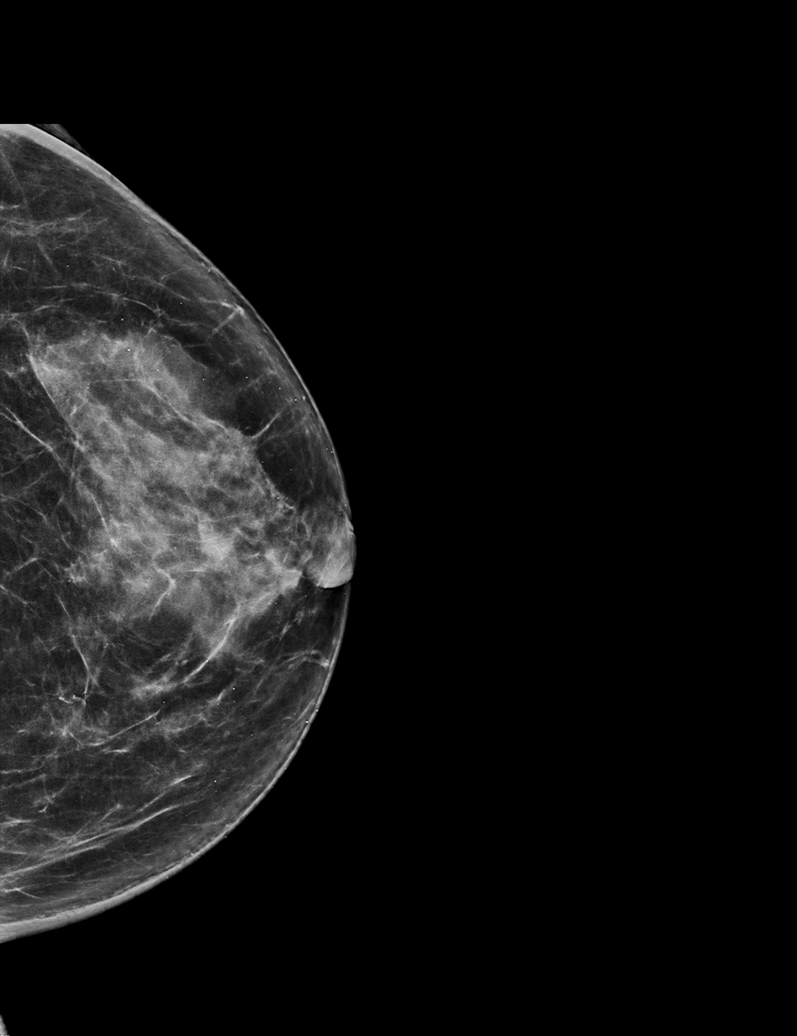

[R MLO synth-2D (1 of 2)]
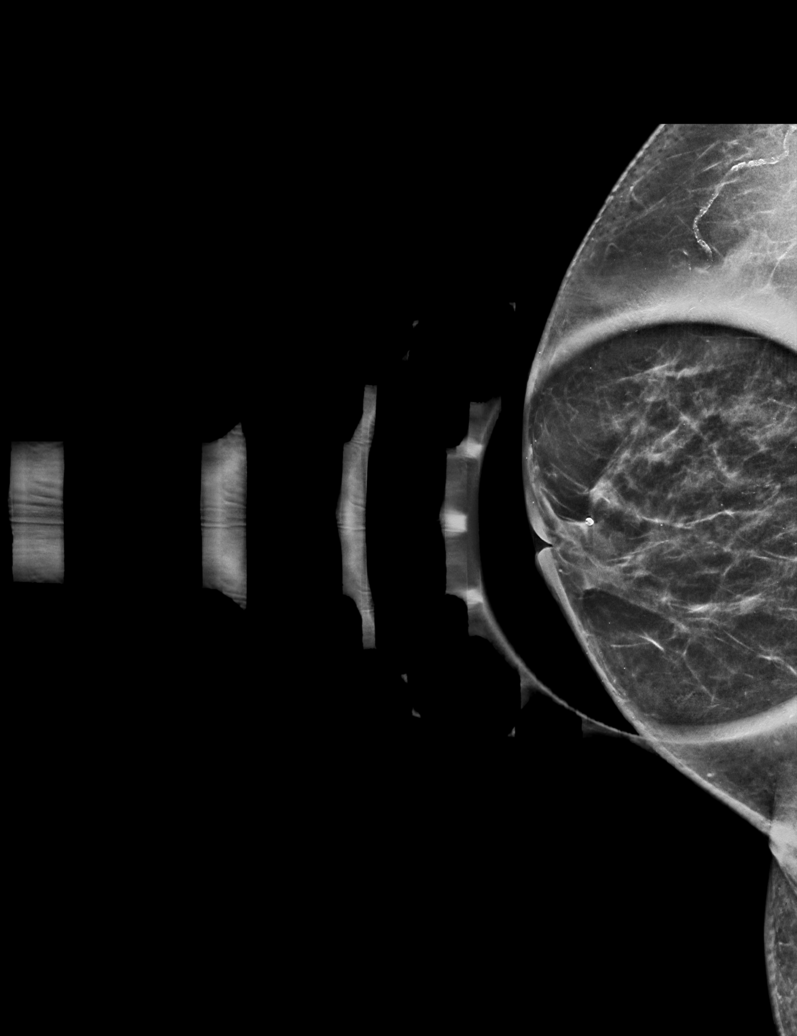

[R MLO synth-2D (2 of 2)]
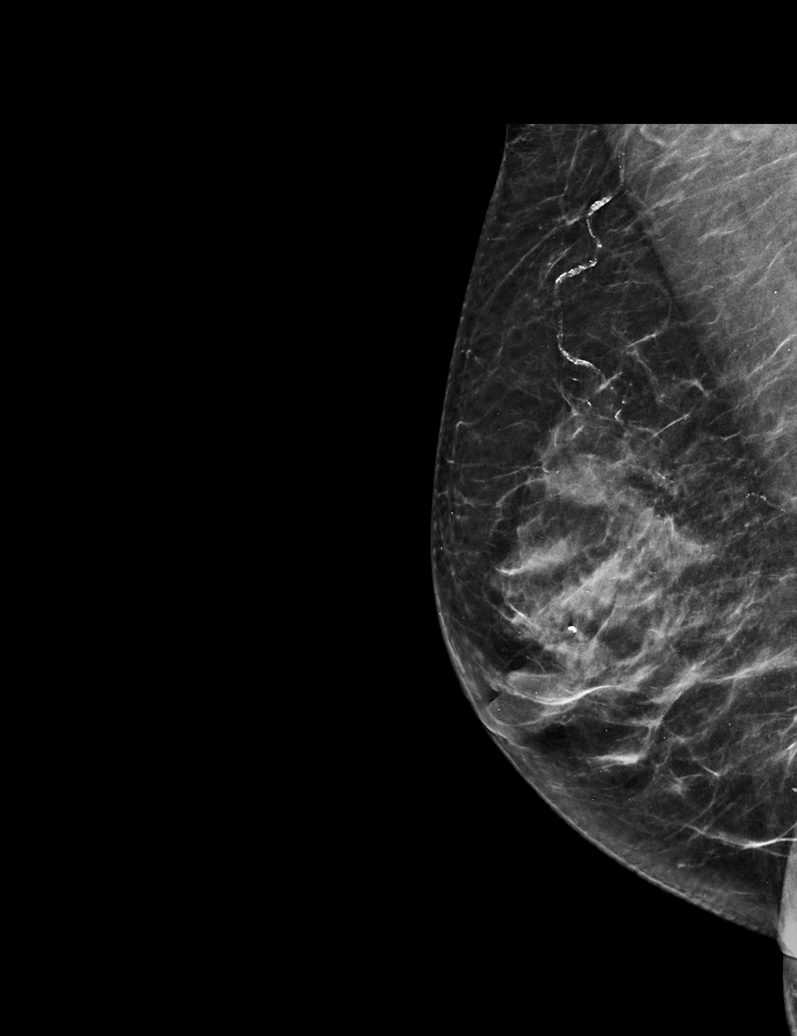

[R CC synth-2D]
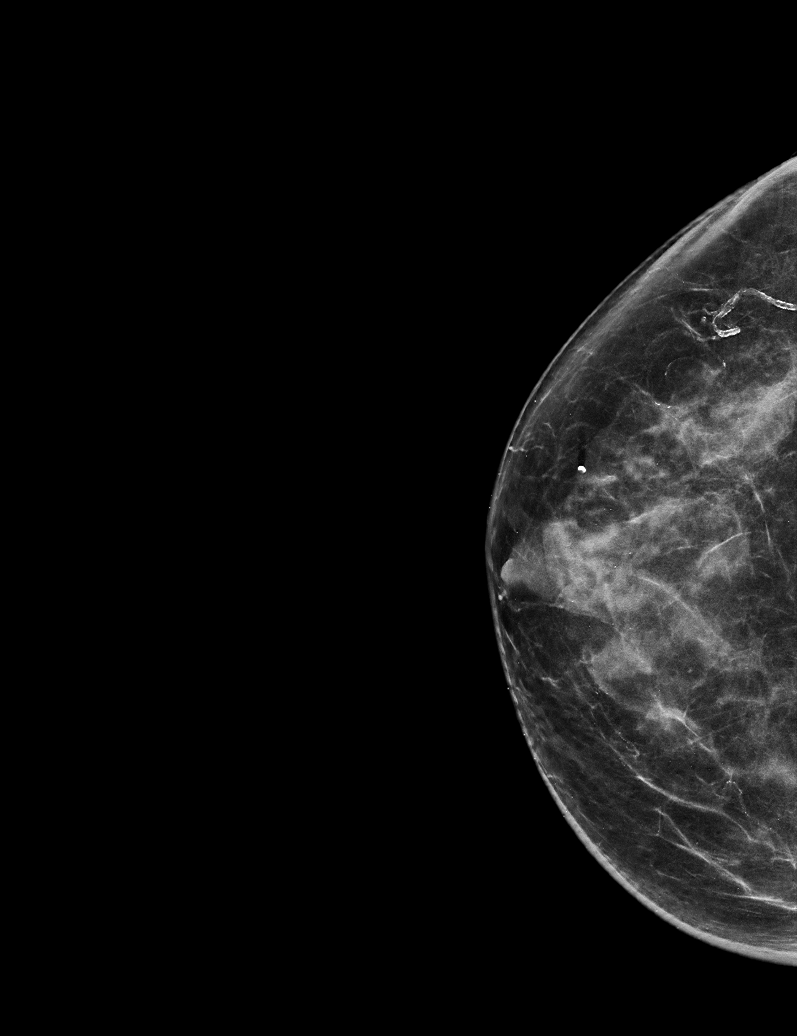

[L MLO synth-2D]
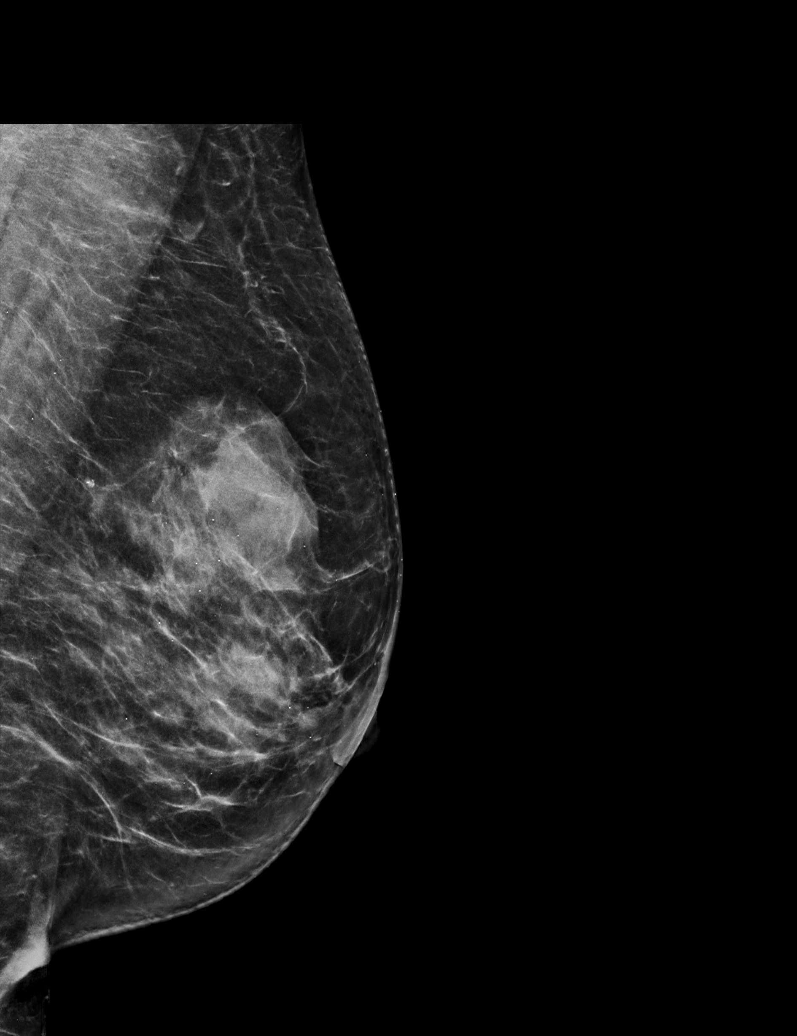

[R MLO tomo · tomo slice 28/55.0]
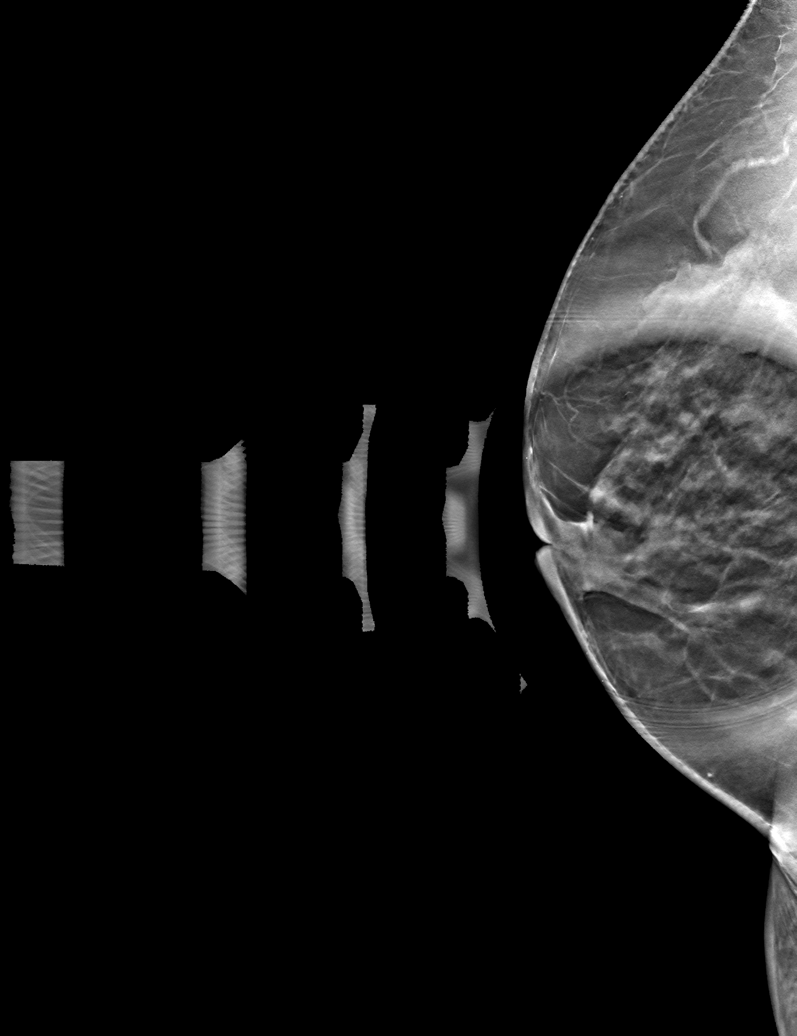

[6 of 30 positions shown; findings below may reference images not displayed]

ACR Breast Density Category d: The breast tissue is extremely dense,
which lowers the sensitivity of mammography.
FINDINGS: No suspicious mass, malignant type microcalcifications or distortion
detected in either breast. Spot tangential view of the retroareolar
region of the right breast shows normal fibroglandular tissue.

On physical exam, no mass is palpated in the retroareolar region of
the right breast. The patient can express thick white nipple
discharge.

Targeted ultrasound is performed, showing normal tissue in the
retroareolar region of the right breast. There is no intraductal
mass, suspicious mass or abnormal shadowing.
IMPRESSION: No evidence of malignancy in either breast.

RECOMMENDATION:
Bilateral screening mammogram in 1 year is recommended.

Concerning nipple discharge was discussed with the patient. If the
patient develops concerning nipple discharge further evaluation with
MRI and surgical consultation would be recommended.

I have discussed the findings and recommendations with the patient.
If applicable, a reminder letter will be sent to the patient
regarding the next appointment.

BI-RADS CATEGORY  1: Negative.

## 2021-12-19 DIAGNOSIS — Z01812 Encounter for preprocedural laboratory examination: Secondary | ICD-10-CM | POA: Diagnosis not present

## 2021-12-19 DIAGNOSIS — Z5181 Encounter for therapeutic drug level monitoring: Secondary | ICD-10-CM | POA: Diagnosis not present

## 2021-12-19 DIAGNOSIS — R7989 Other specified abnormal findings of blood chemistry: Secondary | ICD-10-CM | POA: Diagnosis not present

## 2021-12-19 DIAGNOSIS — Z79899 Other long term (current) drug therapy: Secondary | ICD-10-CM | POA: Diagnosis not present

## 2021-12-19 DIAGNOSIS — M48062 Spinal stenosis, lumbar region with neurogenic claudication: Secondary | ICD-10-CM | POA: Diagnosis not present

## 2021-12-23 DIAGNOSIS — Z981 Arthrodesis status: Secondary | ICD-10-CM | POA: Diagnosis not present

## 2021-12-23 DIAGNOSIS — M4185 Other forms of scoliosis, thoracolumbar region: Secondary | ICD-10-CM | POA: Diagnosis not present

## 2021-12-23 DIAGNOSIS — M960 Pseudarthrosis after fusion or arthrodesis: Secondary | ICD-10-CM | POA: Diagnosis not present

## 2021-12-23 DIAGNOSIS — M48062 Spinal stenosis, lumbar region with neurogenic claudication: Secondary | ICD-10-CM | POA: Diagnosis not present

## 2021-12-23 DIAGNOSIS — M4326 Fusion of spine, lumbar region: Secondary | ICD-10-CM | POA: Diagnosis not present

## 2021-12-23 DIAGNOSIS — D62 Acute posthemorrhagic anemia: Secondary | ICD-10-CM | POA: Diagnosis not present

## 2021-12-23 DIAGNOSIS — M4010 Other secondary kyphosis, site unspecified: Secondary | ICD-10-CM | POA: Diagnosis not present

## 2021-12-23 DIAGNOSIS — M4015 Other secondary kyphosis, thoracolumbar region: Secondary | ICD-10-CM | POA: Diagnosis not present

## 2021-12-23 DIAGNOSIS — M4714 Other spondylosis with myelopathy, thoracic region: Secondary | ICD-10-CM | POA: Diagnosis not present

## 2021-12-23 DIAGNOSIS — I2510 Atherosclerotic heart disease of native coronary artery without angina pectoris: Secondary | ICD-10-CM | POA: Diagnosis not present

## 2021-12-23 DIAGNOSIS — M4302 Spondylolysis, cervical region: Secondary | ICD-10-CM | POA: Diagnosis not present

## 2021-12-23 DIAGNOSIS — M5116 Intervertebral disc disorders with radiculopathy, lumbar region: Secondary | ICD-10-CM | POA: Diagnosis not present

## 2021-12-23 DIAGNOSIS — M4805 Spinal stenosis, thoracolumbar region: Secondary | ICD-10-CM | POA: Diagnosis not present

## 2021-12-23 DIAGNOSIS — M4305 Spondylolysis, thoracolumbar region: Secondary | ICD-10-CM | POA: Diagnosis not present

## 2021-12-23 DIAGNOSIS — Z7982 Long term (current) use of aspirin: Secondary | ICD-10-CM | POA: Diagnosis not present

## 2021-12-23 DIAGNOSIS — M532X6 Spinal instabilities, lumbar region: Secondary | ICD-10-CM | POA: Diagnosis not present

## 2021-12-26 DIAGNOSIS — M5116 Intervertebral disc disorders with radiculopathy, lumbar region: Secondary | ICD-10-CM | POA: Diagnosis not present

## 2021-12-26 DIAGNOSIS — D649 Anemia, unspecified: Secondary | ICD-10-CM

## 2021-12-26 DIAGNOSIS — R110 Nausea: Secondary | ICD-10-CM | POA: Diagnosis not present

## 2021-12-26 DIAGNOSIS — Z791 Long term (current) use of non-steroidal anti-inflammatories (NSAID): Secondary | ICD-10-CM | POA: Diagnosis not present

## 2021-12-26 DIAGNOSIS — Z6824 Body mass index (BMI) 24.0-24.9, adult: Secondary | ICD-10-CM | POA: Diagnosis not present

## 2021-12-26 DIAGNOSIS — D62 Acute posthemorrhagic anemia: Secondary | ICD-10-CM | POA: Diagnosis not present

## 2021-12-26 DIAGNOSIS — Z951 Presence of aortocoronary bypass graft: Secondary | ICD-10-CM | POA: Insufficient documentation

## 2021-12-26 DIAGNOSIS — K3189 Other diseases of stomach and duodenum: Secondary | ICD-10-CM | POA: Diagnosis not present

## 2021-12-26 DIAGNOSIS — M62830 Muscle spasm of back: Secondary | ICD-10-CM | POA: Diagnosis not present

## 2021-12-26 DIAGNOSIS — Z4789 Encounter for other orthopedic aftercare: Secondary | ICD-10-CM | POA: Diagnosis not present

## 2021-12-26 DIAGNOSIS — Z981 Arthrodesis status: Secondary | ICD-10-CM | POA: Diagnosis not present

## 2021-12-26 DIAGNOSIS — I82409 Acute embolism and thrombosis of unspecified deep veins of unspecified lower extremity: Secondary | ICD-10-CM | POA: Diagnosis not present

## 2021-12-26 DIAGNOSIS — K5909 Other constipation: Secondary | ICD-10-CM | POA: Diagnosis not present

## 2021-12-26 DIAGNOSIS — Z79891 Long term (current) use of opiate analgesic: Secondary | ICD-10-CM | POA: Diagnosis not present

## 2021-12-26 DIAGNOSIS — R112 Nausea with vomiting, unspecified: Secondary | ICD-10-CM | POA: Diagnosis not present

## 2021-12-26 DIAGNOSIS — R630 Anorexia: Secondary | ICD-10-CM | POA: Diagnosis not present

## 2021-12-26 DIAGNOSIS — M5416 Radiculopathy, lumbar region: Secondary | ICD-10-CM | POA: Diagnosis not present

## 2021-12-26 DIAGNOSIS — R432 Parageusia: Secondary | ICD-10-CM | POA: Diagnosis not present

## 2021-12-26 DIAGNOSIS — Z7409 Other reduced mobility: Secondary | ICD-10-CM | POA: Diagnosis not present

## 2021-12-26 DIAGNOSIS — K5904 Chronic idiopathic constipation: Secondary | ICD-10-CM | POA: Diagnosis not present

## 2021-12-26 DIAGNOSIS — T82855A Stenosis of coronary artery stent, initial encounter: Secondary | ICD-10-CM | POA: Diagnosis not present

## 2021-12-26 DIAGNOSIS — J329 Chronic sinusitis, unspecified: Secondary | ICD-10-CM | POA: Diagnosis not present

## 2021-12-26 DIAGNOSIS — K259 Gastric ulcer, unspecified as acute or chronic, without hemorrhage or perforation: Secondary | ICD-10-CM | POA: Diagnosis not present

## 2021-12-26 DIAGNOSIS — K56609 Unspecified intestinal obstruction, unspecified as to partial versus complete obstruction: Secondary | ICD-10-CM | POA: Diagnosis not present

## 2021-12-26 DIAGNOSIS — Z741 Need for assistance with personal care: Secondary | ICD-10-CM | POA: Diagnosis not present

## 2021-12-26 DIAGNOSIS — G8918 Other acute postprocedural pain: Secondary | ICD-10-CM | POA: Diagnosis not present

## 2021-12-26 DIAGNOSIS — R430 Anosmia: Secondary | ICD-10-CM | POA: Diagnosis not present

## 2021-12-26 DIAGNOSIS — I214 Non-ST elevation (NSTEMI) myocardial infarction: Secondary | ICD-10-CM | POA: Diagnosis not present

## 2021-12-26 DIAGNOSIS — K5903 Drug induced constipation: Secondary | ICD-10-CM | POA: Diagnosis not present

## 2021-12-26 DIAGNOSIS — R252 Cramp and spasm: Secondary | ICD-10-CM | POA: Diagnosis not present

## 2021-12-26 DIAGNOSIS — Z9889 Other specified postprocedural states: Secondary | ICD-10-CM | POA: Diagnosis not present

## 2021-12-26 DIAGNOSIS — N1330 Unspecified hydronephrosis: Secondary | ICD-10-CM | POA: Diagnosis not present

## 2021-12-26 DIAGNOSIS — I2510 Atherosclerotic heart disease of native coronary artery without angina pectoris: Secondary | ICD-10-CM | POA: Diagnosis not present

## 2021-12-26 DIAGNOSIS — Z87891 Personal history of nicotine dependence: Secondary | ICD-10-CM | POA: Diagnosis not present

## 2021-12-26 DIAGNOSIS — U099 Post covid-19 condition, unspecified: Secondary | ICD-10-CM | POA: Diagnosis not present

## 2021-12-26 DIAGNOSIS — Z8616 Personal history of COVID-19: Secondary | ICD-10-CM | POA: Diagnosis not present

## 2021-12-26 DIAGNOSIS — Z466 Encounter for fitting and adjustment of urinary device: Secondary | ICD-10-CM | POA: Diagnosis not present

## 2021-12-26 DIAGNOSIS — Z955 Presence of coronary angioplasty implant and graft: Secondary | ICD-10-CM | POA: Diagnosis not present

## 2021-12-26 HISTORY — DX: Arthrodesis status: Z98.1

## 2021-12-26 HISTORY — DX: Anemia, unspecified: D64.9

## 2021-12-26 HISTORY — DX: Presence of aortocoronary bypass graft: Z95.1

## 2021-12-27 DIAGNOSIS — K56609 Unspecified intestinal obstruction, unspecified as to partial versus complete obstruction: Secondary | ICD-10-CM | POA: Diagnosis not present

## 2021-12-27 DIAGNOSIS — Z466 Encounter for fitting and adjustment of urinary device: Secondary | ICD-10-CM | POA: Diagnosis not present

## 2021-12-27 DIAGNOSIS — Z981 Arthrodesis status: Secondary | ICD-10-CM | POA: Diagnosis not present

## 2021-12-30 DIAGNOSIS — Z981 Arthrodesis status: Secondary | ICD-10-CM | POA: Diagnosis not present

## 2021-12-31 DIAGNOSIS — U099 Post covid-19 condition, unspecified: Secondary | ICD-10-CM | POA: Diagnosis not present

## 2021-12-31 DIAGNOSIS — Z981 Arthrodesis status: Secondary | ICD-10-CM | POA: Diagnosis not present

## 2021-12-31 DIAGNOSIS — R432 Parageusia: Secondary | ICD-10-CM | POA: Diagnosis not present

## 2021-12-31 DIAGNOSIS — R630 Anorexia: Secondary | ICD-10-CM | POA: Diagnosis not present

## 2021-12-31 DIAGNOSIS — K5909 Other constipation: Secondary | ICD-10-CM | POA: Diagnosis not present

## 2021-12-31 DIAGNOSIS — Z9889 Other specified postprocedural states: Secondary | ICD-10-CM | POA: Diagnosis not present

## 2021-12-31 DIAGNOSIS — Z791 Long term (current) use of non-steroidal anti-inflammatories (NSAID): Secondary | ICD-10-CM | POA: Diagnosis not present

## 2021-12-31 DIAGNOSIS — D62 Acute posthemorrhagic anemia: Secondary | ICD-10-CM | POA: Diagnosis not present

## 2022-01-01 DIAGNOSIS — R110 Nausea: Secondary | ICD-10-CM | POA: Diagnosis not present

## 2022-01-01 DIAGNOSIS — I2510 Atherosclerotic heart disease of native coronary artery without angina pectoris: Secondary | ICD-10-CM | POA: Diagnosis not present

## 2022-01-01 DIAGNOSIS — D62 Acute posthemorrhagic anemia: Secondary | ICD-10-CM | POA: Diagnosis not present

## 2022-01-01 DIAGNOSIS — G8918 Other acute postprocedural pain: Secondary | ICD-10-CM | POA: Diagnosis not present

## 2022-01-01 DIAGNOSIS — R112 Nausea with vomiting, unspecified: Secondary | ICD-10-CM | POA: Diagnosis not present

## 2022-01-01 DIAGNOSIS — T82855A Stenosis of coronary artery stent, initial encounter: Secondary | ICD-10-CM | POA: Diagnosis not present

## 2022-01-01 DIAGNOSIS — N1330 Unspecified hydronephrosis: Secondary | ICD-10-CM | POA: Diagnosis not present

## 2022-01-01 DIAGNOSIS — I82409 Acute embolism and thrombosis of unspecified deep veins of unspecified lower extremity: Secondary | ICD-10-CM | POA: Diagnosis not present

## 2022-01-01 DIAGNOSIS — Z955 Presence of coronary angioplasty implant and graft: Secondary | ICD-10-CM | POA: Diagnosis not present

## 2022-01-01 DIAGNOSIS — Z981 Arthrodesis status: Secondary | ICD-10-CM | POA: Diagnosis not present

## 2022-01-01 DIAGNOSIS — K5904 Chronic idiopathic constipation: Secondary | ICD-10-CM | POA: Diagnosis not present

## 2022-01-01 DIAGNOSIS — Z7409 Other reduced mobility: Secondary | ICD-10-CM | POA: Diagnosis not present

## 2022-01-01 DIAGNOSIS — K3189 Other diseases of stomach and duodenum: Secondary | ICD-10-CM | POA: Diagnosis not present

## 2022-01-01 DIAGNOSIS — Z79891 Long term (current) use of opiate analgesic: Secondary | ICD-10-CM | POA: Diagnosis not present

## 2022-01-01 DIAGNOSIS — R630 Anorexia: Secondary | ICD-10-CM | POA: Diagnosis not present

## 2022-01-01 DIAGNOSIS — I214 Non-ST elevation (NSTEMI) myocardial infarction: Secondary | ICD-10-CM | POA: Diagnosis not present

## 2022-01-02 DIAGNOSIS — Z981 Arthrodesis status: Secondary | ICD-10-CM | POA: Diagnosis not present

## 2022-01-03 DIAGNOSIS — Z981 Arthrodesis status: Secondary | ICD-10-CM | POA: Diagnosis not present

## 2022-01-06 DIAGNOSIS — Z981 Arthrodesis status: Secondary | ICD-10-CM | POA: Diagnosis not present

## 2022-01-08 DIAGNOSIS — K5903 Drug induced constipation: Secondary | ICD-10-CM

## 2022-01-08 DIAGNOSIS — Z9889 Other specified postprocedural states: Secondary | ICD-10-CM

## 2022-01-08 DIAGNOSIS — M62830 Muscle spasm of back: Secondary | ICD-10-CM | POA: Insufficient documentation

## 2022-01-08 DIAGNOSIS — Z981 Arthrodesis status: Secondary | ICD-10-CM | POA: Diagnosis not present

## 2022-01-08 HISTORY — DX: Drug induced constipation: K59.03

## 2022-01-08 HISTORY — DX: Other specified postprocedural states: Z98.890

## 2022-01-21 DIAGNOSIS — M4326 Fusion of spine, lumbar region: Secondary | ICD-10-CM | POA: Diagnosis not present

## 2022-01-22 DIAGNOSIS — N390 Urinary tract infection, site not specified: Secondary | ICD-10-CM | POA: Diagnosis not present

## 2022-01-22 DIAGNOSIS — D62 Acute posthemorrhagic anemia: Secondary | ICD-10-CM | POA: Diagnosis not present

## 2022-01-22 DIAGNOSIS — R5383 Other fatigue: Secondary | ICD-10-CM | POA: Diagnosis not present

## 2022-01-27 DIAGNOSIS — N131 Hydronephrosis with ureteral stricture, not elsewhere classified: Secondary | ICD-10-CM | POA: Diagnosis not present

## 2022-01-31 DIAGNOSIS — D649 Anemia, unspecified: Secondary | ICD-10-CM | POA: Diagnosis not present

## 2022-02-20 ENCOUNTER — Other Ambulatory Visit: Payer: Self-pay

## 2022-02-20 DIAGNOSIS — R413 Other amnesia: Secondary | ICD-10-CM

## 2022-02-27 DIAGNOSIS — I2510 Atherosclerotic heart disease of native coronary artery without angina pectoris: Secondary | ICD-10-CM | POA: Diagnosis not present

## 2022-02-27 DIAGNOSIS — E042 Nontoxic multinodular goiter: Secondary | ICD-10-CM | POA: Diagnosis not present

## 2022-02-27 DIAGNOSIS — G894 Chronic pain syndrome: Secondary | ICD-10-CM | POA: Diagnosis not present

## 2022-02-27 DIAGNOSIS — N951 Menopausal and female climacteric states: Secondary | ICD-10-CM | POA: Diagnosis not present

## 2022-02-27 DIAGNOSIS — R7303 Prediabetes: Secondary | ICD-10-CM | POA: Diagnosis not present

## 2022-02-27 DIAGNOSIS — K219 Gastro-esophageal reflux disease without esophagitis: Secondary | ICD-10-CM | POA: Diagnosis not present

## 2022-02-27 DIAGNOSIS — E782 Mixed hyperlipidemia: Secondary | ICD-10-CM | POA: Diagnosis not present

## 2022-02-27 DIAGNOSIS — G909 Disorder of the autonomic nervous system, unspecified: Secondary | ICD-10-CM | POA: Diagnosis not present

## 2022-02-28 DIAGNOSIS — D649 Anemia, unspecified: Secondary | ICD-10-CM | POA: Diagnosis not present

## 2022-02-28 DIAGNOSIS — R110 Nausea: Secondary | ICD-10-CM | POA: Diagnosis not present

## 2022-02-28 DIAGNOSIS — K5909 Other constipation: Secondary | ICD-10-CM | POA: Diagnosis not present

## 2022-02-28 DIAGNOSIS — K219 Gastro-esophageal reflux disease without esophagitis: Secondary | ICD-10-CM | POA: Diagnosis not present

## 2022-03-03 ENCOUNTER — Other Ambulatory Visit: Payer: Self-pay | Admitting: Family Medicine

## 2022-03-03 DIAGNOSIS — Z1231 Encounter for screening mammogram for malignant neoplasm of breast: Secondary | ICD-10-CM

## 2022-03-05 ENCOUNTER — Ambulatory Visit: Payer: Medicare HMO

## 2022-03-05 DIAGNOSIS — I700 Atherosclerosis of aorta: Secondary | ICD-10-CM | POA: Diagnosis not present

## 2022-03-05 DIAGNOSIS — N134 Hydroureter: Secondary | ICD-10-CM | POA: Diagnosis not present

## 2022-03-05 DIAGNOSIS — N1330 Unspecified hydronephrosis: Secondary | ICD-10-CM | POA: Diagnosis not present

## 2022-03-05 DIAGNOSIS — N131 Hydronephrosis with ureteral stricture, not elsewhere classified: Secondary | ICD-10-CM | POA: Diagnosis not present

## 2022-03-10 DIAGNOSIS — N131 Hydronephrosis with ureteral stricture, not elsewhere classified: Secondary | ICD-10-CM | POA: Diagnosis not present

## 2022-03-19 DIAGNOSIS — M461 Sacroiliitis, not elsewhere classified: Secondary | ICD-10-CM | POA: Diagnosis not present

## 2022-03-19 DIAGNOSIS — M4325 Fusion of spine, thoracolumbar region: Secondary | ICD-10-CM | POA: Diagnosis not present

## 2022-03-19 DIAGNOSIS — M4326 Fusion of spine, lumbar region: Secondary | ICD-10-CM | POA: Diagnosis not present

## 2022-03-19 DIAGNOSIS — M5116 Intervertebral disc disorders with radiculopathy, lumbar region: Secondary | ICD-10-CM | POA: Diagnosis not present

## 2022-03-19 DIAGNOSIS — Z6824 Body mass index (BMI) 24.0-24.9, adult: Secondary | ICD-10-CM | POA: Diagnosis not present

## 2022-03-28 DIAGNOSIS — Z6824 Body mass index (BMI) 24.0-24.9, adult: Secondary | ICD-10-CM | POA: Diagnosis not present

## 2022-03-28 DIAGNOSIS — M461 Sacroiliitis, not elsewhere classified: Secondary | ICD-10-CM | POA: Diagnosis not present

## 2022-03-31 DIAGNOSIS — H43811 Vitreous degeneration, right eye: Secondary | ICD-10-CM | POA: Diagnosis not present

## 2022-04-02 ENCOUNTER — Encounter: Payer: Medicare HMO | Admitting: Psychology

## 2022-04-16 ENCOUNTER — Encounter: Payer: Medicare HMO | Admitting: Psychology

## 2022-04-28 ENCOUNTER — Other Ambulatory Visit: Payer: Medicare HMO

## 2022-04-28 ENCOUNTER — Ambulatory Visit: Payer: Medicare HMO | Admitting: Physician Assistant

## 2022-04-28 ENCOUNTER — Encounter: Payer: Self-pay | Admitting: Cardiology

## 2022-04-28 ENCOUNTER — Ambulatory Visit: Payer: Medicare HMO | Admitting: Cardiology

## 2022-04-28 VITALS — BP 118/60 | HR 67 | Ht 62.0 in | Wt 126.0 lb

## 2022-04-28 DIAGNOSIS — R002 Palpitations: Secondary | ICD-10-CM | POA: Insufficient documentation

## 2022-04-28 DIAGNOSIS — I25119 Atherosclerotic heart disease of native coronary artery with unspecified angina pectoris: Secondary | ICD-10-CM | POA: Diagnosis not present

## 2022-04-28 DIAGNOSIS — H43811 Vitreous degeneration, right eye: Secondary | ICD-10-CM | POA: Diagnosis not present

## 2022-04-28 HISTORY — DX: Palpitations: R00.2

## 2022-04-28 MED ORDER — BISOPROLOL FUMARATE 10 MG PO TABS: 10.0000 mg | ORAL_TABLET | Freq: Every day | ORAL | 2 refills | Status: DC | PRN

## 2022-04-28 MED ORDER — ROSUVASTATIN CALCIUM 10 MG PO TABS: 10.0000 mg | ORAL_TABLET | Freq: Every day | ORAL | 3 refills | Status: DC

## 2022-04-28 MED ORDER — FLUDROCORTISONE ACETATE 0.1 MG PO TABS: 0.1000 mg | ORAL_TABLET | ORAL | 22 refills | Status: AC | PRN

## 2022-04-28 MED ORDER — ASPIRIN 81 MG PO TBEC: 81.0000 mg | DELAYED_RELEASE_TABLET | Freq: Every day | ORAL | 3 refills | Status: AC

## 2022-04-28 NOTE — Progress Notes (Signed)
Patient referred by [name], MD for CAD  Subjective:   Briana Bowers, female    DOB: April 15, 1950, 72 y.o.   MRN: [id number]   Chief Complaint  Patient presents with   Coronary Artery Disease   New Patient (Initial Visit)     HPI  72 y.o. Caucasian female with hypertension, hyperlipidemia, CAD (PCI to [place], LAD 2014), h/ autonomic dysfunction  Patient was last seen by [facility] heart care in 2017.  Most recently, patient was seeing [facility] cardiology at [facility]. She would now like to establish care with [place] as she lives in [place]. Patient has undergone major spinal reconstruction surgery within the past year. She is slowly increasing her physical activity as tolerated. She denies chest pain, shortness of breath, leg edema, orthopnea, PND, TIA/syncope. She reportedly has had palpitations for many years. For which she has taken "as needed bisoprolol 10 mg" , as well as florinef for as needed lightheadedness episodes.     Past Medical History:  Diagnosis Date   Anxiety disorder 12/27/2012   resolved per pt as of 12/02/21   Arthritis    Degenerative joint disease   Autonomic dysfunction    pt has episodes of tachycardia and hypotension   CAD (coronary artery disease), native coronary artery 12/27/2012   a. NSTEMI/RCA stent 1999. b. LAD stent 2014. c. Neg nuc 11/2014.   Chronic fatigue 12/27/2012   Chronic lower back pain    COVID-19 07/17/2020   Gastroesophageal reflux disease 12/27/2012   H/O hiatal hernia    History of blood transfusion    w/tonsillectomy; w/spinal fusion (2004)   Hyperlipidemia    Hypotension    a. prompting holding of BB.   Myocardial infarction ([place]) 08/02/1997   Pre-diabetes    Sinus arrest 12/17/2017   3.2 second sinus pause, aslo PVCs and PACs, asymptomatic, now has loop recorder   Wears glasses      Past Surgical History:  Procedure Laterality Date   APPENDECTOMY  1965   BACK SURGERY     BILATERAL SALPINGOOPHORECTOMY Bilateral 1978    "2 separate ORs; months apart" (12/27/2012)   CARDIAC CATHETERIZATION  2000's   "couple times" (12/27/2012)   CHOLECYSTECTOMY  08/1989   CORONARY ANGIOPLASTY WITH STENT PLACEMENT  08/03/1997; 12/28/2012   1 + 1" (01/19/2013)   CYSTOSCOPY W/ URETERAL STENT PLACEMENT Left 12/06/2021   Procedure: CYSTOSCOPY WITH RETROGRADE PYELOGRAM/URETERAL STENT PLACEMENT;  Surgeon: Ceasar Mons, MD;  Location: [facility];  Service: Urology;  Laterality: Left;   DILATION AND CURETTAGE OF UTERUS  09/1967   "after miscarriage" (12/27/2012)   FINGER ARTHROPLASTY Right 12/232011   "ring finger; fused it; put pin in it; 2nd joint" (12/27/2012)   FRACTIONAL FLOW RESERVE WIRE  12/28/2012   Procedure: [place];  Surgeon: Sinclair Grooms, MD;  Location: [facility] CATH LAB;  Service: Cardiovascular;;  PROX LAD   GANGLION CYST EXCISION Right 1990's   GUM SURGERY  2011   "tissue transplant; 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" (12/27/2012)   HERNIA REPAIR  02/2002   "ventral" (12/27/2012)   LEFT HEART CATHETERIZATION WITH CORONARY ANGIOGRAM N/A 12/28/2012   Procedure: LEFT HEART CATHETERIZATION WITH CORONARY ANGIOGRAM;  Surgeon: Sinclair Grooms, MD;  Location: [facility] CATH LAB;  Service: Cardiovascular;  Laterality: N/A;   LEFT HEART CATHETERIZATION WITH CORONARY ANGIOGRAM N/A 01/20/2013   Procedure: LEFT HEART CATHETERIZATION WITH CORONARY ANGIOGRAM;  Surgeon: Blane Ohara, MD;  Location: [facility] CATH LAB;  Service: Cardiovascular;  Laterality: N/A;   LIGAMENT REPAIR Right 1990's   "radial collateral" (12/27/2012)   [place]; 1990's   "twice" (12/27/2012)   MASS EXCISION Left 1990's   "groin" (12/27/2012)   [place]   PARTIAL HYSTERECTOMY  1974   PERCUTANEOUS CORONARY STENT INTERVENTION (PCI-S)  12/28/2012   Procedure: PERCUTANEOUS CORONARY STENT INTERVENTION (PCI-S);  Surgeon: Sinclair Grooms, MD;  Location: [facility] CATH LAB;  Service:  Cardiovascular;;  Prox LAD   POSTERIOR LUMBAR FUSION  04/2002   TONSILLECTOMY  1964   TYMPANOPLASTY Bilateral 1966; 2001   "left; right" (12/27/2012)     Social History   Tobacco Use  Smoking Status Former   Packs/day: 1.00   Years: 17.00   Total pack years: 17.00   Types: Cigarettes   Quit date: 12/27/1981   Years since quitting: 40.3  Smokeless Tobacco Never    Social History   Substance and Sexual Activity  Alcohol Use No   Alcohol/week: 0.0 standard drinks of alcohol     Family History  Problem Relation Age of Onset   Coronary artery disease Mother    Coronary artery disease Father    Other Daughter        TBI   Other Son        Suicide      Current Outpatient Medications:    acidophilus (RISAQUAD) CAPS capsule, Take 1 capsule by mouth daily., Disp: , Rfl:    albuterol (VENTOLIN HFA) 108 (90 Base) MCG/ACT inhaler, Inhale 2 puffs into the lungs every 6 (six) hours as needed for wheezing or shortness of breath. Uses when sick. She doesn't have to use often per pt on 12/02/21., Disp: , Rfl:    aspirin EC 81 MG tablet, Take 81 mg by mouth at bedtime. Swallow whole., Disp: , Rfl:    b complex vitamins capsule, Take 1 capsule by mouth daily., Disp: , Rfl:    bisoprolol (ZEBETA) 10 MG tablet, Take 10 mg by mouth daily as needed (if heart rate 100 or greater)., Disp: , Rfl:    budesonide-formoterol (SYMBICORT) 160-4.5 MCG/ACT inhaler, Inhale 2 puffs into the lungs 2 (two) times daily as needed (sob/wheezing)., Disp: , Rfl:    cetirizine (ZYRTEC) 10 MG tablet, Take 10 mg by mouth as needed., Disp: , Rfl:    Cholecalciferol (VITAMIN D3 PO), Take 1 tablet by mouth daily., Disp: , Rfl:    Coenzyme Q10 (CO Q 10 PO), Take 1 tablet by mouth daily., Disp: , Rfl:    estrogen-methylTESTOSTERone (ESTRATEST) 1.25-2.5 MG per tablet, Take 1 tablet by mouth at bedtime., Disp: , Rfl:    fish oil-omega-3 fatty acids 1000 MG capsule, Take 1 g by mouth daily., Disp: , Rfl:     fludrocortisone (FLORINEF) 0.1 MG tablet, Take 0.1 mg by mouth as needed., Disp: , Rfl:    fluticasone (FLONASE) 50 MCG/ACT nasal spray, Place 2 sprays into both nostrils as needed., Disp: , Rfl:    Glucosamine 500 MG CAPS, Take by mouth. Pt unsure of dosage., Disp: , Rfl:    HYDROcodone-acetaminophen (NORCO) 10-325 MG tablet, Take 1 tablet by mouth every 6 (six) hours as needed for moderate pain or severe pain. Takes 4 times a day on a daily basis., Disp: , Rfl:    meloxicam (MOBIC) 15 MG tablet, Take 15 mg by mouth daily as needed for pain., Disp: , Rfl:    methocarbamol (ROBAXIN) 500 MG tablet,  Take 250 mg by mouth 3 (three) times daily., Disp: , Rfl:    nortriptyline (PAMELOR) 50 MG capsule, Take 50 mg by mouth at bedtime., Disp: , Rfl:    oxybutynin (DITROPAN) 5 MG tablet, Take 1 tablet (5 mg total) by mouth every 8 (eight) hours as needed for bladder spasms., Disp: 30 tablet, Rfl: 1   pantoprazole (PROTONIX) 40 MG tablet, Take 40 mg by mouth daily., Disp: , Rfl:    phenazopyridine (PYRIDIUM) 200 MG tablet, Take 1 tablet (200 mg total) by mouth 3 (three) times daily as needed (for pain with urination)., Disp: 30 tablet, Rfl: 0   rosuvastatin (CRESTOR) 10 MG tablet, Take 10 mg by mouth at bedtime., Disp: , Rfl:    Cardiovascular and other pertinent studies:  Reviewed external labs and tests, independently interpreted  EKG 04/28/2022: Sinus rhythm 66 bpm  Nonspecific T wave abnormality  SPECT MPI 08/2021:  1. Normal ECG stress test.    2. Myocardial perfusion imaging is normal.    3. Overall left ventricular systolic function was normal without regional  wall motion-abnormalities. Ejection fraction 72 %.   Echocardiogram 2020:   1. Normal left ventricular systolic function, ejection fraction 65%.    2. Diastolic dysfunction - grade I (normal filling pressures).    3. No significant valvular abnormalities.    4. No evidence to suggest cardiac tamponade.     Recent  labs: 01/06/2022: Glucose 98, BUN/Cr 9/0.61. EGFR >90. Na/K 134/4.5.  H/H 9/28. MCV 100. Platelets 672  12/06/2021: Glucose 88, BUN/Cr 13/0.60. Na/K 136/3.9.  H/H 13/40.   Review of Systems  Cardiovascular:  Positive for palpitations. Negative for chest pain, dyspnea on exertion, leg swelling and syncope.  Neurological:  Positive for light-headedness.        Vitals:   04/28/22 1323  BP: 118/60  Pulse: 67  SpO2: 97%     Body mass index is 23.05 kg/m. Filed Weights   04/28/22 1323  Weight: 126 lb (57.2 kg)     Objective:   Physical Exam Vitals and nursing note reviewed.  Constitutional:      General: She is not in acute distress. Neck:     Vascular: No JVD.  Cardiovascular:     Rate and Rhythm: Normal rate and regular rhythm.     Heart sounds: Normal heart sounds. No murmur heard. Pulmonary:     Effort: Pulmonary effort is normal.     Breath sounds: Normal breath sounds. No wheezing or rales.  Musculoskeletal:     Right lower leg: No edema.     Left lower leg: No edema.         Visit diagnoses:   ICD-10-CM   1. Coronary artery disease involving native coronary artery of native heart with angina pectoris ([place])  I25.119 EKG 12-Lead    2. Palpitations  R00.2 LONG TERM MONITOR (3-14 DAYS)       Orders Placed This Encounter  Procedures   LONG TERM MONITOR (3-14 DAYS)   EKG 12-Lead     Medication changes this visit: There are no discontinued medications.  Meds ordered this encounter  Medications   rosuvastatin (CRESTOR) 10 MG tablet    Sig: Take 1 tablet (10 mg total) by mouth at bedtime.    Dispense:  90 tablet    Refill:  3   bisoprolol (ZEBETA) 10 MG tablet    Sig: Take 1 tablet (10 mg total) by mouth daily as needed (if heart rate 100 or greater). Patient reportedly takes  it for palpitations for many years    Dispense:  60 tablet    Refill:  2   aspirin EC 81 MG tablet    Sig: Take 1 tablet (81 mg total) by mouth daily. Swallow whole.     Dispense:  90 tablet    Refill:  3   fludrocortisone (FLORINEF) 0.1 MG tablet    Sig: Take 1 tablet (0.1 mg total) by mouth as needed. For episodes of lightheadedness    Dispense:  60 tablet    Refill:  22     Assessment & Recommendations:   72 y.o. Caucasian female with hypertension, hyperlipidemia, CAD (PCI to [place], LAD 2014), h/o autonomic dysfunction  CAD: No angina symptoms No ischemia on nuclear stress test (2023) Continue Aspirin, statin. Refilled today. She has upcoming labs with PCP next month, where lipid panel can be checked.   Palpitations: Reportedly does well with as needed bisoprolol. Check 2 week cardiac telemetry  H/o autonomic dysfunction: Historically, she has done well with prn florinef. Refilled the same.    Thank you for referring the patient to [place]. Please feel free to contact with any questions.   Nigel Mormon, MD Pager: [phone] Office: [phone]

## 2022-05-05 DIAGNOSIS — M5416 Radiculopathy, lumbar region: Secondary | ICD-10-CM | POA: Diagnosis not present

## 2022-05-05 DIAGNOSIS — M461 Sacroiliitis, not elsewhere classified: Secondary | ICD-10-CM | POA: Diagnosis not present

## 2022-05-06 ENCOUNTER — Ambulatory Visit: Payer: Medicare HMO

## 2022-05-19 DIAGNOSIS — R002 Palpitations: Secondary | ICD-10-CM | POA: Diagnosis not present

## 2022-05-23 DIAGNOSIS — R292 Abnormal reflex: Secondary | ICD-10-CM | POA: Diagnosis not present

## 2022-05-23 DIAGNOSIS — Z9989 Dependence on other enabling machines and devices: Secondary | ICD-10-CM | POA: Diagnosis not present

## 2022-05-23 DIAGNOSIS — R4189 Other symptoms and signs involving cognitive functions and awareness: Secondary | ICD-10-CM | POA: Diagnosis not present

## 2022-05-23 DIAGNOSIS — R202 Paresthesia of skin: Secondary | ICD-10-CM | POA: Diagnosis not present

## 2022-05-31 DIAGNOSIS — R002 Palpitations: Secondary | ICD-10-CM | POA: Diagnosis not present

## 2022-06-02 DIAGNOSIS — G5732 Lesion of lateral popliteal nerve, left lower limb: Secondary | ICD-10-CM

## 2022-06-02 DIAGNOSIS — M79604 Pain in right leg: Secondary | ICD-10-CM | POA: Diagnosis not present

## 2022-06-02 DIAGNOSIS — M79605 Pain in left leg: Secondary | ICD-10-CM | POA: Diagnosis not present

## 2022-06-02 DIAGNOSIS — M545 Low back pain, unspecified: Secondary | ICD-10-CM | POA: Diagnosis not present

## 2022-06-02 HISTORY — DX: Lesion of lateral popliteal nerve, left lower limb: G57.32

## 2022-06-09 ENCOUNTER — Other Ambulatory Visit: Payer: Self-pay | Admitting: Physician Assistant

## 2022-06-09 DIAGNOSIS — R292 Abnormal reflex: Secondary | ICD-10-CM

## 2022-06-09 DIAGNOSIS — R202 Paresthesia of skin: Secondary | ICD-10-CM

## 2022-06-10 ENCOUNTER — Other Ambulatory Visit: Payer: Self-pay | Admitting: Physician Assistant

## 2022-06-10 DIAGNOSIS — R292 Abnormal reflex: Secondary | ICD-10-CM

## 2022-06-10 DIAGNOSIS — R202 Paresthesia of skin: Secondary | ICD-10-CM

## 2022-06-20 ENCOUNTER — Ambulatory Visit: Payer: Medicare HMO

## 2022-06-23 ENCOUNTER — Ambulatory Visit
Admission: RE | Admit: 2022-06-23 | Discharge: 2022-06-23 | Disposition: A | Discharge status: Home / Self Care | Payer: Medicare HMO | Source: Ambulatory Visit | Attending: Physician Assistant | Admitting: Physician Assistant

## 2022-06-23 DIAGNOSIS — M4312 Spondylolisthesis, cervical region: Secondary | ICD-10-CM | POA: Diagnosis not present

## 2022-06-23 DIAGNOSIS — I6782 Cerebral ischemia: Secondary | ICD-10-CM | POA: Diagnosis not present

## 2022-06-23 DIAGNOSIS — R202 Paresthesia of skin: Secondary | ICD-10-CM

## 2022-06-23 DIAGNOSIS — R292 Abnormal reflex: Secondary | ICD-10-CM

## 2022-06-23 DIAGNOSIS — M4802 Spinal stenosis, cervical region: Secondary | ICD-10-CM | POA: Diagnosis not present

## 2022-06-23 MED ORDER — GADOPICLENOL 0.5 MMOL/ML IV SOLN
6.0000 mL | Freq: Once | INTRAVENOUS | Status: AC | PRN
  Administered 2022-06-23: 6 mL via INTRAVENOUS

## 2022-07-01 DIAGNOSIS — H6061 Unspecified chronic otitis externa, right ear: Secondary | ICD-10-CM | POA: Diagnosis not present

## 2022-07-01 DIAGNOSIS — J320 Chronic maxillary sinusitis: Secondary | ICD-10-CM | POA: Diagnosis not present

## 2022-07-01 DIAGNOSIS — H6121 Impacted cerumen, right ear: Secondary | ICD-10-CM | POA: Diagnosis not present

## 2022-07-03 DIAGNOSIS — M47812 Spondylosis without myelopathy or radiculopathy, cervical region: Secondary | ICD-10-CM | POA: Diagnosis not present

## 2022-07-07 DIAGNOSIS — S8412XA Injury of peroneal nerve at lower leg level, left leg, initial encounter: Secondary | ICD-10-CM | POA: Diagnosis not present

## 2022-07-07 DIAGNOSIS — M961 Postlaminectomy syndrome, not elsewhere classified: Secondary | ICD-10-CM | POA: Diagnosis not present

## 2022-07-14 ENCOUNTER — Telehealth: Payer: Self-pay | Admitting: *Deleted

## 2022-07-14 NOTE — Patient Outreach (Signed)
  Care Coordination   Initial Visit Note   07/14/2022 Name: KLOVER MORSS MRN: [id number] DOB: 11/02/1950  BITTANY PIERSMA is a 72 y.o. year old female who sees [name], MD for primary care. I spoke with  Lenis Dickinson by phone today.  What matters to the patients health and wellness today?  Advised pt of the Care Coordination program/team support. Pt denies need for RNCM and CSW at this time. States she has had an appointment with Pharmacy team at PCP office which has been cancelled/rescheduled.     Goals Addressed   None     SDOH assessments and interventions completed:  No     Care Coordination Interventions:  No, not indicated   Follow up plan: No further intervention required.   Encounter Outcome:  Pt. Refused

## 2022-07-15 DIAGNOSIS — K219 Gastro-esophageal reflux disease without esophagitis: Secondary | ICD-10-CM | POA: Diagnosis not present

## 2022-07-15 DIAGNOSIS — J4520 Mild intermittent asthma, uncomplicated: Secondary | ICD-10-CM | POA: Diagnosis not present

## 2022-07-15 DIAGNOSIS — E782 Mixed hyperlipidemia: Secondary | ICD-10-CM | POA: Diagnosis not present

## 2022-07-22 ENCOUNTER — Ambulatory Visit: Payer: Medicare HMO | Admitting: Cardiology

## 2022-07-22 ENCOUNTER — Encounter: Payer: Self-pay | Admitting: Cardiology

## 2022-07-22 VITALS — BP 149/72 | HR 98 | Ht 62.0 in | Wt 129.0 lb

## 2022-07-22 DIAGNOSIS — I491 Atrial premature depolarization: Secondary | ICD-10-CM | POA: Insufficient documentation

## 2022-07-22 DIAGNOSIS — R002 Palpitations: Secondary | ICD-10-CM

## 2022-07-22 DIAGNOSIS — I25119 Atherosclerotic heart disease of native coronary artery with unspecified angina pectoris: Secondary | ICD-10-CM | POA: Diagnosis not present

## 2022-07-22 HISTORY — DX: Atrial premature depolarization: I49.1

## 2022-07-22 NOTE — Progress Notes (Signed)
Patient referred by Johny Blamer, MD for CAD  Subjective:   Briana Bowers, female    DOB: Feb 27, 1951, 72 y.o.   MRN: [id number]   Chief Complaint  Patient presents with   Coronary Artery Disease   Follow-up   Results     HPI  72 y.o. Caucasian female with hypertension, hyperlipidemia, CAD (PCI to RCA 1999, LAD 2014), h/ autonomic dysfunction  Reviewed recent test results with the patient, details below. Patient has no new cardiac complaints.   Initial consultation visit 04/2022: Patient was last seen by [facility] heart care in 2017.  Most recently, patient was seeing [facility] cardiology at [facility]. She would now like to establish care with [place] as she lives in [place]. Patient has undergone major spinal reconstruction surgery within the past year. She is slowly increasing her physical activity as tolerated. She denies chest pain, shortness of breath, leg edema, orthopnea, PND, TIA/syncope. She reportedly has had palpitations for many years. For which she has taken "as needed bisoprolol 10 mg" , as well as florinef for as needed lightheadedness episodes.     Current Outpatient Medications:    acidophilus (RISAQUAD) CAPS capsule, Take 1 capsule by mouth daily., Disp: , Rfl:    albuterol (VENTOLIN HFA) 108 (90 Base) MCG/ACT inhaler, Inhale 2 puffs into the lungs every 6 (six) hours as needed for wheezing or shortness of breath. Uses when sick. She doesn't have to use often per pt on 12/02/21., Disp: , Rfl:    aspirin EC 81 MG tablet, Take 1 tablet (81 mg total) by mouth daily. Swallow whole., Disp: 90 tablet, Rfl: 3   b complex vitamins capsule, Take 1 capsule by mouth daily., Disp: , Rfl:    bisoprolol (ZEBETA) 10 MG tablet, Take 1 tablet (10 mg total) by mouth daily as needed (if heart rate 100 or greater). Patient reportedly takes it for palpitations for many years, Disp: 60 tablet, Rfl: 2   budesonide-formoterol (SYMBICORT) 160-4.5 MCG/ACT inhaler, Inhale 2 puffs into the lungs 2 (two)  times daily as needed (sob/wheezing)., Disp: , Rfl:    cetirizine (ZYRTEC) 10 MG tablet, Take 10 mg by mouth as needed., Disp: , Rfl:    Cholecalciferol (VITAMIN D3 PO), Take 1 tablet by mouth daily., Disp: , Rfl:    Coenzyme Q10 (CO Q 10 PO), Take 1 tablet by mouth daily., Disp: , Rfl:    estrogen-methylTESTOSTERone (ESTRATEST) 1.25-2.5 MG per tablet, Take 1 tablet by mouth at bedtime., Disp: , Rfl:    fish oil-omega-3 fatty acids 1000 MG capsule, Take 1 g by mouth daily., Disp: , Rfl:    fludrocortisone (FLORINEF) 0.1 MG tablet, Take 1 tablet (0.1 mg total) by mouth as needed. For episodes of lightheadedness, Disp: 60 tablet, Rfl: 22   fluticasone (FLONASE) 50 MCG/ACT nasal spray, Place 2 sprays into both nostrils as needed., Disp: , Rfl:    Glucosamine 500 MG CAPS, Take by mouth. Pt unsure of dosage., Disp: , Rfl:    HYDROcodone-acetaminophen (NORCO) 10-325 MG tablet, Take 1 tablet by mouth every 6 (six) hours as needed for moderate pain or severe pain. Takes 4 times a day on a daily basis., Disp: , Rfl:    meloxicam (MOBIC) 15 MG tablet, Take 15 mg by mouth daily as needed for pain., Disp: , Rfl:    methocarbamol (ROBAXIN) 500 MG tablet, Take 250 mg by mouth 3 (three) times daily., Disp: , Rfl:    nortriptyline (PAMELOR) 50 MG capsule, Take 50 mg  by mouth at bedtime., Disp: , Rfl:    pantoprazole (PROTONIX) 40 MG tablet, Take 40 mg by mouth as needed., Disp: , Rfl:    rosuvastatin (CRESTOR) 10 MG tablet, Take 1 tablet (10 mg total) by mouth at bedtime., Disp: 90 tablet, Rfl: 3   Cardiovascular and other pertinent studies:  Reviewed external labs and tests, independently interpreted  EKG 04/28/2022: Sinus rhythm 66 bpm  Nonspecific T wave abnormality  Mobile cardiac telemetry 13 days 04/28/2022 - 05/12/2022: Dominant rhythm: Sinus. HR 51-116 bpm. Avg HR 76 bpm. 0 episodes of SV. 5% isolated SVE, <1% couplet/triplets. 0 episodes of VT. <1% isolated VE, couplets. No atrial  fibrillation/atrial flutter/SVT/VT/high grade AV block, sinus pause >3sec noted. 10 patient triggered events, correlated with SVE/VE.  SPECT MPI 08/2021:  1. Normal ECG stress test.    2. Myocardial perfusion imaging is normal.    3. Overall left ventricular systolic function was normal without regional  wall motion-abnormalities. Ejection fraction 72 %.   Echocardiogram 2020:   1. Normal left ventricular systolic function, ejection fraction 65%.    2. Diastolic dysfunction - grade I (normal filling pressures).    3. No significant valvular abnormalities.    4. No evidence to suggest cardiac tamponade.     Recent labs: 01/06/2022: Glucose 98, BUN/Cr 9/0.61. EGFR >90. Na/K 134/4.5.  H/H 9/28. MCV 100. Platelets 672  12/06/2021: Glucose 88, BUN/Cr 13/0.60. Na/K 136/3.9.  H/H 13/40.   Review of Systems  Cardiovascular:  Positive for palpitations. Negative for chest pain, dyspnea on exertion, leg swelling and syncope.  Neurological:  Positive for light-headedness.        Vitals:   07/22/22 1243  BP: (!) 149/72  Pulse: 98  SpO2: 95%     Body mass index is 23.59 kg/m. Filed Weights   07/22/22 1243  Weight: 129 lb (58.5 kg)     Objective:   Physical Exam Vitals and nursing note reviewed.  Constitutional:      General: She is not in acute distress. Neck:     Vascular: No JVD.  Cardiovascular:     Rate and Rhythm: Normal rate and regular rhythm.     Heart sounds: Normal heart sounds. No murmur heard. Pulmonary:     Effort: Pulmonary effort is normal.     Breath sounds: Normal breath sounds. No wheezing or rales.  Musculoskeletal:     Right lower leg: No edema.     Left lower leg: No edema.        Visit diagnoses:   ICD-10-CM   1. Palpitations  R00.2     2. Coronary artery disease involving native coronary artery of native heart with angina pectoris  I25.119     3. Premature atrial contraction  I49.1         Assessment & Recommendations:   72 y.o.  Caucasian female with hypertension, hyperlipidemia, CAD (PCI to RCA 1999, LAD 2014), h/o autonomic dysfunction  CAD: No angina symptoms No ischemia on nuclear stress test (2023) Continue Aspirin, statin. Refilled today. She has upcoming labs with PCP next month, where lipid panel can be checked.   Palpitations: SVE (5% burden), VE (<1% burden). Minimally symptomatic. Reportedly does well with as needed bisoprolol.  H/o autonomic dysfunction: Historically, she has done well with prn florinef. Continue the same.   F/u in 1 year    Elder Negus, MD Pager: [phone] Office: [phone]

## 2022-07-28 ENCOUNTER — Encounter (HOSPITAL_COMMUNITY): Payer: Self-pay | Admitting: Urology

## 2022-07-28 DIAGNOSIS — N135 Crossing vessel and stricture of ureter without hydronephrosis: Secondary | ICD-10-CM

## 2022-07-29 ENCOUNTER — Other Ambulatory Visit (HOSPITAL_COMMUNITY): Payer: Self-pay | Admitting: Urology

## 2022-07-29 DIAGNOSIS — N135 Crossing vessel and stricture of ureter without hydronephrosis: Secondary | ICD-10-CM

## 2022-08-04 DIAGNOSIS — M4325 Fusion of spine, thoracolumbar region: Secondary | ICD-10-CM | POA: Diagnosis not present

## 2022-08-04 DIAGNOSIS — M5116 Intervertebral disc disorders with radiculopathy, lumbar region: Secondary | ICD-10-CM | POA: Diagnosis not present

## 2022-08-11 ENCOUNTER — Encounter (HOSPITAL_COMMUNITY)
Admission: RE | Admit: 2022-08-11 | Discharge: 2022-08-11 | Disposition: A | Discharge status: Home / Self Care | Payer: Medicare HMO | Source: Ambulatory Visit | Attending: Urology | Admitting: Urology

## 2022-08-11 DIAGNOSIS — N1330 Unspecified hydronephrosis: Secondary | ICD-10-CM | POA: Diagnosis not present

## 2022-08-11 DIAGNOSIS — N135 Crossing vessel and stricture of ureter without hydronephrosis: Secondary | ICD-10-CM | POA: Insufficient documentation

## 2022-08-11 MED ORDER — FUROSEMIDE 10 MG/ML IJ SOLN
29.0000 mg | Freq: Once | INTRAMUSCULAR | Status: AC
  Administered 2022-08-11: 29 mg via INTRAVENOUS

## 2022-08-11 MED ORDER — TECHNETIUM TC 99M MERTIATIDE
4.9000 | Freq: Once | INTRAVENOUS | Status: AC
  Administered 2022-08-11: 4.9 via INTRAVENOUS

## 2022-08-11 MED ORDER — FUROSEMIDE 10 MG/ML IJ SOLN
INTRAMUSCULAR | Status: AC
  Filled 2022-08-11: qty 4

## 2022-08-12 ENCOUNTER — Other Ambulatory Visit: Payer: Self-pay | Admitting: Orthopedic Surgery

## 2022-08-12 DIAGNOSIS — M5116 Intervertebral disc disorders with radiculopathy, lumbar region: Secondary | ICD-10-CM

## 2022-08-12 DIAGNOSIS — M7062 Trochanteric bursitis, left hip: Secondary | ICD-10-CM | POA: Diagnosis not present

## 2022-08-21 ENCOUNTER — Other Ambulatory Visit: Payer: Medicare HMO

## 2022-08-29 DIAGNOSIS — R7303 Prediabetes: Secondary | ICD-10-CM | POA: Diagnosis not present

## 2022-08-29 DIAGNOSIS — E782 Mixed hyperlipidemia: Secondary | ICD-10-CM | POA: Diagnosis not present

## 2022-08-29 DIAGNOSIS — N951 Menopausal and female climacteric states: Secondary | ICD-10-CM | POA: Diagnosis not present

## 2022-08-29 DIAGNOSIS — G909 Disorder of the autonomic nervous system, unspecified: Secondary | ICD-10-CM | POA: Diagnosis not present

## 2022-08-29 DIAGNOSIS — J4520 Mild intermittent asthma, uncomplicated: Secondary | ICD-10-CM | POA: Diagnosis not present

## 2022-08-29 DIAGNOSIS — K219 Gastro-esophageal reflux disease without esophagitis: Secondary | ICD-10-CM | POA: Diagnosis not present

## 2022-08-29 DIAGNOSIS — I25119 Atherosclerotic heart disease of native coronary artery with unspecified angina pectoris: Secondary | ICD-10-CM | POA: Diagnosis not present

## 2022-08-29 DIAGNOSIS — R002 Palpitations: Secondary | ICD-10-CM | POA: Diagnosis not present

## 2022-08-29 DIAGNOSIS — G894 Chronic pain syndrome: Secondary | ICD-10-CM | POA: Diagnosis not present

## 2022-09-03 ENCOUNTER — Ambulatory Visit
Admission: RE | Admit: 2022-09-03 | Discharge: 2022-09-03 | Disposition: A | Discharge status: Home / Self Care | Payer: Medicare HMO | Source: Ambulatory Visit | Attending: Orthopedic Surgery | Admitting: Orthopedic Surgery

## 2022-09-03 DIAGNOSIS — M5116 Intervertebral disc disorders with radiculopathy, lumbar region: Secondary | ICD-10-CM

## 2022-09-03 DIAGNOSIS — M5416 Radiculopathy, lumbar region: Secondary | ICD-10-CM | POA: Diagnosis not present

## 2022-09-03 MED ORDER — DIAZEPAM 5 MG PO TABS: 5.0000 mg | ORAL_TABLET | Freq: Once | ORAL | Status: DC

## 2022-09-03 MED ORDER — IOPAMIDOL (ISOVUE-M 200) INJECTION 41%
20.0000 mL | Freq: Once | INTRAMUSCULAR | Status: AC
  Administered 2022-09-03: 20 mL via INTRATHECAL

## 2022-09-03 MED ORDER — MEPERIDINE HCL 50 MG/ML IJ SOLN: 50.0000 mg | Freq: Once | INTRAMUSCULAR | Status: DC | PRN

## 2022-09-03 MED ORDER — ONDANSETRON HCL 4 MG/2ML IJ SOLN: 4.0000 mg | Freq: Once | INTRAMUSCULAR | Status: DC | PRN

## 2022-09-03 NOTE — Discharge Instructions (Signed)

## 2022-09-29 DIAGNOSIS — H524 Presbyopia: Secondary | ICD-10-CM | POA: Diagnosis not present

## 2022-09-29 DIAGNOSIS — H2513 Age-related nuclear cataract, bilateral: Secondary | ICD-10-CM | POA: Diagnosis not present

## 2022-10-07 DIAGNOSIS — D0462 Carcinoma in situ of skin of left upper limb, including shoulder: Secondary | ICD-10-CM | POA: Diagnosis not present

## 2022-10-07 DIAGNOSIS — L814 Other melanin hyperpigmentation: Secondary | ICD-10-CM | POA: Diagnosis not present

## 2022-10-07 DIAGNOSIS — Z85828 Personal history of other malignant neoplasm of skin: Secondary | ICD-10-CM | POA: Diagnosis not present

## 2022-10-07 DIAGNOSIS — L570 Actinic keratosis: Secondary | ICD-10-CM | POA: Diagnosis not present

## 2022-10-07 DIAGNOSIS — L821 Other seborrheic keratosis: Secondary | ICD-10-CM | POA: Diagnosis not present

## 2022-10-07 DIAGNOSIS — D1801 Hemangioma of skin and subcutaneous tissue: Secondary | ICD-10-CM | POA: Diagnosis not present

## 2022-10-07 DIAGNOSIS — L820 Inflamed seborrheic keratosis: Secondary | ICD-10-CM | POA: Diagnosis not present

## 2022-10-20 DIAGNOSIS — M79643 Pain in unspecified hand: Secondary | ICD-10-CM | POA: Diagnosis not present

## 2022-10-20 DIAGNOSIS — G909 Disorder of the autonomic nervous system, unspecified: Secondary | ICD-10-CM | POA: Diagnosis not present

## 2022-10-20 DIAGNOSIS — M1990 Unspecified osteoarthritis, unspecified site: Secondary | ICD-10-CM | POA: Diagnosis not present

## 2022-10-20 DIAGNOSIS — G894 Chronic pain syndrome: Secondary | ICD-10-CM | POA: Diagnosis not present

## 2022-10-20 DIAGNOSIS — M5136 Other intervertebral disc degeneration, lumbar region: Secondary | ICD-10-CM | POA: Diagnosis not present

## 2022-10-20 DIAGNOSIS — M48061 Spinal stenosis, lumbar region without neurogenic claudication: Secondary | ICD-10-CM | POA: Diagnosis not present

## 2022-10-22 DIAGNOSIS — G894 Chronic pain syndrome: Secondary | ICD-10-CM | POA: Diagnosis not present

## 2022-10-22 DIAGNOSIS — M5116 Intervertebral disc disorders with radiculopathy, lumbar region: Secondary | ICD-10-CM | POA: Diagnosis not present

## 2022-10-22 DIAGNOSIS — Z79899 Other long term (current) drug therapy: Secondary | ICD-10-CM | POA: Diagnosis not present

## 2022-10-22 DIAGNOSIS — Z79891 Long term (current) use of opiate analgesic: Secondary | ICD-10-CM | POA: Diagnosis not present

## 2022-10-22 DIAGNOSIS — M4325 Fusion of spine, thoracolumbar region: Secondary | ICD-10-CM | POA: Diagnosis not present

## 2022-10-22 DIAGNOSIS — M7062 Trochanteric bursitis, left hip: Secondary | ICD-10-CM | POA: Diagnosis not present

## 2022-10-29 ENCOUNTER — Ambulatory Visit: Payer: Medicare HMO | Admitting: Cardiology

## 2022-11-05 ENCOUNTER — Other Ambulatory Visit: Payer: Self-pay | Admitting: Cardiology

## 2022-12-01 ENCOUNTER — Ambulatory Visit: Payer: Medicare HMO | Admitting: Psychology

## 2022-12-01 ENCOUNTER — Encounter: Payer: Self-pay | Admitting: Psychology

## 2022-12-01 ENCOUNTER — Ambulatory Visit: Payer: Medicare HMO

## 2022-12-01 DIAGNOSIS — M1990 Unspecified osteoarthritis, unspecified site: Secondary | ICD-10-CM | POA: Insufficient documentation

## 2022-12-01 DIAGNOSIS — G894 Chronic pain syndrome: Secondary | ICD-10-CM

## 2022-12-01 DIAGNOSIS — M47812 Spondylosis without myelopathy or radiculopathy, cervical region: Secondary | ICD-10-CM | POA: Insufficient documentation

## 2022-12-01 DIAGNOSIS — R4189 Other symptoms and signs involving cognitive functions and awareness: Secondary | ICD-10-CM

## 2022-12-01 DIAGNOSIS — F09 Unspecified mental disorder due to known physiological condition: Secondary | ICD-10-CM | POA: Diagnosis not present

## 2022-12-01 DIAGNOSIS — G909 Disorder of the autonomic nervous system, unspecified: Secondary | ICD-10-CM | POA: Insufficient documentation

## 2022-12-01 DIAGNOSIS — M5136 Other intervertebral disc degeneration, lumbar region: Secondary | ICD-10-CM | POA: Insufficient documentation

## 2022-12-01 DIAGNOSIS — I679 Cerebrovascular disease, unspecified: Secondary | ICD-10-CM

## 2022-12-01 NOTE — Progress Notes (Signed)
   Psychometrician Note   Cognitive testing was administered to Briana Bowers by Shan Levans, B.S. (psychometrist) under the supervision of Dr. Newman Nickels, Ph.D., licensed psychologist on 12/01/2022. Briana Bowers did not appear overtly distressed by the testing session per behavioral observation or responses across self-report questionnaires. Rest breaks were offered.    The battery of tests administered was selected by Dr. Newman Nickels, Ph.D. with consideration to Briana Bowers's current level of functioning, the nature of her symptoms, emotional and behavioral responses during interview, level of literacy, observed level of motivation/effort, and the nature of the referral question. This battery was communicated to the psychometrist. Communication between Dr. Newman Nickels, Ph.D. and the psychometrist was ongoing throughout the evaluation and Dr. Newman Nickels, Ph.D. was immediately accessible at all times. Dr. Newman Nickels, Ph.D. provided supervision to the psychometrist on the date of this service to the extent necessary to assure the quality of all services provided.    Briana Bowers will return within approximately 1-2 weeks for an interactive feedback session with Dr. Milbert Coulter at which time her test performances, clinical impressions, and treatment recommendations will be reviewed in detail. Briana Bowers understands she can contact our office should she require our assistance before this time.  A total of 120 minutes of billable time were spent face-to-face with Briana Bowers by the psychometrist. This includes both test administration and scoring time. Billing for these services is reflected in the clinical report generated by Dr. Newman Nickels, Ph.D.  This note reflects time spent with the psychometrician and does not include test scores or any clinical interpretations made by Dr. Milbert Coulter. The full report will follow in a separate note.

## 2022-12-01 NOTE — Progress Notes (Addendum)
NEUROPSYCHOLOGICAL EVALUATION Briana Bowers. [facility] Department of Neurology  Date of Evaluation: December 01, 2022  Reason for Referral:   [name] is a 72 y.o. right-handed Caucasian female referred by Briana Bowers, D.O., to characterize her current cognitive functioning and assist with diagnostic clarity and treatment planning in the context of subjective cognitive decline.   Assessment and Plan:   Clinical Impression(s): Briana Bowers pattern of performance is suggestive of neuropsychological functioning within normal limits relative to age-matched peers. Slight variability was exhibited across memory testing where she had some difficulty recalling a previously learned name, address, and phone number, as well as medication instructions. However, performances across three other memory tasks were consistently appropriate. Performances were also appropriate across processing speed, attention/concentration, executive functioning, safety/judgment, receptive and expressive language, and visuospatial abilities. Briana Bowers denied difficulties completing instrumental activities of daily living (ADLs) independently. Her daughter did not disagree. Briana Bowers does not warrant diagnostic considerations surrounding a neurocognitive disorder at the present time.  From a neurological perspective, current testing is encouraging. Despite some mild memory variability, current testing patterns are not strongly suggestive of symptomatic Alzheimer's disease. Cognitive and behavioral patterns are also not strongly suggestive of Parkinson's disease, Lewy body disease, another more rare parkinsonian presentation, or frontotemporal lobar degeneration. Her most recent brain MRI in March 2024 did reveal mild to moderate microvascular ischemic changes, said to have progressed relative to a prior scan in 2016. Cerebrovascular disease is likely influenced by several medical ailments in her history (e.g.,  coronary artery disease, hypertension, hyperlipidemia, prediabetes, prior NSTEMI). This could be at least partially responsible for day-to-day dysfunction surrounding trouble with processing speed, attention, multi-tasking, and certain aspects of memory.   From a non-neurological perspective, Briana Bowers responses across mood-related questionnaires suggested acute symptoms of mild anxiety and moderate sleep dysfunction. During interview, she further described prominent chronic pain in addition to significant sleep dysfunction. These variables, especially in combination, could also reasonably create day-to-day difficulties with processing speed, attention, multi-tasking, and certain aspects of memory. There are additional concerns for medication side effects as several of her medications (namely hydrocodone, nortriptyline, and robaxin) have well established cognitive side effects.   During interview, Briana Bowers emphasized that some areas of dysfunction, namely trouble with attention/concentration, name recollection, and misplacing/losing objects in her environment were fairly longstanding in nature rather than representing acute change/decline. Overall, the most likely explanation for day-to-day cognitive dysfunction and inefficiency is the combination of cerebrovascular disease, psychiatric factors, poor sleep, and chronic pain, superimposed on her longstanding pattern of strength and weakness and the normal aging process.   Also of note, Briana Bowers reported concern surrounding intermittent hand jerking behaviors. Neurologically speaking, jerking behaviors in general can be seen in a variety of conditions, including Parkinson's disease, corticobasal degeneration, multiple sclerosis, ALS, encephalitis, and TBI. As stated above, current testing does not offer compelling evidence for the presence of these conditions. This experience (i.e., hyperreflexia) may be related to her prior spinal procedure and ongoing  spinal dysfunction. It can also be seen with anxiety, hyperthyroidism, and vitamin deficiencies. She should discuss this symptom more directly with her neurologist.   Recommendations: If there is concern surrounding progressive cognitive decline in the future, a repeat evaluation could be completed at that time. The current evaluation will serve as an excellent baseline for future comparisons.   As stated above, several of Briana Bowers's medications (i.e., hydrocodone, nortriptyline, and robaxin) have well established cognitive side effects. She reported that she had  discontinued robaxin 2-3 weeks prior to the current evaluation and has been working to diminish her use of the other medications. I would encourage her to continue this process as long as it continues to be approved by her medical providers.   A combination of medication and psychotherapy has been shown to be most effective at treating symptoms of anxiety and depression. As such, Briana Bowers could speak with her prescribing physician regarding medication adjustments to optimally manage these symptoms. Likewise, Briana Bowers could consider engaging in short-term psychotherapy to address symptoms of psychiatric distress. She would benefit from an active and collaborative therapeutic environment, rather than one purely supportive in nature. Recommended treatment modalities include Cognitive Behavioral Therapy (CBT) or Acceptance and Commitment Therapy (ACT).  Performance across neurocognitive testing is not a strong predictor of an individual's safety operating a motor vehicle. Should her family wish to pursue a formalized driving evaluation, they could reach out to the following agencies: [employer] in [place]: [phone] [facility]: [phone] [facility]: [phone] [facility]: [phone] or [phone]  Briana Bowers is encouraged to attend to lifestyle factors for brain health (e.g.,  regular physical exercise, good nutrition habits and consideration of the MIND-DASH diet, regular participation in cognitively-stimulating activities, and general stress management techniques), which are likely to have benefits for both emotional adjustment and cognition. In fact, in addition to promoting good general health, regular exercise incorporating aerobic activities (e.g., brisk walking, jogging, cycling, etc.) has been demonstrated to be a very effective treatment for depression and stress, with similar efficacy rates to both antidepressant medication and psychotherapy. Optimal control of vascular risk factors (including safe cardiovascular exercise and adherence to dietary recommendations) is encouraged. Continued participation in activities which provide mental stimulation and social interaction is also recommended.   Memory can be improved using internal strategies such as rehearsal, repetition, chunking, mnemonics, association, and imagery. External strategies such as written notes in a consistently used memory journal, visual and nonverbal auditory cues such as a calendar on the refrigerator or appointments with alarm, such as on a cell phone, can also help maximize recall.    When learning new information, she would benefit from information being broken up into small, manageable pieces. she may also find it helpful to articulate the material in her own words and in a context to promote encoding at the onset of a new task. This material may need to be repeated multiple times to promote encoding.  To address problems with processing speed, she may wish to consider:   -Ensuring that she is alerted when essential material or instructions are being presented   -Adjusting the speed at which new information is presented   -Allowing for more time in comprehending, processing, and responding in conversation   -Repeating and paraphrasing instructions or conversations aloud  To address problems with  fluctuating attention and/or executive dysfunction, she may wish to consider:   -Avoiding external distractions when needing to concentrate   -Limiting exposure to fast paced environments with multiple sensory demands   -Writing down complicated information and using checklists   -Attempting and completing one task at a time (i.e., no multi-tasking)   -Verbalizing aloud each step of a task to maintain focus   -Taking frequent breaks during the completion of steps/tasks to avoid fatigue   -Reducing the amount of information considered at one time   -Scheduling more difficult activities for a time of day where she is usually most alert  Review of Records:  Ms. Rech was seen by [facility] Nita Sickle, D.O.) on 07/29/2013 for an evaluation of atypical chest and left arm pain. Briefly, during summer 2014, she developed left hand and elbow pain which progressively worsened. She was told she had tennis elbow and did physical therapy which provided little relief. On 12/27/2012, she developed acute onset of left under arm and shoulder pain and went to the emergency department. She was evaluated by cardiology, had a stent placed to LAD in September 2014, but had persistent chest discomfort despite intervention. She underwent repeat catheterization which showed patent stents. She continued to have left-sided squeezing in her chest. Pain was localized "where I have the stents" and she described it as a "tight sensation like a blood pressure cuff." Pain is described as a hot/burning sensation, starting in her left breast and upper arm, radiating into her left forearm. She denied associated weakness, numbness, or tingling.  She practiced neuromuscular studies and reports her pain was alleviated by "working on her anterior cervical muscles." She had similar symptoms after her NSTEMI in 1999 which resolved with nortriptyline.  She met for the final time with Dr. Allena Katz on 07/03/2014 for follow-up. At that time.  Ms. Hasbun reported complaints of low blood pressure and constantly feeling exhausted. She contacted her cardiologist's office whose nurse recommended that she make an appointment to see her neurologist for POTS. She was not evaluated in the clinic. Her blood pressure had been ranging 90/50s and if she drank Powerade would increase into SBP 110s. She noticed that when her blood pressure is low, her heart rate increases. She was drinking about 5L of water daily at that time. She denied positional lightheadedness, but noticed it when she is sitting (which is brief and spontaneously improved). She denied tremor, stiffness, or dysphagia. During that appointment, Ms. Whillock also described stuttering and increased memory concerns. Examples surrounding the latter included forgetting names frequently and requiring more intentional focus. Performance on a brief cognitive screening instrument (MOCA) was 21/30. She was referred for a brain MRI and a neuropsychological evaluation. She did not follow through on the latter recommendation to my knowledge.  She was next seen by [facility] Lurena Joiner Tat, D.O.) on 08/26/2021 for an evaluation of an intention tremor. However, Ms. Gilchrest did reportedly state to Dr. Arbutus Leas that she was truly there for an evaluation surrounding cognitive concerns rather than tremulous experiences. There was minimal discussion of cognitive dysfunction outside of an isolated example where Ms. Tietze reportedly put ice cream in her cabinet as opposed to the freezer following her myocardial infarction. ADLs were described as intact. Dr. Arbutus Leas theorized that cognitive changes could be impacted by medication side effects (i.e., hydrocodone, nortriptyline, robaxin). However, Ms. Crouse did not appear receptive to this conceptualization. Ultimately, Ms. Fred was referred for a comprehensive neuropsychological evaluation to characterize her cognitive abilities and to assist with diagnostic clarity and treatment  planning.   Neuroimaging Brain MRI on 07/16/2014 revealed mild to moderate microvascular ischemic changes affecting the cerebral hemisphere white matter. No infarctions or age-advanced atrophy were noted. Brain MRI on 06/23/2022 again revealed mild to moderate microvascular ischemic changes, said to have progressed relative to her 2016 scan. No age advanced atrophy was noted.   Past Medical History:  Diagnosis Date   Anosmia 03/09/2018   Atherosclerotic heart disease of native coronary artery without angina pectoris 12/27/2012   RCA stent in the setting of non-ST elevation MI in 1999   Autonomic dysfunction    pt has episodes of  tachycardia and hypotension   Autonomic neuropathy    CAD (coronary artery disease), native coronary artery 12/27/2012   a. NSTEMI/RCA stent 1999. b. LAD stent 2014. c. Neg nuc 11/2014.   Cervical spondylosis    Chest pain, unspecified    Chronic fatigue 12/27/2012   Chronic lower back pain    Chronic pain syndrome    Chronic sinusitis 03/09/2018   Constipation due to pain medication 01/08/2022   Degeneration of lumbar intervertebral disc    Degenerative joint disease 12/27/2012   Essential hypertension 08/20/2021   Gastroesophageal reflux disease 12/27/2012   Generalized anxiety disorder 12/27/2012   resolved per pt as of 12/02/21   History of blood transfusion    w/tonsillectomy; w/spinal fusion (2004)   History of COVID-19 07/17/2020   History of hiatal hernia    History of spinal fusion 12/26/2021   Hyperlipidemia    Hypotension    a. prompting holding of BB.   Idiopathic hypotension 05/13/2016   Neurogenic claudication due to lumbar spinal stenosis 09/18/2021   Non-ST elevation myocardial infarction (NSTEMI) 1999   Osteoarthritis    Palpitations 04/28/2022   Peroneal neuropathy at knee, left 06/02/2022   Postoperative anemia 12/26/2021   Postoperative nausea and vomiting 01/08/2022   Prediabetes 12/27/2012   Premature atrial contraction 07/22/2022    Presence of coronary artery bypass graft stent 12/26/2021   Sinus arrest 12/17/2017   3.2 second sinus pause, aslo PVCs and PACs, asymptomatic, now has loop recorder   Wears glasses     Past Surgical History:  Procedure Laterality Date   APPENDECTOMY  1965   BACK SURGERY     BILATERAL SALPINGOOPHORECTOMY Bilateral 1978   "2 separate ORs; months apart" (12/27/2012)   CARDIAC CATHETERIZATION  2000's   "couple times" (12/27/2012)   CHOLECYSTECTOMY  08/1989   CORONARY ANGIOPLASTY WITH STENT PLACEMENT  08/03/1997; 12/28/2012   1 + 1" (01/19/2013)   CYSTOSCOPY W/ URETERAL STENT PLACEMENT Left 12/06/2021   Procedure: CYSTOSCOPY WITH RETROGRADE PYELOGRAM/URETERAL STENT PLACEMENT;  Surgeon: Rene Paci, MD;  Location: [facility];  Service: Urology;  Laterality: Left;   DILATION AND CURETTAGE OF UTERUS  09/1967   "after miscarriage" (12/27/2012)   FINGER ARTHROPLASTY Right 12/232011   "ring finger; fused it; put pin in it; 2nd joint" (12/27/2012)   FRACTIONAL FLOW RESERVE WIRE  12/28/2012   Procedure: FRACTIONAL FLOW RESERVE WIRE;  Surgeon: Lesleigh Noe, MD;  Location: [facility] CATH LAB;  Service: Cardiovascular;;  PROX LAD   GANGLION CYST EXCISION Right 1990's   GUM SURGERY  2011   "tissue transplant; 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" (12/27/2012)   HERNIA REPAIR  02/2002   "ventral" (12/27/2012)   LEFT HEART CATHETERIZATION WITH CORONARY ANGIOGRAM N/A 12/28/2012   Procedure: LEFT HEART CATHETERIZATION WITH CORONARY ANGIOGRAM;  Surgeon: Lesleigh Noe, MD;  Location: [facility] CATH LAB;  Service: Cardiovascular;  Laterality: N/A;   LEFT HEART CATHETERIZATION WITH CORONARY ANGIOGRAM N/A 01/20/2013   Procedure: LEFT HEART CATHETERIZATION WITH CORONARY ANGIOGRAM;  Surgeon: Micheline Chapman, MD;  Location: [facility] CATH LAB;  Service: Cardiovascular;  Laterality: N/A;   LIGAMENT REPAIR Right 1990's   "radial collateral" (12/27/2012)   LUMBAR LAMINECTOMY  1989; 1990's    "twice" (12/27/2012)   MASS EXCISION Left 1990's   "groin" (12/27/2012)   NASAL SEPTUM SURGERY  1969   PARTIAL HYSTERECTOMY  1974   PERCUTANEOUS CORONARY STENT INTERVENTION (PCI-S)  12/28/2012   Procedure: PERCUTANEOUS CORONARY STENT INTERVENTION (  PCI-S);  Surgeon: Lesleigh Noe, MD;  Location: [facility] CATH LAB;  Service: Cardiovascular;;  Prox LAD   POSTERIOR LUMBAR FUSION  04/2002   TONSILLECTOMY  1964   TYMPANOPLASTY Bilateral 1966; 2001   "left; right" (12/27/2012)    Current Outpatient Medications:    acidophilus (RISAQUAD) CAPS capsule, Take 1 capsule by mouth daily., Disp: , Rfl:    albuterol (VENTOLIN HFA) 108 (90 Base) MCG/ACT inhaler, Inhale 2 puffs into the lungs every 6 (six) hours as needed for wheezing or shortness of breath. Uses when sick. She doesn't have to use often per pt on 12/02/21., Disp: , Rfl:    aspirin EC 81 MG tablet, Take 1 tablet (81 mg total) by mouth daily. Swallow whole., Disp: 90 tablet, Rfl: 3   b complex vitamins capsule, Take 1 capsule by mouth daily., Disp: , Rfl:    bisoprolol (ZEBETA) 10 MG tablet, TAKE 1 TABLET (10 MG TOTAL) BY MOUTH DAILY AS NEEDED (IF HEART RATE 100 OR GREATER). PATIENT REPORTEDLY TAKES IT FOR PALPITATIONS FOR MANY YEARS, Disp: 90 tablet, Rfl: 1   budesonide-formoterol (SYMBICORT) 160-4.5 MCG/ACT inhaler, Inhale 2 puffs into the lungs 2 (two) times daily as needed (sob/wheezing)., Disp: , Rfl:    cetirizine (ZYRTEC) 10 MG tablet, Take 10 mg by mouth as needed., Disp: , Rfl:    Cholecalciferol (VITAMIN D3 PO), Take 1 tablet by mouth daily., Disp: , Rfl:    Coenzyme Q10 (CO Q 10 PO), Take 1 tablet by mouth daily., Disp: , Rfl:    estrogen-methylTESTOSTERone (ESTRATEST) 1.25-2.5 MG per tablet, Take 1 tablet by mouth at bedtime., Disp: , Rfl:    fish oil-omega-3 fatty acids 1000 MG capsule, Take 1 g by mouth daily., Disp: , Rfl:    fludrocortisone (FLORINEF) 0.1 MG tablet, Take 1 tablet (0.1 mg total) by mouth as needed. For episodes of  lightheadedness, Disp: 60 tablet, Rfl: 22   fluticasone (FLONASE) 50 MCG/ACT nasal spray, Place 2 sprays into both nostrils as needed., Disp: , Rfl:    Glucosamine 500 MG CAPS, Take by mouth. Pt unsure of dosage., Disp: , Rfl:    HYDROcodone-acetaminophen (NORCO) 10-325 MG tablet, Take 1 tablet by mouth every 6 (six) hours as needed for moderate pain or severe pain. Takes 4 times a day on a daily basis., Disp: , Rfl:    meloxicam (MOBIC) 15 MG tablet, Take 15 mg by mouth daily as needed for pain., Disp: , Rfl:    methocarbamol (ROBAXIN) 500 MG tablet, Take 250 mg by mouth 3 (three) times daily., Disp: , Rfl:    nortriptyline (PAMELOR) 50 MG capsule, Take 50 mg by mouth at bedtime., Disp: , Rfl:    pantoprazole (PROTONIX) 40 MG tablet, Take 40 mg by mouth as needed., Disp: , Rfl:    rosuvastatin (CRESTOR) 10 MG tablet, Take 1 tablet (10 mg total) by mouth at bedtime., Disp: 90 tablet, Rfl: 3  Clinical Interview:   The following information was obtained during a clinical interview with Ms. Daigneault prior to cognitive testing.  Cognitive Symptoms: Decreased short-term memory: Endorsed. She reported ongoing difficulties primarily surrounding trouble with name recollection and misplacing things in her environment. These were said to be fairly longstanding in nature and perhaps acutely worsened. She reported that, in general, she notes greater memory dysfunction when she is dehydrated or not sufficiently fed. Her daughter was generally in agreement. Despite some longstanding aspects of said memory concerns, both she and her daughter did note some more acute  progressive decline over the past several years.  Decreased long-term memory: Denied. Decreased attention/concentration: Endorsed. She reported fairly longstanding difficulties with sustained attention and distractibility. Her daughter noted that the latter domain has seemed far more challenging lately. She did acknowledge that Ms. Vogan does serve as her  husband's primary caregiver and has "a lot on her plate" which could be influencing focus and distractibility.  Reduced processing speed: Denied. Her daughter alluded to some concerns surrounding slowed processing speed (e.g., it may take Ms. Rawe far longer to perform meal preparation).  Difficulties with executive functions: Endorsed. She primarily reported trouble with disorganization. She generally denied trouble with decision making, multi-tasking, or impulsivity. Her daughter noted some personality changes in that Ms. Stjacques has become far more easily irritated, more argumentative, and can become defensive very quickly. Some of the later is caused by Ms. Merta making contradictory statements and not recalling opinions or plans she had allegedly stated days/weeks previously.  Difficulties with emotion regulation: Denied. Difficulties with receptive language: Denied. Difficulties with word finding: Endorsed "sometimes." Her daughter noted a tendency for Ms. Borjon to "just start talking," causing it to sometimes be challenging for listeners to understand the full context.  Decreased visuoperceptual ability: Denied.  Difficulties completing ADLs: Denied.  Additional Medical History: History of traumatic brain injury/concussion: Denied. Her daughter stated that Ms. Schickling likely hit her head following a fall several years ago that led to spinal damage and subsequent fusion procedure.  History of stroke: Denied. History of seizure activity: Denied. History of known exposure to toxins: Denied. Symptoms of chronic pain: Endorsed. She experiences diffuse chronic pain on a daily basis and has taken medications over the years to directly address this. She has been actively lessening her medication intake under the supervision of her doctors and noted that overall, pain levels do seem diminished currently relative to where they have been in the past.  Experience of frequent headaches/migraines:  Denied. Frequent instances of dizziness/vertigo: Denied.  Sensory changes: She wears glasses with benefit. Medical records also suggest a history of anosmia. She did not report other sensory changes/difficulties (e.g., hearing, taste). Balance/coordination difficulties: Denied. She also denied any recent falls. Other motor difficulties: Ms. Reda denied consistent active or resting tremors. She did describe some intermittent jerking behaviors involving her upper extremities. Her daughter was in agreement, noting that Ms. Soland has unintentionally sent food "flying" around the kitchen at times. The cause for this was unknown. Prior medical records surrounding her spinal fusion and other neck/back pain have suggested symptoms of hyperreflexia.   Sleep History: Estimated hours obtained each night: 5-8 hours.  Difficulties falling asleep: Endorsed. Difficulties staying asleep: Variably so.  Feels rested and refreshed upon awakening: Denied. She reported "not usually" waking feeling rested in the mornings.   History of snoring: Denied. History of waking up gasping for air: Denied. Witnessed breath cessation while asleep: Denied.  History of vivid dreaming: Endorsed. Her daughter noted that she and Ms. Goostree's husband have observed Ms. Monk talking and yelling in her sleep from time to time. Ms. Lesesne noted that this was an infrequent occurrence.  Excessive movement while asleep: Denied. Instances of acting out her dreams: Denied.  Psychiatric/Behavioral Health History: Depression: She described her current mood as "seems okay" and denied to her knowledge any prior mental health concerns or diagnoses surrounding depressive conditions. She denied current or remote suicidal ideation, intent, or plan.  Anxiety: Denied. Medical records suggest a history of generalized anxiety disorder with a notation that Ms.  Sciandra reported symptom alleviation in August 2023. Mania: Denied. Trauma History:  Denied. Visual/auditory hallucinations: Denied. Delusional thoughts: Denied.  Tobacco: Denied. Alcohol: She denied current alcohol consumption as well as a history of problematic alcohol abuse or dependence.  Recreational drugs: Denied.  Family History: Problem Relation Age of Onset   Coronary artery disease Mother    Heart failure Father    Coronary artery disease Father    Heart disease Sister    Other Daughter        TBI   Other Son        Suicide   This information was confirmed by Ms. Metzger.  Academic/Vocational History: Highest level of educational attainment: 12 years. She graduated from high school and described herself as a good (A/B) student in academic settings. She identified history as a potential relative weakness but generally attributed this to lack of interest.   History of developmental delay: Denied. History of grade repetition: Denied. Enrollment in special education courses: Denied. History of LD/ADHD: Denied.  Employment: She reported being disabled in 1989. Prior to this, she worked as a Sports administrator.   Evaluation Results:   Behavioral Observations: Ms. Musco was accompanied by her daughter, arrived to her appointment on time, and was appropriately dressed and groomed. She appeared alert and oriented. Observed gait and station were within normal limits. Gross motor functioning appeared intact upon informal observation and no abnormal movements (e.g., tremors, jerking) were noted. Her affect was generally relaxed and positive, but did range appropriately given the subject being discussed during the clinical interview. There were times where Ms. Dahmer appeared quite defensive. This generally surrounded a discussion about testing procedures and how tasks were scored, as well as when her daughter would provide conflicting information. Spontaneous speech was fluent and word finding difficulties were not observed during the clinical interview. Thought processes  were coherent, organized, and normal in content. Insight into her cognitive difficulties appeared adequate.   During testing, sustained attention was appropriate. Task engagement was adequate and she persisted when challenged. Overall, Ms. Fujitani was cooperative with the clinical interview and subsequent testing procedures.   Adequacy of Effort: The validity of neuropsychological testing is limited by the extent to which the individual being tested may be assumed to have exerted adequate effort during testing. Ms. Reihl expressed her intention to perform to the best of her abilities and exhibited adequate task engagement and persistence. Scores across stand-alone and embedded performance validity measures were within expectation. As such, the results of the current evaluation are believed to be a valid representation of Ms. Yardley's current cognitive functioning.  Test Results: Ms. Ellingwood was fully oriented at the time of the current evaluation.  Intellectual abilities based upon educational and vocational attainment were estimated to be in the average range. Premorbid abilities were estimated to be within the average range based upon a single-word reading test.   Processing speed was average to above average. Basic attention was average. More complex attention (e.g., working memory) was exceptionally high. Executive functioning was average. She performed in the well above average range across a task assessing safety and judgment.  Assessed receptive language abilities were above average. Likewise, Ms. Rotruck did not exhibit any difficulties comprehending task instructions and answered all questions asked of her appropriately. Assessed expressive language (e.g., verbal fluency and confrontation naming) was average to above average.     Assessed visuospatial/visuoconstructional abilities were average to above average.    Learning (i.e., encoding) of novel verbal and visual information was  below average  to average. Spontaneous delayed recall (i.e., retrieval) of previously learned information was well below average across a daily living task (i.e., name, address, phone number, and medication instructions) but below average to average across all other tasks. Retention rates were 97% across a story learning task, 80% across a list learning task, 50% across a daily living task, and 100% across a shape learning task. Performance across recognition tasks was exceptionally low across a daily living task but average to above average across all other tasks, suggesting evidence for information consolidation.   Results of emotional screening instruments suggested that recent symptoms of generalized anxiety were in the mild range, while symptoms of depression were within normal limits. A screening instrument assessing recent sleep quality suggested the presence of moderate sleep dysfunction.  Tables of Scores:   Note: This summary of test scores accompanies the interpretive report and should not be considered in isolation without reference to the appropriate sections in the text. Descriptors are based on appropriate normative data and may be adjusted based on clinical judgment. Terms such as "Within Normal Limits" and "Outside Normal Limits" are used when a more specific description of the test score cannot be determined.       Percentile - Normative Descriptor > 98 - Exceptionally High 91-97 - Well Above Average 75-90 - Above Average 25-74 - Average 9-24 - Below Average 2-8 - Well Below Average < 2 - Exceptionally Low       Validity:   DESCRIPTOR       DCT: --- --- Within Normal Limits  NAB EVI: --- --- Within Normal Limits  D-KEFS Color Word EI: --- --- Within Normal Limits       Orientation:      Raw Score Percentile   NAB Orientation, Form 1 29/29 --- ---       Cognitive Screening:      Raw Score Percentile   SLUMS: 28/30 --- ---       Intellectual Functioning:      Standard Score  Percentile   Test of Premorbid Functioning: 109 73 Average       Memory:     NAB Memory Module, Form 1: Standard Score/ T Score Percentile   Total Memory Index 83 13 Below Average  List Learning       Total Trials 1-3 19/36 (46) 34 Average    List B 2/12 (38) 12 Below Average    Short Delay Free Recall 5/12 (43) 25 Average    Long Delay Free Recall 4/12 (40) 16 Below Average    Retention Percentage 80 (46) 34 Average    Recognition Discriminability 5 (45) 31 Average  Shape Learning       Total Trials 1-3 11/27 (41) 18 Below Average    Delayed Recall 4/9 (45) 31 Average    Retention Percentage 100 (50) 50 Average    Recognition Discriminability 9 (62) 88 Above Average  Story Learning       Immediate Recall 51/80 (42) 21 Below Average    Delayed Recall 30/40 (48) 42 Average    Retention Percentage 97 (51) 54 Average  Daily Living Memory       Immediate Recall 37/51 (44) 27 Average    Delayed Recall 7/17 (31) 3 Well Below Average    Retention Percentage 50 (28) 2 Well Below Average    Recognition Hits 5/10 (20) <1 Exceptionally Low       Attention/Executive Function:     Trail Making Test (TMT): Raw  Score (T Score) Percentile     Part A 40 secs.,  0 errors (46) 34 Average    Part B 85 secs.,  1 error (54) 66 Average         Scaled Score Percentile   WAIS-IV Coding: 10 50 Average       NAB Attention Module, Form 1: T Score Percentile     Digits Forward 55 69 Average    Digits Backwards 72 99 Exceptionally High        Scaled Score Percentile   WAIS-IV Similarities: 8 25 Average       D-KEFS Color-Word Interference Test: Raw Score (Scaled Score) Percentile     Color Naming 33 secs. (10) 50 Average    Word Reading 21 secs. (12) 75 Above Average    Inhibition 77 secs. (9) 37 Average      Total Errors 4 errors (9) 37 Average    Inhibition/Switching 69 secs. (11) 63 Average      Total Errors 1 error (12) 75 Above Average       NAB Executive Functions Module, Form 1: T  Score Percentile     Judgment 65 93 Well Above Average       Language:     Verbal Fluency Test: Raw Score (T Score) Percentile     Phonemic Fluency (FAS) 51 (61) 86 Above Average    Animal Fluency 16 (45) 31 Average        NAB Language Module, Form 1: T Score Percentile     Auditory Comprehension 58 79 Above Average    Naming 31/31 (59) 82 Above Average       Visuospatial/Visuoconstruction:      Raw Score Percentile   Clock Drawing: 9/10 --- Within Normal Limits       NAB Spatial Module, Form 1: T Score Percentile     Figure Drawing Copy 60 84 Above Average        Scaled Score Percentile   WAIS-IV Block Design: 10 50 Average       Mood and Personality:      Raw Score Percentile   Geriatric Depression Scale: 5 --- Within Normal Limits  Geriatric Anxiety Scale: 12 --- Mild    Somatic 9 --- Mild    Cognitive 1 --- Minimal    Affective 2 --- Minimal       Additional Questionnaires:      Raw Score Percentile   PROMIS Sleep Disturbance Questionnaire: 31 --- Moderate   Informed Consent and Coding/Compliance:   The current evaluation represents a clinical evaluation for the purposes previously outlined by the referral source and is in no way reflective of a forensic evaluation.   Ms. Dejoseph was provided with a verbal description of the nature and purpose of the present neuropsychological evaluation. Also reviewed were the foreseeable risks and/or discomforts and benefits of the procedure, limits of confidentiality, and mandatory reporting requirements of this provider. The patient was given the opportunity to ask questions and receive answers about the evaluation. Oral consent to participate was provided by the patient.   This evaluation was conducted by Newman Nickels, Ph.D., ABPP-CN, board certified clinical neuropsychologist. Ms. Goulart completed a clinical interview with Dr. Milbert Coulter, billed as one unit [phone], and 120 minutes of cognitive testing and scoring, billed as one unit [phone]  and three additional units [id number]. Psychometrist Shan Levans, B.S. assisted Dr. Milbert Coulter with test administration and scoring procedures. As a separate and discrete service, one unit [phone] and two units  [id number] were billed for Dr. Tammy Sours time spent in interpretation and report writing.

## 2022-12-09 ENCOUNTER — Ambulatory Visit: Payer: Medicare HMO | Admitting: Psychology

## 2022-12-09 DIAGNOSIS — I679 Cerebrovascular disease, unspecified: Secondary | ICD-10-CM | POA: Diagnosis not present

## 2022-12-09 DIAGNOSIS — F09 Unspecified mental disorder due to known physiological condition: Secondary | ICD-10-CM

## 2022-12-09 DIAGNOSIS — G894 Chronic pain syndrome: Secondary | ICD-10-CM

## 2022-12-09 NOTE — Progress Notes (Signed)
   Neuropsychology Feedback Session Briana Bowers. [facility] [place] Department of Neurology  Reason for Referral:   Briana Bowers is a 72 y.o. right-handed Caucasian female referred by Kerin Salen, D.O., to characterize her current cognitive functioning and assist with diagnostic clarity and treatment planning in the context of subjective cognitive decline.  Feedback:   Briana Bowers completed a comprehensive neuropsychological evaluation on 12/01/2022. Please refer to that encounter for the full report and recommendations. Briefly, results suggested neuropsychological functioning within normal limits relative to age-matched peers. Slight variability was exhibited across memory testing where she had some difficulty recalling a previously learned name, address, and phone number, as well as medication instructions. However, performances across three other memory tasks were consistently appropriate. Performances were also appropriate across processing speed, attention/concentration, executive functioning, safety/judgment, receptive and expressive language, and visuospatial abilities. During interview, Briana Bowers emphasized that some areas of dysfunction, namely trouble with attention/concentration, name recollection, and misplacing/losing objects in her environment were fairly longstanding in nature rather than representing acute change/decline. Overall, the most likely explanation for day-to-day cognitive dysfunction and inefficiency is the combination of cerebrovascular disease, psychiatric factors, poor sleep, and chronic pain, superimposed on her longstanding pattern of strength and weakness and the normal aging process.   Briana Bowers was accompanied by her daughter during the current feedback session. Content of the current session focused on the results of her neuropsychological evaluation. Briana Bowers was given the opportunity to ask questions and her questions were answered. She was encouraged to  reach out should additional questions arise. A copy of her report was provided at the conclusion of the visit.      One unit [phone] was billed for Dr. Tammy Sours time spent preparing for, conducting, and documenting the current feedback session with Briana Bowers.

## 2022-12-10 DIAGNOSIS — M5116 Intervertebral disc disorders with radiculopathy, lumbar region: Secondary | ICD-10-CM | POA: Diagnosis not present

## 2022-12-16 ENCOUNTER — Encounter: Payer: Self-pay | Admitting: Physician Assistant

## 2022-12-16 ENCOUNTER — Ambulatory Visit: Payer: Medicare HMO | Admitting: Physician Assistant

## 2022-12-16 VITALS — BP 103/59 | HR 76 | Ht 62.0 in | Wt 133.0 lb

## 2022-12-16 DIAGNOSIS — F09 Unspecified mental disorder due to known physiological condition: Secondary | ICD-10-CM | POA: Diagnosis not present

## 2022-12-16 DIAGNOSIS — G894 Chronic pain syndrome: Secondary | ICD-10-CM

## 2022-12-16 NOTE — Progress Notes (Signed)
Assessment/Plan:   Memory Difficulties  Briana Bowers is a very pleasant 72 y.o. RH female with a history of CAD s/p PCI stent (12/2012), hyperlipidemia, chronic back pain with disc herniation s/p lumbar fusion L4-S1 (2004), prediabetes, GERD, and anxiety tremors by history, seen at our office, but felt to be due to medications including hydrocodone, without neurological findings, not believed to be due to Parkinson's disease, presenting today in follow-up for evaluation of subjective memory complaints.  However, she was seen after neuropsychological evaluation on with normal results,  likely a combination of cerebrovascular disease, psychiatric factors, poor sleep and chronic pain,and medications.   As for movement issues, in today's visit, no tremors or other parkinsonian signs were noted.  In any case, she was offered to follow-up with our movement disorders specialist, but she declines.  She wanted to be referred to a different practice, we explained that we did not do that, but if she wishes to seek opinion regarding movement elsewhere, she will have to ask her family physician to that referral.   Recommendations:   Follow up as needed Minimize as much as possible to use of any muscle relaxers, pain medications, etc. Recommend good control of cardiovascular risk factors Continue to control mood as per PCP Recommend psychotherapy for anxiety and depression No indication for further testing or antidementia medication.    Subjective:   This patient is accompanied in the office by her daughter.  Previous records as well as any outside records available were reviewed prior to todays visit.        Any changes in memory since last visit?  In the past, she has some difficulty recalling the previously her name, address and phone number for medication instruction.  Initially, she reported that she is having memory issues, but later in the day visit, she reported that she is "fine ".    Repeats  oneself?  Endorsed Disoriented when walking into a room?  Patient denies   Leaving objects in unusual places?  Occasionally, but not worse.   Wandering behavior?   denies   Any personality changes?  She denies, but her responses at times are abrupt, confrontational during this visit.  Any worsening depression?: Situational depression and anxiety because of her husband has cancer. Hallucinations or paranoia?  denies   Seizures?   denies    Any sleep changes? Sleeps well. Reports vivid dreams "Always had" but sometimes she cries out in her sleep but is not new denies REM behavior or sleepwalking   Sleep apnea?   Denies.    Any hygiene concerns?   Denies.   Independent of bathing and dressing?  Endorsed  Does the patient needs help with medications? Patient is in charge.    Who is in charge of the finances?  Patient is in charge    Any changes in appetite?  denies    Patient have trouble swallowing?  denies   Does the patient cook?  Any kitchen accidents such as leaving the stove on?   denies   Any headaches?    Denies.   Vision changes? Denies. Chronic pain?  She is having issues with musculoskeletal pain, currently in her arm, of unclear etiology. Ambulates with difficulty?  She was cautiously, using a brace. Recent falls or head injuries? Endorsed.    Any strokelike symptoms?  Denies. Any tremors?  She has subjective complaints regarding tremors, and her daughter reports that sometimes she has some involuntary movements, presents before, but not during this visit.  Any anosmia?    denies   Any incontinence of urine?  denies   Any bowel dysfunction?  denies      Patient lives with her husband.  Does the patient drive? No issues   Evaluation for tremor 08/27/22 , Dr. Arbutus Leas  Tremor: Yes.                How long has it been going on? She has trouble telling me how long, but at least a couple of years             At rest or with activation?  activation             When is it noted the most?   Putting laundry away             Fam hx of tremor?  Yes.  , mother has little bit of tremor             Located where?  Bilateral UE             Affected by caffeine:  doesn't drink caffeine             Affected by alcohol:  doesn't drink alcohol             Affected by stress:  No.             Affected by fatigue:  Yes.               Spills glass of liquid if full:  No.             Affects ADL's (tying shoes, brushing teeth, etc):  No., but has trouble due to "Pain in ligaments in my arms"             Tremor inducing meds:  Yes., symbicort (rarely takes), albuterol (rarely takes)   Other Specific Symptoms:  Voice: little softer Postural symptoms:  No.             Falls?  No. - none since 01/2020 Bradykinesia symptoms: difficulty getting out of a chair due to pain but able to do without hands Loss of smell:  Yes, attributes to a virus she had at end of 2019 and bad since then Loss of taste:  Yes.   Urinary Incontinence: has some urgency and wears pad b/c of it Difficulty Swallowing:  No., only with very large capsules Handwriting, micrographia: No. Memory changes:  Yes.  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  Pt does cooking.  "I do the driving and I have no issues with the driving."  She prepares her own pillbox and her husbands.  States that her memory is not due to "the drugs, as it has been going on for years."  States that "if it were the drugs it would be more intermittent."  States that years ago, after an MI, she put ice cream in cabinet.   N/V:  Yes.  , chronic nausea due to pain and meds - takes meclizine daily for the nausea Lightheaded:  rare             Syncope: No. Diplopia:  No.   Neuroimaging of the brain has  previously been performed, personally reviewed bt Dr. Arbutus Leas .  It was done in 2016, also for memory change.  There is mild to moderate small vessel  disease.   The largest T2 hyperintensities in the left frontotemporal region.     MRI of the brain March 2024, personally reviewed shows mild to moderate microvascular ischemic changes.  Neuropsych evaluation 12/09/2022 briefly, results suggested neuropsychological functioning within normal limits relative to age-matched peers. Slight variability was exhibited across memory testing where she had some difficulty recalling a previously learned name, address, and phone number, as well as medication instructions. However, performances across three other memory tasks were consistently appropriate. Performances were also appropriate across processing speed, attention/concentration, executive functioning, safety/judgment, receptive and expressive language, and visuospatial abilities. During interview, Briana Bowers emphasized that some areas of dysfunction, namely trouble with attention/concentration, name recollection, and misplacing/losing objects in her environment were fairly longstanding in nature rather than representing acute change/decline. Overall, the most likely explanation for day-to-day cognitive dysfunction and inefficiency is the combination of cerebrovascular disease, psychiatric factors, poor sleep, and chronic pain, superimposed on her longstanding pattern of strength and weakness and the normal aging process.      Past Medical History:  Diagnosis Date   Anosmia 03/09/2018   Atherosclerotic heart disease of native coronary artery without angina pectoris 12/27/2012   RCA stent in the setting of non-ST elevation MI in 1999   Autonomic dysfunction    pt has episodes of tachycardia and hypotension   Autonomic neuropathy    CAD (coronary artery disease), native coronary artery 12/27/2012   a. NSTEMI/RCA stent 1999. b. LAD stent 2014. c. Neg nuc 11/2014.   Cerebrovascular disease    Cervical spondylosis    Chest pain, unspecified    Chronic fatigue 12/27/2012   Chronic lower back pain    Chronic pain  syndrome    Chronic sinusitis 03/09/2018   Constipation due to pain medication 01/08/2022   Degeneration of lumbar intervertebral disc    Degenerative joint disease 12/27/2012   Essential hypertension 08/20/2021   Gastroesophageal reflux disease 12/27/2012   Generalized anxiety disorder 12/27/2012   resolved per pt as of 12/02/21   History of blood transfusion    w/tonsillectomy; w/spinal fusion (2004)   History of COVID-19 07/17/2020   History of hiatal hernia    History of spinal fusion 12/26/2021   Hyperlipidemia    Hypotension    a. prompting holding of BB.   Idiopathic hypotension 05/13/2016   Neurogenic claudication due to lumbar spinal stenosis 09/18/2021   Non-ST elevation myocardial infarction (NSTEMI) 1999   Osteoarthritis    Palpitations 04/28/2022   Peroneal neuropathy at knee, left 06/02/2022   Postoperative anemia 12/26/2021   Postoperative nausea and vomiting 01/08/2022   Prediabetes 12/27/2012   Premature atrial contraction 07/22/2022   Presence of coronary artery bypass graft stent 12/26/2021   Sinus arrest 12/17/2017   3.2 second sinus pause, aslo PVCs and PACs, asymptomatic, now has loop recorder   Wears glasses      Past Surgical History:  Procedure Laterality Date   APPENDECTOMY  1965   BACK SURGERY     BILATERAL SALPINGOOPHORECTOMY Bilateral 1978   "2 separate ORs; months apart" (12/27/2012)   CARDIAC CATHETERIZATION  2000's   "couple times" (12/27/2012)   CHOLECYSTECTOMY  08/1989   CORONARY ANGIOPLASTY WITH STENT PLACEMENT  08/03/1997; 12/28/2012   1 + 1" (01/19/2013)   CYSTOSCOPY W/ URETERAL STENT PLACEMENT Left 12/06/2021   Procedure: CYSTOSCOPY WITH RETROGRADE PYELOGRAM/URETERAL STENT PLACEMENT;  Surgeon: Rene Paci, MD;  Location: [facility];  Service: Urology;  Laterality: Left;   DILATION AND CURETTAGE OF UTERUS  09/1967   "  after miscarriage" (12/27/2012)   FINGER ARTHROPLASTY Right 12/232011   "ring finger; fused it;  put pin in it; 2nd joint" (12/27/2012)   FRACTIONAL FLOW RESERVE WIRE  12/28/2012   Procedure: FRACTIONAL FLOW RESERVE WIRE;  Surgeon: Lesleigh Noe, MD;  Location: [facility] LAB;  Service: Cardiovascular;;  PROX LAD   GANGLION CYST EXCISION Right 1990's   GUM SURGERY  2011   "tissue transplant; 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" (12/27/2012)   HERNIA REPAIR  02/2002   "ventral" (12/27/2012)   LEFT HEART CATHETERIZATION WITH CORONARY ANGIOGRAM N/A 12/28/2012   Procedure: LEFT HEART CATHETERIZATION WITH CORONARY ANGIOGRAM;  Surgeon: Lesleigh Noe, MD;  Location: [facility] CATH LAB;  Service: Cardiovascular;  Laterality: N/A;   LEFT HEART CATHETERIZATION WITH CORONARY ANGIOGRAM N/A 01/20/2013   Procedure: LEFT HEART CATHETERIZATION WITH CORONARY ANGIOGRAM;  Surgeon: Micheline Chapman, MD;  Location: [facility] CATH LAB;  Service: Cardiovascular;  Laterality: N/A;   LIGAMENT REPAIR Right 1990's   "radial collateral" (12/27/2012)   LUMBAR LAMINECTOMY  1989; 1990's   "twice" (12/27/2012)   MASS EXCISION Left 1990's   "groin" (12/27/2012)   NASAL SEPTUM SURGERY  1969   PARTIAL HYSTERECTOMY  1974   PERCUTANEOUS CORONARY STENT INTERVENTION (PCI-S)  12/28/2012   Procedure: PERCUTANEOUS CORONARY STENT INTERVENTION (PCI-S);  Surgeon: Lesleigh Noe, MD;  Location: [facility] CATH LAB;  Service: Cardiovascular;;  Prox LAD   POSTERIOR LUMBAR FUSION  04/2002   TONSILLECTOMY  1964   TYMPANOPLASTY Bilateral 1966; 2001   "left; right" (12/27/2012)     PREVIOUS MEDICATIONS:   CURRENT MEDICATIONS:  Outpatient Encounter Medications as of 12/16/2022  Medication Sig   acidophilus (RISAQUAD) CAPS capsule Take 1 capsule by mouth daily.   albuterol (VENTOLIN HFA) 108 (90 Base) MCG/ACT inhaler Inhale 2 puffs into the lungs every 6 (six) hours as needed for wheezing or shortness of breath. Uses when sick. She doesn't have to use often per pt on 12/02/21.   aspirin EC 81 MG tablet Take 1 tablet (81 mg total) by mouth daily.  Swallow whole.   b complex vitamins capsule Take 1 capsule by mouth daily.   bisoprolol (ZEBETA) 10 MG tablet TAKE 1 TABLET (10 MG TOTAL) BY MOUTH DAILY AS NEEDED (IF HEART RATE 100 OR GREATER). PATIENT REPORTEDLY TAKES IT FOR PALPITATIONS FOR MANY YEARS   budesonide-formoterol (SYMBICORT) 160-4.5 MCG/ACT inhaler Inhale 2 puffs into the lungs 2 (two) times daily as needed (sob/wheezing).   cetirizine (ZYRTEC) 10 MG tablet Take 10 mg by mouth as needed.   Cholecalciferol (VITAMIN D3 PO) Take 1 tablet by mouth daily.   Coenzyme Q10 (CO Q 10 PO) Take 1 tablet by mouth daily.   estrogen-methylTESTOSTERone (ESTRATEST) 1.25-2.5 MG per tablet Take 1 tablet by mouth at bedtime.   fish oil-omega-3 fatty acids 1000 MG capsule Take 1 g by mouth daily.   fludrocortisone (FLORINEF) 0.1 MG tablet Take 1 tablet (0.1 mg total) by mouth as needed. For episodes of lightheadedness   fluticasone (FLONASE) 50 MCG/ACT nasal spray Place 2 sprays into both nostrils as needed.   Glucosamine 500 MG CAPS Take by mouth. Pt unsure of dosage.   HYDROcodone-acetaminophen (NORCO) 10-325 MG tablet Take 1 tablet by mouth every 6 (six) hours as needed for moderate pain or severe pain. Takes 4 times a day on a daily basis.   meloxicam (MOBIC) 15 MG tablet Take 15 mg by mouth daily as needed for pain.   methocarbamol (  ROBAXIN) 500 MG tablet Take 250 mg by mouth 3 (three) times daily.   nortriptyline (PAMELOR) 50 MG capsule Take 50 mg by mouth at bedtime.   pantoprazole (PROTONIX) 40 MG tablet Take 40 mg by mouth as needed.   rosuvastatin (CRESTOR) 10 MG tablet Take 1 tablet (10 mg total) by mouth at bedtime.   No facility-administered encounter medications on file as of 12/16/2022.     Objective:     PHYSICAL EXAMINATION:    VITALS:   Vitals:   12/16/22 1301  BP: (!) 103/59  Pulse: 76  SpO2: 96%  Weight: 133 lb (60.3 kg)  Height: 5\' 2"  (1.575 m)    GEN:  The patient appears stated age and is in NAD. HEENT:   Normocephalic, atraumatic.   Neurological examination:  General: NAD, well-groomed, appears stated age. Orientation: The patient is alert. Oriented to person, and place. Cranial nerves: There is good facial symmetry. Flat affect. The speech is fluent and clear. No aphasia or dysarthria. Fund of knowledge is appropriate. Recent memory  and remote memory is normal.  Attention and concentration are normal.  Able to name objects and repeat phrases.  Hearing is intact to conversational tone .    Sensation: Sensation is intact to light touch throughout Motor: Strength is at least antigravity x4. DTR's 2/4 in UE/LE, no hyperreflexia is noted     07/03/2014   11:33 AM 07/03/2014    9:47 AM  Montreal Cognitive Assessment   Visuospatial/ Executive (0/5) 3 3  Naming (0/3) 3 3  Attention: Read list of digits (0/2) 2   Attention: Read list of letters (0/1) 1   Attention: Serial 7 subtraction starting at 100 (0/3) 1   Language: Repeat phrase (0/2) 2   Language : Fluency (0/1) 1   Abstraction (0/2) 1   Delayed Recall (0/5) 1   Orientation (0/6) 6   Total 21   Adjusted Score (based on education) 21         No data to display             Movement examination: Tone: There is normal tone in the UE/LE, no cogwheeling. Abnormal movements:  no tremor.  No myoclonus.  No asterixis.   Coordination:  There is no decremation with RAM's. Normal finger to nose  Gait and Station: The patient has no difficulty arising out of a deep-seated chair without the use of the hands.  Good armswing.  The patient's stride length is good.  Gait is cautious and narrow.   Thank you for allowing [place] the opportunity to participate in the care of this nice patient. Please do not hesitate to contact us for any questions or concerns.   Total time spent on today's visit was 25 minutes dedicated to this patient today, preparing to see patient, examining the patient, ordering tests and/or medications and counseling the  patient, documenting clinical information in the EHR or other health record, independently interpreting results and communicating results to the patient/family, discussing treatment and goals, answering patient's questions and coordinating care.  Cc:  Noberto Retort, MD  Marlowe Kays 12/16/2022 1:26 PM

## 2022-12-29 DIAGNOSIS — M47812 Spondylosis without myelopathy or radiculopathy, cervical region: Secondary | ICD-10-CM | POA: Diagnosis not present

## 2023-01-06 DIAGNOSIS — M461 Sacroiliitis, not elsewhere classified: Secondary | ICD-10-CM | POA: Diagnosis not present

## 2023-01-21 DIAGNOSIS — M461 Sacroiliitis, not elsewhere classified: Secondary | ICD-10-CM | POA: Diagnosis not present

## 2023-01-21 DIAGNOSIS — M5116 Intervertebral disc disorders with radiculopathy, lumbar region: Secondary | ICD-10-CM | POA: Diagnosis not present

## 2023-01-21 DIAGNOSIS — Z6824 Body mass index (BMI) 24.0-24.9, adult: Secondary | ICD-10-CM | POA: Diagnosis not present

## 2023-01-21 DIAGNOSIS — M4325 Fusion of spine, thoracolumbar region: Secondary | ICD-10-CM | POA: Diagnosis not present

## 2023-03-23 ENCOUNTER — Telehealth: Payer: Self-pay | Admitting: Cardiology

## 2023-03-23 DIAGNOSIS — R200 Anesthesia of skin: Secondary | ICD-10-CM

## 2023-03-23 NOTE — Telephone Encounter (Signed)
Patient states that she has had the feeling of pin and needle in her head on left side. She she had a sharp pain in her arm and neck as well. Please advise

## 2023-03-23 NOTE — Telephone Encounter (Signed)
Spoke with pt who complains feeling "pins and needles" on the left side of her head as well as arm and neck pain.  Pt states symptoms have since resolved.  Pt denies CP, SOB or dizziness.  She has no current vital signs available.  She reports she does take medication as prescribed but forgot to take her ASA yesterday.  Pt wonders if she should have her carotid arteries checked due to symptoms.    Pt advised to monitor BP and HR daily and continue medications as prescribed.  Reviewed ED precautions.  Will forward to Dr Rosemary Holms for further review and recommendations.  Pt due to be seen again April 2025.

## 2023-03-24 NOTE — Telephone Encounter (Signed)
Reasonable to obtain carotid duplex with diagnosis of left arm numbness. I strongly encourage her to see her Neurologist at the earliest. If she has any recurrent symptoms, recommend urgent ER evaluation to rule put TIA.  Thanks MJP

## 2023-03-24 NOTE — Telephone Encounter (Signed)
Call to patient to relay that Dr. Rosemary Holms recommends carotid scan, patient verbalizes understanding and agrees to plan. Also advised that patient call Neurologist and request appt as soon as possible. Patient verbalizes understanding. Advised I would forward this message to S. Gwynneth Munson, PA-C, the last provider she saw at her neurologist's office. Lastly, I advised patient that per Dr. Rosemary Holms she is to call 911 or present to ER if symptoms return as they could be indicative of TIA or stroke. Provided education on how early treatment has better outcomes in stroke. Patient verbalizes understanding and agrees to plan.

## 2023-03-24 NOTE — Addendum Note (Signed)
Addended by: Loa Socks on: 03/24/2023 09:48 AM   Modules accepted: Orders

## 2023-03-24 NOTE — Telephone Encounter (Signed)
Order for carotids placed with a diagnosis of left arm numbness.   Will send PV Scheduler a staff message to call the pt back to arrange this appt.

## 2023-03-24 NOTE — Telephone Encounter (Signed)
Pts carotid ultrasound scheduled for 04/07/23 at 1000. Pt made aware of appt date and time by [facility] Scheduler.

## 2023-04-07 ENCOUNTER — Ambulatory Visit (HOSPITAL_COMMUNITY)
Admission: RE | Admit: 2023-04-07 | Payer: Medicare HMO | Source: Ambulatory Visit | Attending: Cardiology | Admitting: Cardiology

## 2023-04-22 ENCOUNTER — Ambulatory Visit (HOSPITAL_COMMUNITY)
Admission: RE | Admit: 2023-04-22 | Discharge: 2023-04-22 | Disposition: A | Discharge status: Home / Self Care | Payer: Medicare HMO | Source: Ambulatory Visit | Attending: Cardiology | Admitting: Cardiology

## 2023-04-22 DIAGNOSIS — R200 Anesthesia of skin: Secondary | ICD-10-CM | POA: Insufficient documentation

## 2023-05-20 DIAGNOSIS — E042 Nontoxic multinodular goiter: Secondary | ICD-10-CM | POA: Diagnosis not present

## 2023-05-20 DIAGNOSIS — E041 Nontoxic single thyroid nodule: Secondary | ICD-10-CM | POA: Diagnosis not present

## 2023-05-22 ENCOUNTER — Other Ambulatory Visit: Payer: Self-pay | Admitting: Cardiology

## 2023-07-22 ENCOUNTER — Ambulatory Visit: Payer: Medicare HMO | Admitting: Cardiology

## 2023-07-26 DIAGNOSIS — S199XXA Unspecified injury of neck, initial encounter: Secondary | ICD-10-CM | POA: Diagnosis not present

## 2023-07-26 DIAGNOSIS — S59902A Unspecified injury of left elbow, initial encounter: Secondary | ICD-10-CM | POA: Diagnosis not present

## 2023-07-26 DIAGNOSIS — S29012A Strain of muscle and tendon of back wall of thorax, initial encounter: Secondary | ICD-10-CM | POA: Diagnosis not present

## 2023-07-26 DIAGNOSIS — W182XXA Fall in (into) shower or empty bathtub, initial encounter: Secondary | ICD-10-CM | POA: Diagnosis not present

## 2023-07-26 DIAGNOSIS — M47814 Spondylosis without myelopathy or radiculopathy, thoracic region: Secondary | ICD-10-CM | POA: Diagnosis not present

## 2023-07-26 DIAGNOSIS — S299XXA Unspecified injury of thorax, initial encounter: Secondary | ICD-10-CM | POA: Diagnosis not present

## 2023-07-26 DIAGNOSIS — M546 Pain in thoracic spine: Secondary | ICD-10-CM | POA: Diagnosis not present

## 2023-07-26 DIAGNOSIS — R519 Headache, unspecified: Secondary | ICD-10-CM | POA: Diagnosis not present

## 2023-07-26 DIAGNOSIS — S0990XA Unspecified injury of head, initial encounter: Secondary | ICD-10-CM | POA: Diagnosis not present

## 2023-07-26 DIAGNOSIS — M47812 Spondylosis without myelopathy or radiculopathy, cervical region: Secondary | ICD-10-CM | POA: Diagnosis not present

## 2023-08-19 ENCOUNTER — Other Ambulatory Visit: Payer: Self-pay | Admitting: Cardiology

## 2023-09-12 DIAGNOSIS — E782 Mixed hyperlipidemia: Secondary | ICD-10-CM | POA: Diagnosis not present

## 2023-09-12 DIAGNOSIS — I25119 Atherosclerotic heart disease of native coronary artery with unspecified angina pectoris: Secondary | ICD-10-CM | POA: Diagnosis not present

## 2023-09-12 DIAGNOSIS — J4520 Mild intermittent asthma, uncomplicated: Secondary | ICD-10-CM | POA: Diagnosis not present

## 2023-09-24 DIAGNOSIS — M5416 Radiculopathy, lumbar region: Secondary | ICD-10-CM | POA: Diagnosis not present

## 2023-09-24 DIAGNOSIS — Z1330 Encounter for screening examination for mental health and behavioral disorders, unspecified: Secondary | ICD-10-CM | POA: Diagnosis not present

## 2023-09-24 DIAGNOSIS — M461 Sacroiliitis, not elsewhere classified: Secondary | ICD-10-CM | POA: Diagnosis not present

## 2023-10-06 DIAGNOSIS — Z0000 Encounter for general adult medical examination without abnormal findings: Secondary | ICD-10-CM | POA: Diagnosis not present

## 2023-10-07 DIAGNOSIS — L570 Actinic keratosis: Secondary | ICD-10-CM | POA: Diagnosis not present

## 2023-10-07 DIAGNOSIS — D0462 Carcinoma in situ of skin of left upper limb, including shoulder: Secondary | ICD-10-CM | POA: Diagnosis not present

## 2023-10-07 DIAGNOSIS — L820 Inflamed seborrheic keratosis: Secondary | ICD-10-CM | POA: Diagnosis not present

## 2023-10-07 DIAGNOSIS — L821 Other seborrheic keratosis: Secondary | ICD-10-CM | POA: Diagnosis not present

## 2023-10-07 DIAGNOSIS — D1801 Hemangioma of skin and subcutaneous tissue: Secondary | ICD-10-CM | POA: Diagnosis not present

## 2023-10-07 DIAGNOSIS — D2271 Melanocytic nevi of right lower limb, including hip: Secondary | ICD-10-CM | POA: Diagnosis not present

## 2023-10-07 DIAGNOSIS — L814 Other melanin hyperpigmentation: Secondary | ICD-10-CM | POA: Diagnosis not present

## 2023-10-07 DIAGNOSIS — Z85828 Personal history of other malignant neoplasm of skin: Secondary | ICD-10-CM | POA: Diagnosis not present

## 2023-10-12 DIAGNOSIS — I25119 Atherosclerotic heart disease of native coronary artery with unspecified angina pectoris: Secondary | ICD-10-CM | POA: Diagnosis not present

## 2023-10-12 DIAGNOSIS — E782 Mixed hyperlipidemia: Secondary | ICD-10-CM | POA: Diagnosis not present

## 2023-10-12 DIAGNOSIS — J4520 Mild intermittent asthma, uncomplicated: Secondary | ICD-10-CM | POA: Diagnosis not present

## 2023-11-10 DIAGNOSIS — H524 Presbyopia: Secondary | ICD-10-CM | POA: Diagnosis not present

## 2023-11-10 DIAGNOSIS — H2513 Age-related nuclear cataract, bilateral: Secondary | ICD-10-CM | POA: Diagnosis not present

## 2023-11-12 DIAGNOSIS — J4520 Mild intermittent asthma, uncomplicated: Secondary | ICD-10-CM | POA: Diagnosis not present

## 2023-11-12 DIAGNOSIS — E782 Mixed hyperlipidemia: Secondary | ICD-10-CM | POA: Diagnosis not present

## 2023-11-12 DIAGNOSIS — I25119 Atherosclerotic heart disease of native coronary artery with unspecified angina pectoris: Secondary | ICD-10-CM | POA: Diagnosis not present

## 2023-11-22 DIAGNOSIS — S50861A Insect bite (nonvenomous) of right forearm, initial encounter: Secondary | ICD-10-CM | POA: Diagnosis not present

## 2023-11-22 DIAGNOSIS — R21 Rash and other nonspecific skin eruption: Secondary | ICD-10-CM | POA: Diagnosis not present

## 2023-11-22 DIAGNOSIS — W57XXXA Bitten or stung by nonvenomous insect and other nonvenomous arthropods, initial encounter: Secondary | ICD-10-CM | POA: Diagnosis not present

## 2023-12-08 DIAGNOSIS — H60531 Acute contact otitis externa, right ear: Secondary | ICD-10-CM | POA: Diagnosis not present

## 2023-12-08 DIAGNOSIS — H6121 Impacted cerumen, right ear: Secondary | ICD-10-CM | POA: Diagnosis not present

## 2023-12-08 DIAGNOSIS — J340 Abscess, furuncle and carbuncle of nose: Secondary | ICD-10-CM | POA: Diagnosis not present

## 2023-12-08 DIAGNOSIS — J320 Chronic maxillary sinusitis: Secondary | ICD-10-CM | POA: Diagnosis not present

## 2023-12-13 DIAGNOSIS — I25119 Atherosclerotic heart disease of native coronary artery with unspecified angina pectoris: Secondary | ICD-10-CM | POA: Diagnosis not present

## 2023-12-13 DIAGNOSIS — J4520 Mild intermittent asthma, uncomplicated: Secondary | ICD-10-CM | POA: Diagnosis not present

## 2023-12-13 DIAGNOSIS — E782 Mixed hyperlipidemia: Secondary | ICD-10-CM | POA: Diagnosis not present

## 2024-02-11 DIAGNOSIS — H02402 Unspecified ptosis of left eyelid: Secondary | ICD-10-CM | POA: Diagnosis not present

## 2024-02-15 ENCOUNTER — Ambulatory Visit: Admitting: Cardiology

## 2024-02-18 ENCOUNTER — Other Ambulatory Visit: Payer: Self-pay | Admitting: Family Medicine

## 2024-02-18 DIAGNOSIS — Z1231 Encounter for screening mammogram for malignant neoplasm of breast: Secondary | ICD-10-CM

## 2024-03-11 DIAGNOSIS — J029 Acute pharyngitis, unspecified: Secondary | ICD-10-CM | POA: Diagnosis not present

## 2024-03-11 DIAGNOSIS — R509 Fever, unspecified: Secondary | ICD-10-CM | POA: Diagnosis not present

## 2024-03-11 DIAGNOSIS — R0981 Nasal congestion: Secondary | ICD-10-CM | POA: Diagnosis not present

## 2024-03-11 DIAGNOSIS — R051 Acute cough: Secondary | ICD-10-CM | POA: Diagnosis not present

## 2024-03-16 ENCOUNTER — Ambulatory Visit: Attending: Cardiology | Admitting: Cardiology

## 2024-03-16 ENCOUNTER — Ambulatory Visit
Admission: RE | Admit: 2024-03-16 | Discharge: 2024-03-16 | Disposition: A | Source: Ambulatory Visit | Attending: Family Medicine | Admitting: Family Medicine

## 2024-03-16 ENCOUNTER — Encounter: Payer: Self-pay | Admitting: Cardiology

## 2024-03-16 VITALS — BP 116/50 | HR 70 | Ht 62.5 in | Wt 134.0 lb

## 2024-03-16 DIAGNOSIS — E782 Mixed hyperlipidemia: Secondary | ICD-10-CM | POA: Diagnosis not present

## 2024-03-16 DIAGNOSIS — I2510 Atherosclerotic heart disease of native coronary artery without angina pectoris: Secondary | ICD-10-CM

## 2024-03-16 DIAGNOSIS — Z1231 Encounter for screening mammogram for malignant neoplasm of breast: Secondary | ICD-10-CM

## 2024-03-16 MED ORDER — ROSUVASTATIN CALCIUM 20 MG PO TABS: 20.0000 mg | ORAL_TABLET | Freq: Every day | ORAL | 3 refills | Status: AC

## 2024-03-16 NOTE — Patient Instructions (Signed)
 Medication Instructions:  INCREASED Crestor  to 20 mg daily   *If you need a refill on your cardiac medications before your next appointment, please call your pharmacy*   Follow-Up: At [facility], you and your health needs are our priority.  As part of our continuing mission to provide you with exceptional heart care, our providers are all part of one team.  This team includes your primary Cardiologist (physician) and Advanced Practice Providers or APPs (Physician Assistants and Nurse Practitioners) who all work together to provide you with the care you need, when you need it.  Your next appointment:   1 year(s)  Provider:   Newman JINNY Lawrence, MD

## 2024-03-16 NOTE — Progress Notes (Signed)
 Cardiology Office Note:  .   Date:  03/16/2024  ID:  Briana Bowers, DOB Aug 03, 1950, MRN [id number] PCP: Arloa Elsie SAUNDERS, MD  [facility] Providers Cardiologist:  Newman Lawrence, MD PCP: Arloa Elsie SAUNDERS, MD  Chief Complaint  Patient presents with   Coronary Artery Disease     Briana Bowers is a 73 y.o. female with hypertension, hyperlipidemia, CAD (PCI to RCA 1999, LAD 2014), h/o autonomic dysfunction   Discussed the use of AI scribe software for clinical note transcription with the patient, who gave verbal consent to proceed.  History of Present Illness Briana Bowers is a 73 year old female with coronary artery disease who presents for follow-up regarding her cardiovascular health.  She reports improved exercise tolerance and is using an elliptical, walking, and doing yard work without chest pain or shortness of breath. She denies palpitations, heart racing, or skipped beats.  Within the last week she noted one brief episode of irregular heartbeat described as a weird drop in heart rate while checking her pulse, which resolved spontaneously and has not recurred.  She takes Crestor  10 mg daily.      Vitals:   03/16/24 1557  BP: (!) 116/50  Pulse: 70  SpO2: 98%      Review of Systems  Cardiovascular:  Negative for chest pain, dyspnea on exertion, leg swelling, palpitations and syncope.        Studies Reviewed: SABRA        EKG 03/16/2024: Normal sinus rhythm Possible Left atrial enlargement Incomplete right bundle branch block Anterior infarct (cited on or before 20-Jan-2013) When compared with ECG of 20-May-2017 20:15, No significant change was found    Mobile cardiac telemetry 13 days 04/28/2022 - 05/12/2022: Dominant rhythm: Sinus. HR 51-116 bpm. Avg HR 76 bpm. 0 episodes of SV. 5% isolated SVE, <1% couplet/triplets. 0 episodes of VT. <1% isolated VE, couplets. No atrial fibrillation/atrial flutter/SVT/VT/high grade AV block, sinus pause  >3sec noted. 10 patient triggered events, correlated with SVE/VE.   SPECT MPI 08/2021:  1. Normal ECG stress test.    2. Myocardial perfusion imaging is normal.    3. Overall left ventricular systolic function was normal without regional  wall motion-abnormalities. Ejection fraction 72 %.    Vascular US  04/2023: Right Carotid: Velocities in the right ICA are consistent with a 1-39%  stenosis.  Left Carotid: Velocities in the left ICA are consistent with a 1-39%  stenosis.  Vertebrals: Bilateral vertebral arteries demonstrate antegrade flow.  Subclavians: Normal flow hemodynamics were seen in bilateral subclavian               arteries.  Incidental findings: Right upper pole thyroid  nodule with increase  vascularity, measuring 1.0 x .8 x 1.1 cm.    Echocardiogram 2020:   1. Normal left ventricular systolic function, ejection fraction 65%.    2. Diastolic dysfunction - grade I (normal filling pressures).    3. No significant valvular abnormalities.    4. No evidence to suggest cardiac tamponade.     Labs 09/2023: Chol 142, TG 98, HDL 49, LDL 75 HbA1C 5.9% Hb 13.6 TSH 2.7  2023: Cr 0.8  Physical Exam Vitals and nursing note reviewed.  Constitutional:      General: She is not in acute distress. Neck:     Vascular: No JVD.  Cardiovascular:     Rate and Rhythm: Normal rate and regular rhythm.     Heart sounds: Normal heart sounds. No murmur heard. Pulmonary:  Effort: Pulmonary effort is normal.     Breath sounds: Normal breath sounds. No wheezing or rales.  Musculoskeletal:     Right lower leg: No edema.     Left lower leg: No edema.      VISIT DIAGNOSES:   ICD-10-CM   1. Coronary artery disease involving native coronary artery of native heart without angina pectoris  I25.10 EKG 12-Lead    2. Mixed hyperlipidemia  E78.2 EKG 12-Lead       Briana Bowers is a 73 y.o. female with hypertension, hyperlipidemia, CAD (PCI to RCA 1999, LAD 2014), h/o autonomic  dysfunction   Assessment & Plan  CAD: No angina symptoms No ischemia on nuclear stress test (2023) Continue Aspirin , bystolic. LDL 75., increase Crestor  to 20 mg daily.   Hypertension: Controlled  Mixed hyperlipidemia: As above       Meds ordered this encounter  Medications   rosuvastatin  (CRESTOR ) 20 MG tablet    Sig: Take 1 tablet (20 mg total) by mouth at bedtime.    Dispense:  90 tablet    Refill:  3     F/u in 1 year  Signed, Newman JINNY Lawrence, MD

## 2024-03-17 DIAGNOSIS — J320 Chronic maxillary sinusitis: Secondary | ICD-10-CM | POA: Diagnosis not present

## 2024-03-17 DIAGNOSIS — J324 Chronic pansinusitis: Secondary | ICD-10-CM | POA: Diagnosis not present

## 2024-03-17 DIAGNOSIS — H6121 Impacted cerumen, right ear: Secondary | ICD-10-CM | POA: Diagnosis not present

## 2024-03-18 ENCOUNTER — Telehealth: Payer: Self-pay | Admitting: Cardiology

## 2024-03-18 NOTE — Telephone Encounter (Signed)
 She forgot to ask Dr. [place] about her friend's DBP and wanted to know if 101 avg all the time is normal.  Patient advised that although that is an elevated number, we cannot advise if ok for that person as we do not know them or why their doctor told them it was ok to maintain a DBP of 101.  Advised to have that person discuss more with their doctor.

## 2024-03-18 NOTE — Telephone Encounter (Signed)
 Patient was a call back to discuss general BP questions.

## 2024-04-01 DIAGNOSIS — Z1212 Encounter for screening for malignant neoplasm of rectum: Secondary | ICD-10-CM | POA: Diagnosis not present

## 2024-04-01 DIAGNOSIS — Z1211 Encounter for screening for malignant neoplasm of colon: Secondary | ICD-10-CM | POA: Diagnosis not present

## 2024-04-07 LAB — COLOGUARD: COLOGUARD: NEGATIVE
